# Patient Record
Sex: Female | Born: 1955 | Race: White | Hispanic: No | Marital: Married | Smoking: Never smoker
Health system: Southern US, Community
[De-identification: ages and names within clinical notes are randomized; demographics above are authoritative.]

## PROBLEM LIST (undated history)

## (undated) DIAGNOSIS — R7303 Prediabetes: Secondary | ICD-10-CM

## (undated) DIAGNOSIS — I10 Essential (primary) hypertension: Secondary | ICD-10-CM

## (undated) DIAGNOSIS — I4891 Unspecified atrial fibrillation: Secondary | ICD-10-CM

## (undated) DIAGNOSIS — E785 Hyperlipidemia, unspecified: Secondary | ICD-10-CM

## (undated) DIAGNOSIS — E559 Vitamin D deficiency, unspecified: Secondary | ICD-10-CM

## (undated) HISTORY — PX: OTHER SURGICAL HISTORY: SHX169

## (undated) HISTORY — PX: TUBAL LIGATION: SHX77

---

## 2005-01-15 ENCOUNTER — Other Ambulatory Visit: Admission: RE | Admit: 2005-01-15 | Discharge: 2005-01-15 | Payer: Self-pay | Admitting: Obstetrics and Gynecology

## 2009-07-26 ENCOUNTER — Encounter (INDEPENDENT_AMBULATORY_CARE_PROVIDER_SITE_OTHER): Payer: Self-pay | Admitting: *Deleted

## 2009-08-26 ENCOUNTER — Encounter (INDEPENDENT_AMBULATORY_CARE_PROVIDER_SITE_OTHER): Payer: Self-pay | Admitting: *Deleted

## 2009-08-29 ENCOUNTER — Ambulatory Visit: Payer: Self-pay | Admitting: Internal Medicine

## 2009-09-06 ENCOUNTER — Ambulatory Visit: Payer: Self-pay | Admitting: Internal Medicine

## 2009-09-12 ENCOUNTER — Encounter: Payer: Self-pay | Admitting: Internal Medicine

## 2010-07-20 NOTE — Letter (Signed)
Summary: Previsit letter  [facility]  [address] [place], [place] [address]   Phone: [phone]  Fax: [phone]       07/26/2009 MRN: [id number]  Madison Whitehead [address] [place], [place]  [address]  Dear Ms. Madison Whitehead,  Welcome to the Gastroenterology Division at [facility].    You are scheduled to see a nurse for your pre-procedure visit on 08-29-09 at 8:00a.m. on the 3rd floor at [facility], [address]  We ask that you try to arrive at our office 15 minutes prior to your appointment time to allow for check-in.  Your nurse visit will consist of discussing your medical and surgical history, your immediate family medical history, and your medications.    Please bring a complete list of all your medications or, if you prefer, bring the medication bottles and we will list them.  We will need to be aware of both prescribed and over the counter drugs.  We will need to know exact dosage information as well.  If you are on blood thinners (Coumadin, Plavix, Aggrenox, Ticlid, etc.) please call our office today/prior to your appointment, as we need to consult with your physician about holding your medication.   Please be prepared to read and sign documents such as consent forms, a financial agreement, and acknowledgement forms.  If necessary, and with your consent, a friend or relative is welcome to sit-in on the nurse visit with you.  Please bring your insurance card so that we may make a copy of it.  If your insurance requires a referral to see a specialist, please bring your referral form from your primary care physician.  No co-pay is required for this nurse visit.     If you cannot keep your appointment, please call [phone] to cancel or reschedule prior to your appointment date.  This allows [place] the opportunity to schedule an appointment for another patient in need of care.    Thank you for choosing [facility] for your medical  needs.  We appreciate the opportunity to care for you.  Please visit [place] at our website  to learn more about our practice.                     Sincerely.                                                                                                                   The Gastroenterology Division

## 2010-07-20 NOTE — Letter (Signed)
Summary: Patient Notice-Hyperplastic Polyps  [facility]  [address] [place], [place] [address]   Phone: [phone]  Fax: [phone]        September 12, 2009 MRN: [id number]    Madison Whitehead [address] [place], [place]  [address]    Dear Ms. Marvetta Gibbons,  I am pleased to inform you that the colon polyp removed during your recent colonoscopy was found to be hyperplastic.  These types of polyps are NOT pre-cancerous.  It is therefore my recommendation that you have a repeat colonoscopy examination in 10 years for routine colorectal cancer screening.  Should you develop new or worsening symptoms of abdominal pain, bowel habit changes or bleeding from the rectum or bowels, please schedule an evaluation with either your primary care physician or with me.  Please call us if you are having persistent problems or have questions about your condition that have not been fully answered at this time.   Sincerely,  Iva Boop MD, [facility] This letter has been electronically signed by your physician.  Appended Document: Patient Notice-Hyperplastic Polyps letter mailed 3.28.11

## 2010-07-20 NOTE — Procedures (Signed)
Summary: Colonoscopy  Patient: [name] Note: All result statuses are Final unless otherwise noted.  Tests: (1) Colonoscopy (COL)   COL Colonoscopy           DONE     [facility]     [address]     [place], [place]  [address]           COLONOSCOPY PROCEDURE REPORT           PATIENT:  Madison Whitehead, Madison Whitehead  MR#:  [id number]     BIRTHDATE:  04/15/1956, 53 yrs. old  GENDER:  female           ENDOSCOPIST:  Iva Boop, MD, [facility]     Referred by:  Marisue Brooklyn, D.O.           PROCEDURE DATE:  09/06/2009     PROCEDURE:  Colonoscopy with snare polypectomy     ASA CLASS:  Class II     INDICATIONS:  Routine Risk Screening           MEDICATIONS:   Fentanyl 75 mcg IV, Versed 8 mg IV           DESCRIPTION OF PROCEDURE:   After the risks benefits and     alternatives of the procedure were thoroughly explained, informed     consent was obtained.  Digital rectal exam was performed and     revealed no abnormalities.   The LB CF-[id number] endoscope     was introduced through the anus and advanced to the cecum, which     was identified by both the appendix and ileocecal valve, without     limitations.  The quality of the prep was excellent, using     MoviPrep.  The instrument was then slowly withdrawn as the colon     was fully examined.     Insertion: 5:24 minutes Withdrawal: 9:18 minutes     <<PROCEDUREIMAGES>>           FINDINGS:  A diminutive polyp was found in the left colon. It was     4 mm in size. Polyp was snared without cautery. Retrieval was     successful. snare polyp  This was otherwise a normal examination     of the colon.   Retroflexed views in the rectum revealed no     abnormalities.    The scope was then withdrawn from the patient     and the procedure completed.           COMPLICATIONS:  None           ENDOSCOPIC IMPRESSION:     1) 4 mm diminutive polyp in the left colon - removed     2) Otherwise normal examination            RECOMMENDATIONS:     1) Repeat colonoscopy in 5 years if polyp adenomatous; otherwise     10 years           REPEAT EXAM:  In for Colonoscopy, pending biopsy results.           Iva Boop, MD, [facility]           CC:  Marisue Brooklyn, DO     The Patient           n.     Rosalie Doctor:   Iva Boop at 09/06/2009 09:10 AM           [name], [id number]  Note:  An exclamation mark (!) indicates a result that was not dispersed into the flowsheet. Document Creation Date: 09/06/2009 9:10 AM _______________________________________________________________________  (1) Order result status: Final Collection or observation date-time: 09/06/2009 09:01 Requested date-time:  Receipt date-time:  Reported date-time:  Referring Physician:   Ordering Physician: Stan Head [phone]) Specimen Source:  Source: Launa Grill Order Number: [id number] Lab site:   Appended Document: Colonoscopy     Procedures Next Due Date:    Colonoscopy: 09/2019

## 2010-07-20 NOTE — Letter (Signed)
Summary: [facility] Instructions  [facility]  [address] [place], [place] [address]   Phone: [phone]  Fax: [phone]       Madison Whitehead    03/05/1956    MRN: [id number]        Procedure Day /Date:Tuesday 09/06/09     Arrival Time: 7:30 am     Procedure Time: 8:30 am     Location of Procedure:                    _x_  [facility] (4th Floor)   PREPARATION FOR COLONOSCOPY WITH MOVIPREP   Starting 5 days prior to your procedure 09/01/09 do not eat nuts, seeds, popcorn, corn, beans, peas,  salads, or any raw vegetables.  Do not take any fiber supplements (e.g. Metamucil, Citrucel, and Benefiber).  THE DAY BEFORE YOUR PROCEDURE         DATE:  09/05/09  DAY: monday  1.  Drink clear liquids the entire day-NO SOLID FOOD  2.  Do not drink anything colored red or purple.  Avoid juices with pulp.  No orange juice.  3.  Drink at least 64 oz. (8 glasses) of fluid/clear liquids during the day to prevent dehydration and help the prep work efficiently.  CLEAR LIQUIDS INCLUDE: Water Jello Ice Popsicles Tea (sugar ok, no milk/cream) Powdered fruit flavored drinks Coffee (sugar ok, no milk/cream) Gatorade Juice: apple, white grape, white cranberry  Lemonade Clear bullion, consomm, broth Carbonated beverages (any kind) Strained chicken noodle soup Hard Candy                             4.  In the morning, mix first dose of MoviPrep solution:    Empty 1 Pouch A and 1 Pouch B into the disposable container    Add lukewarm drinking water to the top line of the container. Mix to dissolve    Refrigerate (mixed solution should be used within 24 hrs)  5.  Begin drinking the prep at 5:00 p.m. The MoviPrep container is divided by 4 marks.   Every 15 minutes drink the solution down to the next mark (approximately 8 oz) until the full liter is complete.   6.  Follow completed prep with 16 oz of clear liquid of your choice (Nothing red or purple).   Continue to drink clear liquids until bedtime.  7.  Before going to bed, mix second dose of MoviPrep solution:    Empty 1 Pouch A and 1 Pouch B into the disposable container    Add lukewarm drinking water to the top line of the container. Mix to dissolve    Refrigerate  THE DAY OF YOUR PROCEDURE      DATE: 09/06/09  DAY: Tuesday  Beginning at  3:30 a.m. (5 hours before procedure):        1. Every 15 minutes, drink the solution down to the next mark (approx 8 oz) until the full liter is complete.  2. Follow completed prep with 16 oz. of clear liquid of your choice.    3. You may drink clear liquids until 6:30 am  (2 HOURS BEFORE PROCEDURE).   MEDICATION INSTRUCTIONS  Unless otherwise instructed, you should take regular prescription medications with a small sip of water   as early as possible the morning of your procedure.    Additional medication instructions: Hold HCTZ the morning of procedure.  OTHER INSTRUCTIONS  You will need a responsible adult at least 56 years of age to accompany you and drive you home.   This person must remain in the waiting room during your procedure.  Wear loose fitting clothing that is easily removed.  Leave jewelry and other valuables at home.  However, you may wish to bring a book to read or  an iPod/MP3 player to listen to music as you wait for your procedure to start.  Remove all body piercing jewelry and leave at home.  Total time from sign-in until discharge is approximately 2-3 hours.  You should go home directly after your procedure and rest.  You can resume normal activities the  day after your procedure.  The day of your procedure you should not:   Drive   Make legal decisions   Operate machinery   Drink alcohol   Return to work  You will receive specific instructions about eating, activities and medications before you leave.    The above instructions have been reviewed and explained to me by   Wyona Almas RN  March 56, 2011 8:22 AM     I fully understand and can verbalize these instructions _____________________________ Date _________

## 2010-07-20 NOTE — Miscellaneous (Signed)
Summary: lec pREVISIT/PREP  Clinical Lists Changes  Medications: Added new medication of MOVIPREP 100 GM  SOLR (PEG-KCL-NACL-NASULF-NA ASC-C) As per prep instructions. - Signed Rx of MOVIPREP 100 GM  SOLR (PEG-KCL-NACL-NASULF-NA ASC-C) As per prep instructions.;  #1 x 0;  Signed;  Entered by: Wyona Almas RN;  Authorized by: Iva Boop MD, [facility];  Method used: [facility]  [id number] [address], [place], [place], [place]  [address], Ph: [phone], Fax: [phone] Allergies: Added new allergy or adverse reaction of PENICILLIN Added new allergy or adverse reaction of CODEINE Observations: Added new observation of NKA: F (08/29/2009 7:54)    Prescriptions: MOVIPREP 100 GM  SOLR (PEG-KCL-NACL-NASULF-NA ASC-C) As per prep instructions.  #1 x 0   Entered by:   Wyona Almas RN   Authorized by:   Iva Boop MD, [facility]   Signed by:   Wyona Almas RN on 08/29/2009   Method used:   [facility]  [address] [id number]       [address]       [place], [place]  [address]       Ph: [phone]       Fax: [phone]   RxID:   [id number]

## 2010-09-18 ENCOUNTER — Inpatient Hospital Stay (INDEPENDENT_AMBULATORY_CARE_PROVIDER_SITE_OTHER)
Admission: RE | Admit: 2010-09-18 | Discharge: 2010-09-18 | Disposition: A | Discharge status: Home / Self Care | Payer: BC Managed Care – PPO | Source: Ambulatory Visit | Attending: Family Medicine | Admitting: Family Medicine

## 2010-09-18 DIAGNOSIS — T148XXA Other injury of unspecified body region, initial encounter: Secondary | ICD-10-CM

## 2010-09-28 ENCOUNTER — Inpatient Hospital Stay (HOSPITAL_COMMUNITY)
Admission: RE | Admit: 2010-09-28 | Discharge: 2010-09-28 | Disposition: A | Discharge status: Home / Self Care | Payer: BC Managed Care – PPO | Source: Ambulatory Visit

## 2011-07-05 ENCOUNTER — Other Ambulatory Visit: Payer: Self-pay | Admitting: Obstetrics and Gynecology

## 2011-07-12 ENCOUNTER — Other Ambulatory Visit: Payer: Self-pay | Admitting: Obstetrics and Gynecology

## 2011-07-12 DIAGNOSIS — R928 Other abnormal and inconclusive findings on diagnostic imaging of breast: Secondary | ICD-10-CM

## 2011-07-19 ENCOUNTER — Ambulatory Visit
Admission: RE | Admit: 2011-07-19 | Discharge: 2011-07-19 | Disposition: A | Discharge status: Home / Self Care | Payer: BC Managed Care – PPO | Source: Ambulatory Visit | Attending: Obstetrics and Gynecology | Admitting: Obstetrics and Gynecology

## 2011-07-19 DIAGNOSIS — R928 Other abnormal and inconclusive findings on diagnostic imaging of breast: Secondary | ICD-10-CM

## 2013-06-05 ENCOUNTER — Encounter: Payer: Self-pay | Admitting: Internal Medicine

## 2015-04-22 ENCOUNTER — Encounter: Payer: Self-pay | Admitting: Internal Medicine

## 2015-07-05 ENCOUNTER — Other Ambulatory Visit: Payer: Self-pay | Admitting: Obstetrics and Gynecology

## 2015-07-06 LAB — CYTOLOGY - PAP

## 2015-08-05 ENCOUNTER — Encounter: Payer: Self-pay | Admitting: Internal Medicine

## 2015-08-05 ENCOUNTER — Ambulatory Visit (INDEPENDENT_AMBULATORY_CARE_PROVIDER_SITE_OTHER): Payer: BLUE CROSS/BLUE SHIELD | Admitting: Internal Medicine

## 2015-08-05 VITALS — BP 152/98 | HR 64 | Temp 97.0°F | Resp 16 | Ht 66.25 in | Wt 178.3 lb

## 2015-08-05 DIAGNOSIS — Z111 Encounter for screening for respiratory tuberculosis: Secondary | ICD-10-CM

## 2015-08-05 DIAGNOSIS — Z0000 Encounter for general adult medical examination without abnormal findings: Secondary | ICD-10-CM

## 2015-08-05 DIAGNOSIS — E559 Vitamin D deficiency, unspecified: Secondary | ICD-10-CM | POA: Diagnosis not present

## 2015-08-05 DIAGNOSIS — I10 Essential (primary) hypertension: Secondary | ICD-10-CM | POA: Diagnosis not present

## 2015-08-05 DIAGNOSIS — Z79899 Other long term (current) drug therapy: Secondary | ICD-10-CM | POA: Diagnosis not present

## 2015-08-05 DIAGNOSIS — E782 Mixed hyperlipidemia: Secondary | ICD-10-CM | POA: Insufficient documentation

## 2015-08-05 DIAGNOSIS — R5383 Other fatigue: Secondary | ICD-10-CM

## 2015-08-05 DIAGNOSIS — R7303 Prediabetes: Secondary | ICD-10-CM

## 2015-08-05 DIAGNOSIS — Z1212 Encounter for screening for malignant neoplasm of rectum: Secondary | ICD-10-CM

## 2015-08-05 DIAGNOSIS — Z0001 Encounter for general adult medical examination with abnormal findings: Secondary | ICD-10-CM

## 2015-08-05 DIAGNOSIS — R7309 Other abnormal glucose: Secondary | ICD-10-CM | POA: Insufficient documentation

## 2015-08-05 DIAGNOSIS — E785 Hyperlipidemia, unspecified: Secondary | ICD-10-CM

## 2015-08-05 LAB — BASIC METABOLIC PANEL WITH GFR
BUN: 19 mg/dL (ref 7–25)
CHLORIDE: 103 mmol/L (ref 98–110)
CO2: 27 mmol/L (ref 20–31)
CREATININE: 0.92 mg/dL (ref 0.50–0.99)
Calcium: 9.8 mg/dL (ref 8.6–10.4)
GFR, EST AFRICAN AMERICAN: 78 mL/min (ref >=60)
GFR, Est Non African American: 68 mL/min (ref >=60)
Glucose, Bld: 85 mg/dL (ref 65–99)
Potassium: 4.3 mmol/L (ref 3.5–5.3)
SODIUM: 139 mmol/L (ref 135–146)

## 2015-08-05 LAB — CBC WITH DIFFERENTIAL/PLATELET
Basophils Absolute: 0 10*3/uL (ref 0.0–0.1)
Basophils Relative: 0 % (ref 0–1)
Eosinophils Absolute: 0.1 10*3/uL (ref 0.0–0.7)
Eosinophils Relative: 2 % (ref 0–5)
HEMATOCRIT: 40.9 % (ref 36.0–46.0)
HEMOGLOBIN: 13.9 g/dL (ref 12.0–15.0)
LYMPHS ABS: 2.3 10*3/uL (ref 0.7–4.0)
LYMPHS PCT: 40 % (ref 12–46)
MCH: 31.5 pg (ref 26.0–34.0)
MCHC: 34 g/dL (ref 30.0–36.0)
MCV: 92.7 fL (ref 78.0–100.0)
MONO ABS: 0.6 10*3/uL (ref 0.1–1.0)
MONOS PCT: 10 % (ref 3–12)
MPV: 10.3 fL (ref 8.6–12.4)
Neutro Abs: 2.7 10*3/uL (ref 1.7–7.7)
Neutrophils Relative %: 48 % (ref 43–77)
Platelets: 226 10*3/uL (ref 150–400)
RBC: 4.41 MIL/uL (ref 3.87–5.11)
RDW: 13.5 % (ref 11.5–15.5)
WBC: 5.7 10*3/uL (ref 4.0–10.5)

## 2015-08-05 LAB — HEMOGLOBIN A1C
Hgb A1c MFr Bld: 5.6 % (ref <5.7)
Mean Plasma Glucose: 114 mg/dL (ref <117)

## 2015-08-05 LAB — LIPID PANEL
CHOL/HDL RATIO: 4.1 ratio (ref <=5.0)
CHOLESTEROL: 278 mg/dL — AB (ref 125–200)
HDL: 68 mg/dL (ref >=46)
LDL Cholesterol: 193 mg/dL — ABNORMAL HIGH (ref <130)
TRIGLYCERIDES: 86 mg/dL (ref <150)
VLDL: 17 mg/dL (ref <30)

## 2015-08-05 LAB — IRON AND TIBC
%SAT: 26 % (ref 11–50)
IRON: 85 ug/dL (ref 45–160)
TIBC: 332 ug/dL (ref 250–450)
UIBC: 247 ug/dL (ref 125–400)

## 2015-08-05 LAB — HEPATIC FUNCTION PANEL
ALT: 17 U/L (ref 6–29)
AST: 23 U/L (ref 10–35)
Albumin: 4.6 g/dL (ref 3.6–5.1)
Alkaline Phosphatase: 101 U/L (ref 33–130)
BILIRUBIN DIRECT: 0.1 mg/dL (ref <=0.2)
BILIRUBIN TOTAL: 0.6 mg/dL (ref 0.2–1.2)
Indirect Bilirubin: 0.5 mg/dL (ref 0.2–1.2)
Total Protein: 7.3 g/dL (ref 6.1–8.1)

## 2015-08-05 LAB — VITAMIN B12: Vitamin B-12: 390 pg/mL (ref 200–1100)

## 2015-08-05 LAB — MAGNESIUM: MAGNESIUM: 2 mg/dL (ref 1.5–2.5)

## 2015-08-05 LAB — TSH: TSH: 1.41 mIU/L

## 2015-08-05 NOTE — Progress Notes (Signed)
Patient ID: Madison Whitehead, female   DOB: February 03, 1956, 60 y.o.   MRN: [id number]  Annual Screening/Preventative Visit And Comprehensive Evaluation &  Examination  This very nice 60 y.o. MWF presents for presents for a Wellness/Preventative Visit & comprehensive evaluation and management of multiple medical co-morbidities.  Patient has been followed for HTN,Hyperlipidemia, Vitamin D Deficiency& screened for Prediabetes, .    Labile HTN predates since 2007 and then was treated with HCTZ in 2009 which she later self-d/c'd because of her medication phobias. Patient's BP has not been monitored at home and patient was last seen here about 3 years ago. patient denies any cardiac symptoms as chest pain, palpitations, shortness of breath, dizziness or ankle swelling. Today's BP is 152/98    Patient's hyperlipidemia was controlled with diet & Crestor which she self d/c'd. Patient denies myalgias or other medication SE's. Last lipids were TC 181, TG 121, HDL 61 & LDL 96 in Sept 2014.    Patient has is screened proactively for  Prediabetes and patient denies reactive hypoglycemic symptoms, visual blurring, diabetic polys, or paresthesias. Last A1c was 5.4% in Sept 2014.   Finally, patient has history of Vitamin D Deficiency of "10" in 2008 and last Vitamin D was 56 in Mar 2014.     No current outpatient prescriptions on file prior to visit.   No current facility-administered medications on file prior to visit.   Allergies  Allergen Reactions  . Codeine     REACTION: TREMORS/NERVOUS  . Penicillins     REACTION: RASH/SWELLING   No past medical history on file. Health Maintenance  Topic Date Due  . Hepatitis C Screening  09/12/1955  . HIV Screening  04/11/1970  . TETANUS/TDAP  04/11/1974  . MAMMOGRAM  07/18/2013  . INFLUENZA VACCINE  01/17/2015  . ZOSTAVAX  04/12/2015  . PAP SMEAR  07/04/2018  . COLONOSCOPY  09/07/2019    There is no immunization history on file for this patient. No past  surgical history on file. No family history on file. Social History  Substance Use Topics  . Smoking status: Never Smoker   . Smokeless tobacco: None  . Alcohol Use: 0.0 oz/week    0 Standard drinks or equivalent per week     Comment: social    ROS Constitutional: Denies fever, chills, weight loss/gain, headaches, insomnia,  night sweats, and change in appetite. Does c/o fatigue. Eyes: Denies redness, blurred vision, diplopia, discharge, itchy, watery eyes.  ENT: Denies discharge, congestion, post nasal drip, epistaxis, sore throat, earache, hearing loss, dental pain, Tinnitus, Vertigo, Sinus pain, snoring.  Cardio: Denies chest pain, palpitations, irregular heartbeat, syncope, dyspnea, diaphoresis, orthopnea, PND, claudication, edema Respiratory: denies cough, dyspnea, DOE, pleurisy, hoarseness, laryngitis, wheezing.  Gastrointestinal: Denies dysphagia, heartburn, reflux, water brash, pain, cramps, nausea, vomiting, bloating, diarrhea, constipation, hematemesis, melena, hematochezia, jaundice, hemorrhoids Genitourinary: Denies dysuria, frequency, urgency, nocturia, hesitancy, discharge, hematuria, flank pain Breast: Breast lumps, nipple discharge, bleeding.  Musculoskeletal: Denies arthralgia, myalgia, stiffness, Jt. Swelling, pain, limp, and strain/sprain. Denies falls. Skin: Denies puritis, rash, hives, warts, acne, eczema, changing in skin lesion Neuro: No weakness, tremor, incoordination, spasms, paresthesia, pain Psychiatric: Denies confusion, memory loss, sensory loss. Denies Depression. Endocrine: Denies change in weight, skin, hair change, nocturia, and paresthesia, diabetic polys, visual blurring, hyper / hypo glycemic episodes.  Heme/Lymph: No excessive bleeding, bruising, enlarged lymph nodes.  Physical Exam  BP 152/98 mmHg  Pulse 64  Temp(Src) 97 F (36.1 C)  Resp 16  Ht 5' 6.25" (1.683 m)  [employer]  178 lb 4.8 oz (80.876 kg)  BMI 28.55 kg/m2  General Appearance: Well  nourished and in no apparent distress. Eyes: PERRLA, EOMs, conjunctiva no swelling or erythema, normal fundi and vessels. Sinuses: No frontal/maxillary tenderness ENT/Mouth: EACs patent / TMs  nl. Nares clear without erythema, swelling, mucoid exudates. Oral hygiene is good. No erythema, swelling, or exudate. Tongue normal, non-obstructing. Tonsils not swollen or erythematous. Hearing normal.  Neck: Supple, thyroid normal. No bruits, nodes or JVD. Respiratory: Respiratory effort normal.  BS equal and clear bilateral without rales, rhonci, wheezing or stridor. Cardio: Heart sounds are normal with regular rate and rhythm and no murmurs, rubs or gallops. Peripheral pulses are normal and equal bilaterally without edema. No aortic or femoral bruits. Chest: symmetric with normal excursions and percussion. Breasts: Symmetric, without lumps, nipple discharge, retractions, or fibrocystic changes.  Abdomen: Flat, soft, with bowl sounds. Nontender, no guarding, rebound, hernias, masses, or organomegaly.  Lymphatics: Non tender without lymphadenopathy.  Genitourinary:  Musculoskeletal: Full ROM all peripheral extremities, joint stability, 5/5 strength, and normal gait. Skin: Warm and dry without rashes, lesions, cyanosis, clubbing or  ecchymosis.  Neuro: Cranial nerves intact, reflexes equal bilaterally. Normal muscle tone, no cerebellar symptoms. Sensation intact.  Pysch: Alert and oriented X 3, normal affect, Insight and Judgment appropriate.   Assessment and Plan  1. Annual Preventative Screening Examination  - Cholecalciferol (VITAMIN D PO); Take 5,000 Units by mouth 2 (two) times daily. - Microalbumin / creatinine urine ratio - EKG 12-Lead - [place], RETROPERITNL ABD,  LTD - POC Hemoccult Bld/Stl  - Urinalysis, Routine w reflex microscopic  - Vitamin B12 - Iron and TIBC - CBC with Differential/Platelet - BASIC METABOLIC PANEL WITH GFR - Hepatic function panel - Magnesium - Lipid panel - TSH -  Hemoglobin A1c - Insulin, random - VITAMIN D 25 Hydroxy  - PPD  2. Essential hypertension  - Microalbumin / creatinine urine ratio - EKG 12-Lead - [place], RETROPERITNL ABD,  LTD - TSH  3. Hyperlipidemia  - Lipid panel - TSH  4. Prediabetes  - Hemoglobin A1c - Insulin, random  5. Vitamin D deficiency  - VITAMIN D 25 Hydroxy   6. Screening for rectal cancer  - POC Hemoccult Bld/Stl   7. Other fatigue  - Vitamin B12 - Iron and TIBC - CBC with Differential/Platelet - TSH  8. Screening examination for pulmonary tuberculosis  - PPD  9. Medication management  - Urinalysis, Routine w reflex microscopic  - CBC with Differential/Platelet - BASIC METABOLIC PANEL WITH GFR - Hepatic function panel - Magnesium    Continue prudent diet as discussed, weight control, BP monitoring, regular exercise, and medications. Discussed med's effects and SE's. Screening labs and tests as requested with regular follow-up as recommended. Over 40 minutes of exam, counseling, chart review and high complex critical decision making was performed.

## 2015-08-05 NOTE — Patient Instructions (Signed)
Recommend Adult Low Dose Aspirin or   coated  Aspirin 81 mg daily   To reduce risk of Colon Cancer 20 %,   Skin Cancer 26 % ,   Melanoma 46%   and   Pancreatic cancer 60%   ++++++++++++++++++++++++++++++++++++++++++++++++++++++ Vitamin D goal   is between 70-100.   Please make sure that you are taking your Vitamin D as directed.   It is very important as a natural anti-inflammatory   helping hair, skin, and nails, as well as reducing stroke and heart attack risk.   It helps your bones and helps with mood.  It also decreases numerous cancer risks so please take it as directed.   Low Vit D is associated with a 200-300% higher risk for CANCER   and 200-300% higher risk for HEART   ATTACK  &  STROKE.   .....................................Marland Kitchen  It is also associated with higher death rate at younger ages,   autoimmune diseases like Rheumatoid arthritis, Lupus, Multiple Sclerosis.     Also many other serious conditions, like depression, Alzheimer's  Dementia, infertility, muscle aches, fatigue, fibromyalgia - just to name a few.  ++++++++++++++++++++++++++++++++++++++++++++++++  Recommend the book "The END of DIETING" by Dr Excell Seltzer   & the book "The END of DIABETES " by Dr Excell Seltzer  At [email or web address] - get book & Audio CD's     Being diabetic has a  300% increased risk for heart attack, stroke, cancer, and alzheimer- type vascular dementia. It is very important that you work harder with diet by avoiding all foods that are white. Avoid white rice (brown & wild rice is OK), white potatoes (sweetpotatoes in moderation is OK), White bread or wheat bread or anything made out of white flour like bagels, donuts, rolls, buns, biscuits, cakes, pastries, cookies, pizza crust, and pasta (made from white flour & egg whites) - vegetarian pasta or spinach or wheat pasta is OK. Multigrain breads like Arnold's or Pepper[employer], or multigrain sandwich thins or flatbreads.  Diet,  exercise and weight loss can reverse and cure diabetes in the early stages.  Diet, exercise and weight loss is very important in the control and prevention of complications of diabetes which affects every system in your body, ie. Brain - dementia/stroke, eyes - glaucoma/blindness, heart - heart attack/heart failure, kidneys - dialysis, stomach - gastric paralysis, intestines - malabsorption, nerves - severe painful neuritis, circulation - gangrene & loss of a leg(s), and finally cancer and Alzheimers.    I recommend avoid fried & greasy foods,  sweets/candy, white rice (brown or wild rice or Quinoa is OK), white potatoes (sweet potatoes are OK) - anything made from white flour - bagels, doughnuts, rolls, buns, biscuits,white and wheat breads, pizza crust and traditional pasta made of white flour & egg white(vegetarian pasta or spinach or wheat pasta is OK).  Multi-grain bread is OK - like multi-grain flat bread or sandwich thins. Avoid alcohol in excess. Exercise is also important.    Eat all the vegetables you want - avoid meat, especially red meat and dairy - especially cheese.  Cheese is the most concentrated form of trans-fats which is the worst thing to clog up our arteries. Veggie cheese is OK which can be found in the fresh produce section at [employer] or [employer] or [employer]  ++++++++++++++++++++++++++++++++++++++++++++++++++ DASH Eating Plan  DASH stands for "Dietary Approaches to Stop Hypertension."   The DASH eating plan is a healthy eating plan that has been shown to reduce high blood  pressure (hypertension). Additional health benefits may include reducing the risk of type 2 diabetes mellitus, heart disease, and stroke. The DASH eating plan may also help with weight loss.  WHAT DO I NEED TO KNOW ABOUT THE DASH EATING PLAN?  For the DASH eating plan, you will follow these general guidelines:  Choose foods with a percent daily value for sodium of less than 5% (as listed on the food  label).  Use salt-free seasonings or herbs instead of table salt or sea salt.  Check with your health care provider or pharmacist before using salt substitutes.  Eat lower-sodium products, often labeled as "lower sodium" or "no salt added."  Eat fresh foods.  Eat more vegetables, fruits, and low-fat dairy products.    Choose whole grains. Look for the word "whole" as the first word in the ingredient list.  Choose fish   Limit sweets, desserts, sugars, and sugary drinks.  Choose heart-healthy fats.  Eat veggie cheese   Eat more home-cooked food and less restaurant, buffet, and fast food.  Limit fried foods.  [place]uffaker foods using methods other than frying.  Limit canned vegetables. If you do use them, rinse them well to decrease the sodium.  When eating at a restaurant, ask that your food be prepared with less salt, or no salt if possible.                      WHAT FOODS CAN I EAT?  Read Dr Fara Olden Fuhrman's books on The End of Dieting & The End of Diabetes  Grains  Whole grain or whole wheat bread. Brown rice. Whole grain or whole wheat pasta. Quinoa, bulgur, and whole grain cereals. Low-sodium cereals. Corn or whole wheat flour tortillas. Whole grain cornbread. Whole grain crackers. Low-sodium crackers.  Vegetables  Fresh or frozen vegetables (raw, steamed, roasted, or grilled). Low-sodium or reduced-sodium tomato and vegetable juices. Low-sodium or reduced-sodium tomato sauce and paste. Low-sodium or reduced-sodium canned vegetables.   Fruits  All fresh, canned (in natural juice), or frozen fruits.  Protein Products   All fish and seafood.  Dried beans, peas, or lentils. Unsalted nuts and seeds. Unsalted canned beans.  Dairy  Low-fat dairy products, such as skim or 1% milk, 2% or reduced-fat cheeses, low-fat ricotta or cottage cheese, or plain low-fat yogurt. Low-sodium or reduced-sodium cheeses.  Fats and Oils  Tub margarines without trans fats. Light or  reduced-fat mayonnaise and salad dressings (reduced sodium). Avocado. Safflower, olive, or canola oils. Natural peanut or almond butter.  Other  Unsalted popcorn and pretzels. The items listed above may not be a complete list of recommended foods or beverages. Contact your dietitian for more options.  +++++++++++++++++++++++++++++++++++++++++++  WHAT FOODS ARE NOT RECOMMENDED?  Grains/ White flour or wheat flour  White bread. White pasta. White rice. Refined cornbread. Bagels and croissants. Crackers that contain trans fat.  Vegetables  Creamed or fried vegetables. Vegetables in a . Regular canned vegetables. Regular canned tomato sauce and paste. Regular tomato and vegetable juices.  Fruits  Dried fruits. Canned fruit in light or heavy syrup. Fruit juice.  Meat and Other Protein Products  Meat in general - RED mwaet & White meat.  Fatty cuts of meat. Ribs, chicken wings, bacon, sausage, bologna, salami, chitterlings, fatback, hot dogs, bratwurst, and packaged luncheon meats.  Dairy  Whole or 2% milk, cream, half-and-half, and cream cheese. Whole-fat or sweetened yogurt. Full-fat cheeses or blue cheese. Nondairy creamers and whipped toppings. Processed cheese, cheese spreads, or  cheese curds.  Condiments  Onion and garlic salt, seasoned salt, table salt, and sea salt. Canned and packaged gravies. Worcestershire sauce. Tartar sauce. Barbecue sauce. Teriyaki sauce. Soy sauce, including reduced sodium. Steak sauce. Fish sauce. Oyster sauce. Cocktail sauce. Horseradish. Ketchup and mustard. Meat flavorings and tenderizers. Bouillon cubes. Hot sauce. Tabasco sauce. Marinades. Taco seasonings. Relishes.  Fats and Oils Butter, stick margarine, lard, shortening and bacon fat. Coconut, palm kernel, or palm oils. Regular salad dressings.  Pickles and olives. Salted popcorn and pretzels.  The items listed above may not be a complete list of foods and beverages to avoid.   Preventive  Care for Adults  A healthy lifestyle and preventive care can promote health and wellness. Preventive health guidelines for women include the following key practices.  A routine yearly physical is a good way to check with your health care provider about your health and preventive screening. It is a chance to share any concerns and updates on your health and to receive a thorough exam.  Visit your dentist for a routine exam and preventive care every 6 months. Brush your teeth twice a day and floss once a day. Good oral hygiene prevents tooth decay and gum disease.  The frequency of eye exams is based on your age, health, family medical history, use of contact lenses, and other factors. Follow your health care provider's recommendations for frequency of eye exams.  Eat a healthy diet. Foods like vegetables, fruits, whole grains, low-fat dairy products, and lean protein foods contain the nutrients you need without too many calories. Decrease your intake of foods high in solid fats, added sugars, and salt. Eat the right amount of calories for you.Get information about a proper diet from your health care provider, if necessary.  Regular physical exercise is one of the most important things you can do for your health. Most adults should get at least 150 minutes of moderate-intensity exercise (any activity that increases your heart rate and causes you to sweat) each week. In addition, most adults need muscle-strengthening exercises on 2 or more days a week.  Maintain a healthy weight. The body mass index (BMI) is a screening tool to identify possible weight problems. It provides an estimate of body fat based on height and weight. Your health care provider can find your BMI and can help you achieve or maintain a healthy weight.For adults 20 years and older:  A BMI below 18.5 is considered underweight.  A BMI of 18.5 to 24.9 is normal.  A BMI of 25 to 29.9 is considered overweight.  A BMI of 30 and  above is considered obese.  Maintain normal blood lipids and cholesterol levels by exercising and minimizing your intake of saturated fat. Eat a balanced diet with plenty of fruit and vegetables. Blood tests for lipids and cholesterol should begin at age 21 and be repeated every 5 years. If your lipid or cholesterol levels are high, you are over 50, or you are at high risk for heart disease, you may need your cholesterol levels checked more frequently.Ongoing high lipid and cholesterol levels should be treated with medicines if diet and exercise are not working.  If you smoke, find out from your health care provider how to quit. If you do not use tobacco, do not start.  Lung cancer screening is recommended for adults aged 31-80 years who are at high risk for developing lung cancer because of a history of smoking. A yearly low-dose CT scan of the lungs is recommended  for people who have at least a 30-pack-year history of smoking and are a current smoker or have quit within the past 15 years. A pack year of smoking is smoking an average of 1 pack of cigarettes a day for 1 year (for example: 1 pack a day for 30 years or 2 packs a day for 15 years). Yearly screening should continue until the smoker has stopped smoking for at least 15 years. Yearly screening should be stopped for people who develop a health problem that would prevent them from having lung cancer treatment.  High blood pressure causes heart disease and increases the risk of stroke. Your blood pressure should be checked at least every 1 to 2 years. Ongoing high blood pressure should be treated with medicines if weight loss and exercise do not work.  If you are 30-80 years old, ask your health care provider if you should take aspirin to prevent strokes.  Diabetes screening involves taking a blood sample to check your fasting blood sugar level. This should be done once every 3 years, after age 61, if you are within normal weight and without risk  factors for diabetes. Testing should be considered at a younger age or be carried out more frequently if you are overweight and have at least 1 risk factor for diabetes.  Breast cancer screening is essential preventive care for women. You should practice "breast self-awareness." This means understanding the normal appearance and feel of your breasts and may include breast self-examination. Any changes detected, no matter how small, should be reported to a health care provider. Women in their 77s and 30s should have a clinical breast exam (CBE) by a health care provider as part of a regular health exam every 1 to 3 years. After age 81, women should have a CBE every year. Starting at age 20, women should consider having a mammogram (breast X-ray test) every year. Women who have a family history of breast cancer should talk to their health care provider about genetic screening. Women at a high risk of breast cancer should talk to their health care providers about having an MRI and a mammogram every year.  Breast cancer gene (BRCA)-related cancer risk assessment is recommended for women who have family members with BRCA-related cancers. BRCA-related cancers include breast, ovarian, tubal, and peritoneal cancers. Having family members with these cancers may be associated with an increased risk for harmful changes (mutations) in the breast cancer genes BRCA1 and BRCA2. Results of the assessment will determine the need for genetic counseling and BRCA1 and BRCA2 testing.  Routine pelvic exams to screen for cancer are no longer recommended for nonpregnant women who are considered low risk for cancer of the pelvic organs (ovaries, uterus, and vagina) and who do not have symptoms. Ask your health care provider if a screening pelvic exam is right for you.  If you have had past treatment for cervical cancer or a condition that could lead to cancer, you need Pap tests and screening for cancer for at least 20 years after  your treatment. If Pap tests have been discontinued, your risk factors (such as having a new sexual partner) need to be reassessed to determine if screening should be resumed. Some women have medical problems that increase the chance of getting cervical cancer. In these cases, your health care provider may recommend more frequent screening and Pap tests.  Colorectal cancer can be detected and often prevented. Most routine colorectal cancer screening begins at the age of 22 years and  continues through age 74 years. However, your health care provider may recommend screening at an earlier age if you have risk factors for colon cancer. On a yearly basis, your health care provider may provide home test kits to check for hidden blood in the stool. Use of a small camera at the end of a tube, to directly examine the colon (sigmoidoscopy or colonoscopy), can detect the earliest forms of colorectal cancer. Talk to your health care provider about this at age 22, when routine screening begins. Direct exam of the colon should be repeated every 5-10 years through age 76 years, unless early forms of pre-cancerous polyps or small growths are found.  Hepatitis C blood testing is recommended for all people born from [birth year] through 1966 and any individual with known risks for hepatitis C.  Pra  Osteoporosis is a disease in which the bones lose minerals and strength with aging. This can result in serious bone fractures or breaks. The risk of osteoporosis can be identified using a bone density scan. Women ages 88 years and over and women at risk for fractures or osteoporosis should discuss screening with their health care providers. Ask your health care provider whether you should take a calcium supplement or vitamin D to reduce the rate of osteoporosis.  Menopause can be associated with physical symptoms and risks. Hormone replacement therapy is available to decrease symptoms and risks. You should talk to your health care  provider about whether hormone replacement therapy is right for you.  Use sunscreen. Apply sunscreen liberally and repeatedly throughout the day. You should seek shade when your shadow is shorter than you. Protect yourself by wearing long sleeves, pants, a wide-brimmed hat, and sunglasses year round, whenever you are outdoors.  Once a month, do a whole body skin exam, using a mirror to look at the skin on your back. Tell your health care provider of new moles, moles that have irregular borders, moles that are larger than a pencil eraser, or moles that have changed in shape or color.  Stay current with required vaccines (immunizations).  Influenza vaccine. All adults should be immunized every year.  Tetanus, diphtheria, and acellular pertussis (Td, Tdap) vaccine. Pregnant women should receive 1 dose of Tdap vaccine during each pregnancy. The dose should be obtained regardless of the length of time since the last dose. Immunization is preferred during the 27th-36th week of gestation. An adult who has not previously received Tdap or who does not know her vaccine status should receive 1 dose of Tdap. This initial dose should be followed by tetanus and diphtheria toxoids (Td) booster doses every 10 years. Adults with an unknown or incomplete history of completing a 3-dose immunization series with Td-containing vaccines should begin or complete a primary immunization series including a Tdap dose. Adults should receive a Td booster every 10 years.  Varicella vaccine. An adult without evidence of immunity to varicella should receive 2 doses or a second dose if she has previously received 1 dose. Pregnant females who do not have evidence of immunity should receive the first dose after pregnancy. This first dose should be obtained before leaving the health care facility. The second dose should be obtained 4-8 weeks after the first dose.  Human papillomavirus (HPV) vaccine. Females aged 13-26 years who have not  received the vaccine previously should obtain the 3-dose series. The vaccine is not recommended for use in pregnant females. However, pregnancy testing is not needed before receiving a dose. If a female is found to  be pregnant after receiving a dose, no treatment is needed. In that case, the remaining doses should be delayed until after the pregnancy. Immunization is recommended for any person with an immunocompromised condition through the age of 14 years if she did not get any or all doses earlier. During the 3-dose series, the second dose should be obtained 4-8 weeks after the first dose. The third dose should be obtained 24 weeks after the first dose and 16 weeks after the second dose.  Zoster vaccine. One dose is recommended for adults aged 87 years or older unless certain conditions are present.  Measles, mumps, and rubella (MMR) vaccine. Adults born before [birth year] generally are considered immune to measles and mumps. Adults born in [birth year] or later should have 1 or more doses of MMR vaccine unless there is a contraindication to the vaccine or there is laboratory evidence of immunity to each of the three diseases. A routine second dose of MMR vaccine should be obtained at least 28 days after the first dose for students attending postsecondary schools, health care workers, or international travelers. People who received inactivated measles vaccine or an unknown type of measles vaccine during 1963-1967 should receive 2 doses of MMR vaccine. People who received inactivated mumps vaccine or an unknown type of mumps vaccine before 1979 and are at high risk for mumps infection should consider immunization with 2 doses of MMR vaccine. For females of childbearing age, rubella immunity should be determined. If there is no evidence of immunity, females who are not pregnant should be vaccinated. If there is no evidence of immunity, females who are pregnant should delay immunization until after pregnancy. Unvaccinated  health care workers born before [birth year] who lack laboratory evidence of measles, mumps, or rubella immunity or laboratory confirmation of disease should consider measles and mumps immunization with 2 doses of MMR vaccine or rubella immunization with 1 dose of MMR vaccine.  Pneumococcal 13-valent conjugate (PCV13) vaccine. When indicated, a person who is uncertain of her immunization history and has no record of immunization should receive the PCV13 vaccine. An adult aged 48 years or older who has certain medical conditions and has not been previously immunized should receive 1 dose of PCV13 vaccine. This PCV13 should be followed with a dose of pneumococcal polysaccharide (PPSV23) vaccine. The PPSV23 vaccine dose should be obtained at least 8 weeks after the dose of PCV13 vaccine. An adult aged 40 years or older who has certain medical conditions and previously received 1 or more doses of PPSV23 vaccine should receive 1 dose of PCV13. The PCV13 vaccine dose should be obtained 1 or more years after the last PPSV23 vaccine dose.    Pneumococcal polysaccharide (PPSV23) vaccine. When PCV13 is also indicated, PCV13 should be obtained first. All adults aged 66 years and older should be immunized. An adult younger than age 28 years who has certain medical conditions should be immunized. Any person who resides in a nursing home or long-term care facility should be immunized. An adult smoker should be immunized. People with an immunocompromised condition and certain other conditions should receive both PCV13 and PPSV23 vaccines. People with human immunodeficiency virus (HIV) infection should be immunized as soon as possible after diagnosis. Immunization during chemotherapy or radiation therapy should be avoided. Routine use of PPSV23 vaccine is not recommended for American Indians, [place] Natives, or people younger than 65 years unless there are medical conditions that require PPSV23 vaccine. When indicated, people who  have unknown immunization and have no record of  immunization should receive PPSV23 vaccine. One-time revaccination 5 years after the first dose of PPSV23 is recommended for people aged 19-64 years who have chronic kidney failure, nephrotic syndrome, asplenia, or immunocompromised conditions. People who received 1-2 doses of PPSV23 before age 15 years should receive another dose of PPSV23 vaccine at age 40 years or later if at least 5 years have passed since the previous dose. Doses of PPSV23 are not needed for people immunized with PPSV23 at or after age 68 years.  Preventive Services / Frequency   Ages 67 to 44 years  Blood pressure check.  Lipid and cholesterol check.  Lung cancer screening. / Every year if you are aged 37-80 years and have a 30-pack-year history of smoking and currently smoke or have quit within the past 15 years. Yearly screening is stopped once you have quit smoking for at least 15 years or develop a health problem that would prevent you from having lung cancer treatment.  Clinical breast exam.** / Every year after age 56 years.  BRCA-related cancer risk assessment.** / For women who have family members with a BRCA-related cancer (breast, ovarian, tubal, or peritoneal cancers).  Mammogram.** / Every year beginning at age 45 years and continuing for as long as you are in good health. Consult with your health care provider.  Pap test.** / Every 3 years starting at age 57 years through age 39 or 44 years with a history of 3 consecutive normal Pap tests.  HPV screening.** / Every 3 years from ages 39 years through ages 21 to 65 years with a history of 3 consecutive normal Pap tests.  Fecal occult blood test (FOBT) of stool. / Every year beginning at age 80 years and continuing until age 60 years. You may not need to do this test if you get a colonoscopy every 10 years.  Flexible sigmoidoscopy or colonoscopy.** / Every 5 years for a flexible sigmoidoscopy or every 10 years  for a colonoscopy beginning at age 44 years and continuing until age 71 years.  Hepatitis C blood test.** / For all people born from [birth year] through 1966 and any individual with known risks for hepatitis C.  Skin self-exam. / Monthly.  Influenza vaccine. / Every year.  Tetanus, diphtheria, and acellular pertussis (Tdap/Td) vaccine.** / Consult your health care provider. Pregnant women should receive 1 dose of Tdap vaccine during each pregnancy. 1 dose of Td every 10 years.  Varicella vaccine.** / Consult your health care provider. Pregnant females who do not have evidence of immunity should receive the first dose after pregnancy.  Zoster vaccine.** / 1 dose for adults aged 30 years or older.  Pneumococcal 13-valent conjugate (PCV13) vaccine.** / Consult your health care provider.  Pneumococcal polysaccharide (PPSV23) vaccine.** / 1 to 2 doses if you smoke cigarettes or if you have certain conditions.  Meningococcal vaccine.** / Consult your health care provider.  Hepatitis A vaccine.** / Consult your health care provider.  Hepatitis B vaccine.** / Consult your health care provider. Screening for abdominal aortic aneurysm (AAA)  by ultrasound is recommended for people over 50 who have history of high blood pressure or who are current or former smokers.

## 2015-08-06 LAB — URINALYSIS, ROUTINE W REFLEX MICROSCOPIC
Bilirubin Urine: NEGATIVE
Glucose, UA: NEGATIVE
Hgb urine dipstick: NEGATIVE
Ketones, ur: NEGATIVE
LEUKOCYTES UA: NEGATIVE
NITRITE: NEGATIVE
PH: 5.5 (ref 5.0–8.0)
Protein, ur: NEGATIVE
SPECIFIC GRAVITY, URINE: 1.021 (ref 1.001–1.035)

## 2015-08-06 LAB — MICROALBUMIN / CREATININE URINE RATIO
Creatinine, Urine: 144 mg/dL (ref 20–320)
Microalb Creat Ratio: 3 mcg/mg creat (ref <30)
Microalb, Ur: 0.4 mg/dL

## 2015-08-06 LAB — VITAMIN D 25 HYDROXY (VIT D DEFICIENCY, FRACTURES): VIT D 25 HYDROXY: 52 ng/mL (ref 30–100)

## 2015-08-08 LAB — INSULIN, RANDOM: INSULIN: 4.2 u[IU]/mL (ref 2.0–19.6)

## 2015-08-09 ENCOUNTER — Other Ambulatory Visit: Payer: Self-pay | Admitting: *Deleted

## 2015-08-09 LAB — TB SKIN TEST
INDURATION: 0 mm
TB SKIN TEST: NEGATIVE

## 2015-08-09 MED ORDER — ROSUVASTATIN CALCIUM 40 MG PO TABS: 40.0000 mg | ORAL_TABLET | Freq: Every day | ORAL | Status: DC

## 2015-09-07 ENCOUNTER — Encounter: Payer: Self-pay | Admitting: Internal Medicine

## 2015-09-07 ENCOUNTER — Ambulatory Visit (INDEPENDENT_AMBULATORY_CARE_PROVIDER_SITE_OTHER): Payer: BLUE CROSS/BLUE SHIELD | Admitting: Internal Medicine

## 2015-09-07 VITALS — BP 140/82 | HR 60 | Temp 97.5°F | Resp 16 | Ht 66.25 in | Wt 171.8 lb

## 2015-09-07 DIAGNOSIS — R030 Elevated blood-pressure reading, without diagnosis of hypertension: Secondary | ICD-10-CM

## 2015-09-07 DIAGNOSIS — E785 Hyperlipidemia, unspecified: Secondary | ICD-10-CM | POA: Diagnosis not present

## 2015-09-07 DIAGNOSIS — IMO0001 Reserved for inherently not codable concepts without codable children: Secondary | ICD-10-CM

## 2015-09-07 NOTE — Progress Notes (Signed)
  Subjective:    Patient ID: Madison Whitehead, female    DOB: [birth year], 60 y.o.   MRN: [id number]  HPI  This nice 60 yo MWF returns for 1 month f/u of labile elevated BP with review of list of BP's since last OV showing BP's gradually normalizing as found with today's visit. Patient's lipids were ver elevated & she was agreeable to starting treatment which she endorses that she continues. HT systems review is negative.  Medication Sig  . Cholecalciferol (VITAMIN D PO) Take 5,000 Units by mouth 2 (two) times daily.  . rosuvastatin (CRESTOR) 40 MG tablet Take 1 tablet (40 mg total) by mouth daily.    Allergies  Allergen Reactions  . Codeine     REACTION: TREMORS/NERVOUS  . Penicillins     REACTION: RASH/SWELLING   Review of Systems 10 point systems review negative except as above.    Objective:   Physical Exam  BP 140/82 mmHg  Pulse 60  Temp(Src) 97.5 F (36.4 C)  Resp 16  Ht 5' 6.25" (1.683 m)  Wt 171 lb 12.8 oz (77.928 kg)  BMI 27.51 kg/m2   HEENT - Eac's patent. TM's Nl. EOM's full. PERRLA. NasoOroPharynx clear. Neck - supple. Nl Thyroid. Carotids 2+ & No bruits, nodes, JVD Chest - Clear equal BS w/o Rales, rhonchi, wheezes. Cor - Nl HS. RRR w/o sig MGR. PP 1(+). No edema.  MS- FROM w/o deformities. Muscle power, tone and bulk Nl. Gait Nl. Neuro - No obvious Cr N abnormalities. Sensory, motor and Cerebellar functions appear Nl w/o focal abnormalities.    Assessment & Plan:   1. Elevated BP  -  Encouraged continued monitoring  2. Hyperlipidemia  - continue meds and see OV in 2 months for lipid labs  Over of exam, counseling, chart review and decision making was performed

## 2015-11-09 ENCOUNTER — Ambulatory Visit (INDEPENDENT_AMBULATORY_CARE_PROVIDER_SITE_OTHER): Payer: BLUE CROSS/BLUE SHIELD | Admitting: Internal Medicine

## 2015-11-09 ENCOUNTER — Encounter: Payer: Self-pay | Admitting: Internal Medicine

## 2015-11-09 VITALS — BP 112/68 | HR 58 | Temp 98.2°F | Resp 18 | Ht 66.25 in | Wt 166.0 lb

## 2015-11-09 DIAGNOSIS — I10 Essential (primary) hypertension: Secondary | ICD-10-CM | POA: Diagnosis not present

## 2015-11-09 DIAGNOSIS — E785 Hyperlipidemia, unspecified: Secondary | ICD-10-CM | POA: Diagnosis not present

## 2015-11-09 DIAGNOSIS — Z79899 Other long term (current) drug therapy: Secondary | ICD-10-CM | POA: Diagnosis not present

## 2015-11-09 DIAGNOSIS — R7303 Prediabetes: Secondary | ICD-10-CM

## 2015-11-09 DIAGNOSIS — E559 Vitamin D deficiency, unspecified: Secondary | ICD-10-CM | POA: Diagnosis not present

## 2015-11-09 LAB — CBC WITH DIFFERENTIAL/PLATELET
BASOS ABS: 0 {cells}/uL (ref 0–200)
Basophils Relative: 0 %
EOS PCT: 1 %
Eosinophils Absolute: 66 cells/uL (ref 15–500)
HCT: 38.8 % (ref 35.0–45.0)
HEMOGLOBIN: 12.8 g/dL (ref 11.7–15.5)
LYMPHS ABS: 2376 {cells}/uL (ref 850–3900)
Lymphocytes Relative: 36 %
MCH: 31.3 pg (ref 27.0–33.0)
MCHC: 33 g/dL (ref 32.0–36.0)
MCV: 94.9 fL (ref 80.0–100.0)
MPV: 10.7 fL (ref 7.5–12.5)
Monocytes Absolute: 726 cells/uL (ref 200–950)
Monocytes Relative: 11 %
NEUTROS ABS: 3432 {cells}/uL (ref 1500–7800)
Neutrophils Relative %: 52 %
Platelets: 230 10*3/uL (ref 140–400)
RBC: 4.09 MIL/uL (ref 3.80–5.10)
RDW: 13.5 % (ref 11.0–15.0)
WBC: 6.6 10*3/uL (ref 3.8–10.8)

## 2015-11-09 NOTE — Progress Notes (Signed)
Assessment and Plan:  Hypertension:  -Continue medication,  -monitor blood pressure at home.  -Continue DASH diet.   -Reminder to go to the ER if any CP, SOB, nausea, dizziness, severe HA, changes vision/speech, left arm numbness and tingling, and jaw pain.  Cholesterol: -cont crestor -Continue diet and exercise.  -Check cholesterol.   Pre-diabetes: -Continue diet and exercise.  -Check A1C  Vitamin D Def: -check level -continue medications.   Continue diet and meds as discussed. Further disposition pending results of labs.  HPI 60 y.o. female  presents for 3 month follow up with hypertension, hyperlipidemia, prediabetes and vitamin D.  She reports that she has a big bike trip with her husband to the [place].  She reports that she is going to ride for a long time.  She reports that it will take them.     Her blood pressure has been controlled at home, today their BP is BP: 112/68 mmHg.   She does not workout. She denies chest pain, shortness of breath, dizziness.   She is on cholesterol medication and denies myalgias. Her cholesterol is not at goal. The cholesterol last visit was:   Lab Results  Component Value Date   CHOL 278* 08/05/2015   HDL 68 08/05/2015   LDLCALC 193* 08/05/2015   TRIG 86 08/05/2015   CHOLHDL 4.1 08/05/2015     She has been working on diet and exercise for prediabetes, and denies foot ulcerations, hyperglycemia, hypoglycemia , increased appetite, nausea, paresthesia of the feet, polydipsia, polyuria, visual disturbances, vomiting and weight loss. Last A1C in the office was:  Lab Results  Component Value Date   HGBA1C 5.6 08/05/2015    Patient is on Vitamin D supplement.  Lab Results  Component Value Date   VD25OH 52 08/05/2015      Current Medications:  Current Outpatient Prescriptions on File Prior to Visit  Medication Sig Dispense Refill  . Cholecalciferol (VITAMIN D PO) Take 5,000 Units by mouth 2 (two) times daily.    . rosuvastatin  (CRESTOR) 40 MG tablet Take 1 tablet (40 mg total) by mouth daily. 30 tablet 2   No current facility-administered medications on file prior to visit.    Medical History: No past medical history on file.  Allergies:  Allergies  Allergen Reactions  . Codeine     REACTION: TREMORS/NERVOUS  . Penicillins     REACTION: RASH/SWELLING     Review of Systems:  Review of Systems  Constitutional: Negative for fever, chills and malaise/fatigue.  HENT: Negative for congestion, ear pain and sore throat.   Respiratory: Negative for cough, shortness of breath and wheezing.   Cardiovascular: Negative for chest pain, palpitations and leg swelling.  Gastrointestinal: Negative for heartburn, abdominal pain, diarrhea, constipation, blood in stool and melena.  Genitourinary: Negative.   Skin: Negative.   Neurological: Negative for dizziness, loss of consciousness and headaches.  Psychiatric/Behavioral: Negative for depression. The patient is not nervous/anxious and does not have insomnia.     Family history- Review and unchanged  Social history- Review and unchanged  Physical Exam: BP 112/68 mmHg  Pulse 58  Temp(Src) 98.2 F (36.8 C) (Temporal)  Resp 18  Ht 5' 6.25" (1.683 m)  Wt 166 lb (75.297 kg)  BMI 26.58 kg/m2 Wt Readings from Last 3 Encounters:  11/09/15 166 lb (75.297 kg)  09/07/15 171 lb 12.8 oz (77.928 kg)  08/05/15 178 lb 4.8 oz (80.876 kg)    General Appearance: Well nourished well developed, in no apparent distress. Eyes:  PERRLA, EOMs, conjunctiva no swelling or erythema ENT/Mouth: Ear canals normal without obstruction, swelling, erythma, discharge.  TMs normal bilaterally.  Oropharynx moist, clear, without exudate, or postoropharyngeal swelling. Neck: Supple, thyroid normal,no cervical adenopathy  Respiratory: Respiratory effort normal, Breath sounds clear A&P without rhonchi, wheeze, or rale.  No retractions, no accessory usage. Cardio: RRR with no MRGs. Brisk peripheral  pulses without edema.  Abdomen: Soft, + BS,  Non tender, no guarding, rebound, hernias, masses. Musculoskeletal: Full ROM, 5/5 strength, Normal gait Skin: Warm, dry without rashes, lesions, ecchymosis.  Neuro: Awake and oriented X 3, Cranial nerves intact. Normal muscle tone, no cerebellar symptoms. Psych: Normal affect, Insight and Judgment appropriate.    Terri Piedra, PA-C 4:15 PM [facility]

## 2015-11-10 LAB — BASIC METABOLIC PANEL WITH GFR
BUN: 17 mg/dL (ref 7–25)
CO2: 24 mmol/L (ref 20–31)
Calcium: 9.7 mg/dL (ref 8.6–10.4)
Chloride: 102 mmol/L (ref 98–110)
Creat: 1.05 mg/dL — ABNORMAL HIGH (ref 0.50–0.99)
GFR, EST AFRICAN AMERICAN: 67 mL/min (ref >=60)
GFR, EST NON AFRICAN AMERICAN: 58 mL/min — AB (ref >=60)
Glucose, Bld: 83 mg/dL (ref 65–99)
POTASSIUM: 5.2 mmol/L (ref 3.5–5.3)
Sodium: 139 mmol/L (ref 135–146)

## 2015-11-10 LAB — LIPID PANEL
CHOL/HDL RATIO: 2.5 ratio (ref <=5.0)
Cholesterol: 156 mg/dL (ref 125–200)
HDL: 62 mg/dL (ref >=46)
LDL Cholesterol: 79 mg/dL (ref <130)
Triglycerides: 74 mg/dL (ref <150)
VLDL: 15 mg/dL (ref <30)

## 2015-11-10 LAB — HEPATIC FUNCTION PANEL
ALBUMIN: 4.2 g/dL (ref 3.6–5.1)
ALK PHOS: 81 U/L (ref 33–130)
ALT: 18 U/L (ref 6–29)
AST: 24 U/L (ref 10–35)
BILIRUBIN INDIRECT: 0.4 mg/dL (ref 0.2–1.2)
BILIRUBIN TOTAL: 0.5 mg/dL (ref 0.2–1.2)
Bilirubin, Direct: 0.1 mg/dL (ref <=0.2)
Total Protein: 7.3 g/dL (ref 6.1–8.1)

## 2016-02-27 ENCOUNTER — Encounter: Payer: Self-pay | Admitting: Internal Medicine

## 2016-02-27 ENCOUNTER — Ambulatory Visit (INDEPENDENT_AMBULATORY_CARE_PROVIDER_SITE_OTHER): Payer: BLUE CROSS/BLUE SHIELD | Admitting: Internal Medicine

## 2016-02-27 VITALS — BP 118/66 | HR 100 | Temp 97.7°F | Resp 16 | Wt 167.6 lb

## 2016-02-27 DIAGNOSIS — R7303 Prediabetes: Secondary | ICD-10-CM | POA: Diagnosis not present

## 2016-02-27 DIAGNOSIS — E559 Vitamin D deficiency, unspecified: Secondary | ICD-10-CM

## 2016-02-27 DIAGNOSIS — J069 Acute upper respiratory infection, unspecified: Secondary | ICD-10-CM

## 2016-02-27 DIAGNOSIS — I10 Essential (primary) hypertension: Secondary | ICD-10-CM | POA: Diagnosis not present

## 2016-02-27 DIAGNOSIS — E785 Hyperlipidemia, unspecified: Secondary | ICD-10-CM | POA: Diagnosis not present

## 2016-02-27 DIAGNOSIS — Z79899 Other long term (current) drug therapy: Secondary | ICD-10-CM

## 2016-02-27 MED ORDER — PREDNISONE 20 MG PO TABS: ORAL_TABLET | ORAL | 0 refills | Status: DC

## 2016-02-27 MED ORDER — FLUTICASONE PROPIONATE 50 MCG/ACT NA SUSP: 2.0000 | Freq: Every day | NASAL | 0 refills | Status: DC

## 2016-02-27 NOTE — Progress Notes (Signed)
Assessment and Plan:  Hypertension:  -watch BP with cough tablets -monitor blood pressure at home.  -Continue DASH diet.   -Reminder to go to the ER if any CP, SOB, nausea, dizziness, severe HA, changes vision/speech, left arm numbness and tingling, and jaw pain.  Cholesterol: -Continue diet and exercise.    Pre-diabetes: -Continue diet and exercise.   Vitamin D Def: -continue medications.   Acute URI vs. Allergic rhinitis -prednisone -flonase -benadryl at night -claritin during the day  Continue diet and meds as discussed. Further disposition pending results of labs.  HPI 60 y.o. female  presents for 3 month follow up with hypertension, hyperlipidemia, prediabetes and vitamin D.   Her blood pressure has been controlled at home, today their BP is BP: 118/66.   She does workout. She denies chest pain, shortness of breath, dizziness.  She is doing 3 exercise classes per week and is also doing    She is on cholesterol medication and denies myalgias. Her cholesterol is at goal. The cholesterol last visit was:   Lab Results  Component Value Date   CHOL 156 11/09/2015   HDL 62 11/09/2015   LDLCALC 79 11/09/2015   TRIG 74 11/09/2015   CHOLHDL 2.5 11/09/2015     She has been working on diet and exercise for prediabetes, and denies foot ulcerations, hyperglycemia, hypoglycemia , increased appetite, nausea, paresthesia of the feet, polydipsia, polyuria, visual disturbances and vomiting. Last A1C in the office was:  Lab Results  Component Value Date   HGBA1C 5.6 08/05/2015    Patient is on Vitamin D supplement.  Lab Results  Component Value Date   VD25OH 52 08/05/2015     Patient reports that recently for the last couple weeks she has been having congestion, cough and post nasal drainage.  She reports that she was recently on a cross country motorcycle ride.  She reports that she did have some difficulty with breathing.  She reports that she did take a zpak.  She felt like she  did have some relief, but not all relief.  She reports that she has developed cough again.  She has been taking cold and flu medication.   She has not been using any allergy medication.     Current Medications:  Current Outpatient Prescriptions on File Prior to Visit  Medication Sig Dispense Refill  . Cholecalciferol (VITAMIN D PO) Take 5,000 Units by mouth 2 (two) times daily.    . rosuvastatin (CRESTOR) 40 MG tablet Take 1 tablet (40 mg total) by mouth daily. 30 tablet 2   No current facility-administered medications on file prior to visit.     Medical History: No past medical history on file.  Allergies:  Allergies  Allergen Reactions  . Codeine     REACTION: TREMORS/NERVOUS  . Penicillins     REACTION: RASH/SWELLING     Review of Systems:  Review of Systems  Constitutional: Negative for chills, fever and malaise/fatigue.  HENT: Negative for congestion, ear pain and sore throat.   Eyes: Negative.   Respiratory: Negative for cough, shortness of breath and wheezing.   Cardiovascular: Negative for chest pain, palpitations and leg swelling.  Gastrointestinal: Negative for abdominal pain, blood in stool, constipation, diarrhea, heartburn and melena.  Genitourinary: Negative.   Skin: Negative.   Neurological: Negative for dizziness, sensory change, loss of consciousness and headaches.  Psychiatric/Behavioral: Negative for depression. The patient is not nervous/anxious and does not have insomnia.     Family history- Review and unchanged  Social history- Review and unchanged  Physical Exam: BP 118/66   Pulse 100   Temp 97.7 F (36.5 C)   Resp 16   Wt 167 lb 9.6 oz (76 kg)   SpO2 96%   BMI 26.85 kg/m  Wt Readings from Last 3 Encounters:  02/27/16 167 lb 9.6 oz (76 kg)  11/09/15 166 lb (75.3 kg)  09/07/15 171 lb 12.8 oz (77.9 kg)    General Appearance: Well nourished well developed, in no apparent distress. Eyes: PERRLA, EOMs, conjunctiva no swelling or  erythema ENT/Mouth: Ear canals normal without obstruction, swelling, erythma, discharge.  TMs normal bilaterally.  Oropharynx moist, clear, without exudate, or postoropharyngeal swelling. Neck: Supple, thyroid normal,no cervical adenopathy  Respiratory: Respiratory effort normal, Breath sounds clear A&P without rhonchi, wheeze, or rale.  No retractions, no accessory usage. Cardio: RRR with no MRGs. Brisk peripheral pulses without edema.  Abdomen: Soft, + BS,  Non tender, no guarding, rebound, hernias, masses. Musculoskeletal: Full ROM, 5/5 strength, Normal gait Skin: Warm, dry without rashes, lesions, ecchymosis.  Neuro: Awake and oriented X 3, Cranial nerves intact. Normal muscle tone, no cerebellar symptoms. Psych: Normal affect, Insight and Judgment appropriate.    Terri Piedraourtney Forcucci, PA-C 3:23 PM [facility]

## 2016-03-15 ENCOUNTER — Other Ambulatory Visit: Payer: Self-pay | Admitting: *Deleted

## 2016-03-15 MED ORDER — FLUTICASONE PROPIONATE 50 MCG/ACT NA SUSP: 2.0000 | Freq: Every day | NASAL | 2 refills | Status: DC

## 2016-03-22 ENCOUNTER — Ambulatory Visit (INDEPENDENT_AMBULATORY_CARE_PROVIDER_SITE_OTHER): Payer: BLUE CROSS/BLUE SHIELD | Admitting: Internal Medicine

## 2016-03-22 ENCOUNTER — Emergency Department (HOSPITAL_COMMUNITY): Payer: BLUE CROSS/BLUE SHIELD

## 2016-03-22 ENCOUNTER — Encounter (HOSPITAL_COMMUNITY): Payer: Self-pay | Admitting: Emergency Medicine

## 2016-03-22 ENCOUNTER — Inpatient Hospital Stay (HOSPITAL_COMMUNITY)
Admission: EM | Admit: 2016-03-22 | Discharge: 2016-03-24 | DRG: 286 | Disposition: A | Discharge status: Home / Self Care | Payer: BLUE CROSS/BLUE SHIELD | Attending: Cardiovascular Disease | Admitting: Cardiovascular Disease

## 2016-03-22 VITALS — Resp 18 | Ht 66.25 in | Wt 175.0 lb

## 2016-03-22 DIAGNOSIS — I110 Hypertensive heart disease with heart failure: Secondary | ICD-10-CM | POA: Diagnosis present

## 2016-03-22 DIAGNOSIS — R000 Tachycardia, unspecified: Secondary | ICD-10-CM

## 2016-03-22 DIAGNOSIS — I5021 Acute systolic (congestive) heart failure: Secondary | ICD-10-CM | POA: Diagnosis not present

## 2016-03-22 DIAGNOSIS — I4891 Unspecified atrial fibrillation: Principal | ICD-10-CM | POA: Diagnosis present

## 2016-03-22 DIAGNOSIS — I10 Essential (primary) hypertension: Secondary | ICD-10-CM | POA: Diagnosis not present

## 2016-03-22 DIAGNOSIS — I428 Other cardiomyopathies: Secondary | ICD-10-CM | POA: Diagnosis present

## 2016-03-22 DIAGNOSIS — I481 Persistent atrial fibrillation: Secondary | ICD-10-CM | POA: Diagnosis not present

## 2016-03-22 DIAGNOSIS — Z7951 Long term (current) use of inhaled steroids: Secondary | ICD-10-CM

## 2016-03-22 DIAGNOSIS — E785 Hyperlipidemia, unspecified: Secondary | ICD-10-CM | POA: Diagnosis present

## 2016-03-22 DIAGNOSIS — Z7982 Long term (current) use of aspirin: Secondary | ICD-10-CM

## 2016-03-22 DIAGNOSIS — Z7952 Long term (current) use of systemic steroids: Secondary | ICD-10-CM

## 2016-03-22 DIAGNOSIS — E784 Other hyperlipidemia: Secondary | ICD-10-CM | POA: Diagnosis not present

## 2016-03-22 HISTORY — DX: Unspecified atrial fibrillation: I48.91

## 2016-03-22 HISTORY — DX: Hyperlipidemia, unspecified: E78.5

## 2016-03-22 HISTORY — DX: Essential (primary) hypertension: I10

## 2016-03-22 HISTORY — DX: Prediabetes: R73.03

## 2016-03-22 HISTORY — DX: Vitamin D deficiency, unspecified: E55.9

## 2016-03-22 LAB — COMPREHENSIVE METABOLIC PANEL
ALBUMIN: 3.8 g/dL (ref 3.5–5.0)
ALK PHOS: 90 U/L (ref 38–126)
ALT: 81 U/L — AB (ref 14–54)
AST: 76 U/L — ABNORMAL HIGH (ref 15–41)
Anion gap: 13 (ref 5–15)
BUN: 23 mg/dL — ABNORMAL HIGH (ref 6–20)
CALCIUM: 9.7 mg/dL (ref 8.9–10.3)
CO2: 18 mmol/L — AB (ref 22–32)
CREATININE: 1.24 mg/dL — AB (ref 0.44–1.00)
Chloride: 105 mmol/L (ref 101–111)
GFR calc Af Amer: 54 mL/min — ABNORMAL LOW (ref >60)
GFR calc non Af Amer: 46 mL/min — ABNORMAL LOW (ref >60)
GLUCOSE: 128 mg/dL — AB (ref 65–99)
Potassium: 4.1 mmol/L (ref 3.5–5.1)
SODIUM: 136 mmol/L (ref 135–145)
Total Bilirubin: 0.9 mg/dL (ref 0.3–1.2)
Total Protein: 6.8 g/dL (ref 6.5–8.1)

## 2016-03-22 LAB — CBC WITH DIFFERENTIAL/PLATELET
BASOS PCT: 0 %
Basophils Absolute: 0 10*3/uL (ref 0.0–0.1)
EOS ABS: 0 10*3/uL (ref 0.0–0.7)
Eosinophils Relative: 0 %
HCT: 46.5 % — ABNORMAL HIGH (ref 36.0–46.0)
Hemoglobin: 15.3 g/dL — ABNORMAL HIGH (ref 12.0–15.0)
Lymphocytes Relative: 13 %
Lymphs Abs: 1.7 10*3/uL (ref 0.7–4.0)
MCH: 31.3 pg (ref 26.0–34.0)
MCHC: 32.9 g/dL (ref 30.0–36.0)
MCV: 95.1 fL (ref 78.0–100.0)
MONO ABS: 0.5 10*3/uL (ref 0.1–1.0)
MONOS PCT: 4 %
NEUTROS ABS: 11.3 10*3/uL — AB (ref 1.7–7.7)
Neutrophils Relative %: 83 %
PLATELETS: 246 10*3/uL (ref 150–400)
RBC: 4.89 MIL/uL (ref 3.87–5.11)
RDW: 12.9 % (ref 11.5–15.5)
WBC: 13.6 10*3/uL — ABNORMAL HIGH (ref 4.0–10.5)

## 2016-03-22 LAB — URINALYSIS, ROUTINE W REFLEX MICROSCOPIC
Bilirubin Urine: NEGATIVE
GLUCOSE, UA: NEGATIVE mg/dL
HGB URINE DIPSTICK: NEGATIVE
KETONES UR: NEGATIVE mg/dL
LEUKOCYTES UA: NEGATIVE
Nitrite: NEGATIVE
Protein, ur: NEGATIVE mg/dL
Specific Gravity, Urine: 1.008 (ref 1.005–1.030)
pH: 6 (ref 5.0–8.0)

## 2016-03-22 LAB — I-STAT TROPONIN, ED: Troponin i, poc: 0.01 ng/mL (ref 0.00–0.08)

## 2016-03-22 LAB — TSH: TSH: 2.09 u[IU]/mL (ref 0.350–4.500)

## 2016-03-22 LAB — BRAIN NATRIURETIC PEPTIDE: B NATRIURETIC PEPTIDE 5: 847.9 pg/mL — AB (ref 0.0–100.0)

## 2016-03-22 MED ORDER — ONDANSETRON HCL 4 MG/2ML IJ SOLN: 4.0000 mg | Freq: Four times a day (QID) | INTRAMUSCULAR | Status: DC | PRN

## 2016-03-22 MED ORDER — SODIUM CHLORIDE 0.9 % IV SOLN: 250.0000 mL | INTRAVENOUS | Status: DC | PRN

## 2016-03-22 MED ORDER — DILTIAZEM HCL-DEXTROSE 100-5 MG/100ML-% IV SOLN (PREMIX)
5.0000 mg/h | INTRAVENOUS | Status: DC
  Administered 2016-03-22: 5 mg/h via INTRAVENOUS
  Administered 2016-03-22 – 2016-03-23 (×4): 15 mg/h via INTRAVENOUS
  Filled 2016-03-22 (×6): qty 100

## 2016-03-22 MED ORDER — SODIUM CHLORIDE 0.9% FLUSH
3.0000 mL | Freq: Two times a day (BID) | INTRAVENOUS | Status: DC
  Administered 2016-03-22 – 2016-03-23 (×3): 3 mL via INTRAVENOUS

## 2016-03-22 MED ORDER — ACETAMINOPHEN 325 MG PO TABS: 650.0000 mg | ORAL_TABLET | Freq: Four times a day (QID) | ORAL | Status: DC | PRN

## 2016-03-22 MED ORDER — ACETAMINOPHEN 650 MG RE SUPP
650.0000 mg | Freq: Four times a day (QID) | RECTAL | Status: DC | PRN
Start: 2016-03-22 — End: 2016-03-24

## 2016-03-22 MED ORDER — DILTIAZEM LOAD VIA INFUSION
10.0000 mg | Freq: Once | INTRAVENOUS | Status: AC
  Administered 2016-03-22: 10 mg via INTRAVENOUS
  Filled 2016-03-22: qty 10

## 2016-03-22 MED ORDER — SODIUM CHLORIDE 0.9% FLUSH
3.0000 mL | Freq: Two times a day (BID) | INTRAVENOUS | Status: DC
  Administered 2016-03-22 (×2): 3 mL via INTRAVENOUS

## 2016-03-22 MED ORDER — DOCUSATE SODIUM 100 MG PO CAPS
100.0000 mg | ORAL_CAPSULE | Freq: Two times a day (BID) | ORAL | Status: DC
  Administered 2016-03-22 – 2016-03-23 (×2): 100 mg via ORAL
  Filled 2016-03-22 (×2): qty 1

## 2016-03-22 MED ORDER — METOPROLOL TARTRATE 5 MG/5ML IV SOLN
5.0000 mg | Freq: Once | INTRAVENOUS | Status: AC
  Administered 2016-03-22: 5 mg via INTRAVENOUS
  Filled 2016-03-22: qty 5

## 2016-03-22 MED ORDER — FUROSEMIDE 10 MG/ML IJ SOLN
40.0000 mg | Freq: Once | INTRAMUSCULAR | Status: AC
  Administered 2016-03-22: 40 mg via INTRAVENOUS
  Filled 2016-03-22: qty 4

## 2016-03-22 MED ORDER — ONDANSETRON HCL 4 MG PO TABS: 4.0000 mg | ORAL_TABLET | Freq: Four times a day (QID) | ORAL | Status: DC | PRN

## 2016-03-22 MED ORDER — SODIUM CHLORIDE 0.9% FLUSH: 3.0000 mL | INTRAVENOUS | Status: DC | PRN

## 2016-03-22 NOTE — ED Notes (Signed)
MD at bedside. 

## 2016-03-22 NOTE — ED Triage Notes (Signed)
Per EMS pt has been feeling SOB for 2 weeks and last night pt noticed her ankles were swollen and still SOB, saw PCP and was found to be in SVT. Pt was given 6, 12, 12 mg of Adenosine by EMS. Rate slowed after second and third dose but went back to SVT. Vagal maneuvers attempted and unsuccessful.

## 2016-03-22 NOTE — ED Notes (Signed)
Pt oxygen saturation 87%, Dr aware and pt on 3 L [place], oxygen saturation 91%

## 2016-03-22 NOTE — Progress Notes (Signed)
Subjective:    Patient ID: Madison SchillerDebra Pinn, female    DOB: 1955-12-25, 60 y.o.   MRN: [id number]  HPI  Patient presented to the office for evaluation of shortness of breath and palpitations.  Patient reports that for the past 3-4 days she has felt short of breath with exertion of walking up the stairs.  Last night she noticed that her ankles were swelling a lot.  This morning when she got to our office for palpitations she developed worsening palpitations.  She was recently seen on 02/27/16 for allergic rhinitis vs. Acute uri and a 3 month follow-up.  She was given prednisone and also flonase and was taking claritin.  She reports that on her way here she did drink 2 cups of coffee.  She has no history of SVT or atrial fibrillation.  She reports that her mother and father both had cardiac histories.  She is current on crestor for hyperlipidemia.  She is a never smoker.  She currently denies chest pain, dizziness, lightheadedness, numbness.  She denies abdominal pain.    Review of Systems  Constitutional: Positive for fatigue. Negative for chills and fever.  HENT: Negative for congestion, ear pain and sore throat.   Eyes: Negative.   Respiratory: Positive for shortness of breath. Negative for cough, chest tightness and wheezing.   Cardiovascular: Positive for palpitations and leg swelling. Negative for chest pain.  Gastrointestinal: Negative for abdominal pain, blood in stool, constipation and diarrhea.  Genitourinary: Negative.   Skin: Negative.   Neurological: Negative for dizziness, light-headedness and headaches.  Psychiatric/Behavioral: The patient is nervous/anxious.        Objective:   Physical Exam  Constitutional: She is oriented to person, place, and time. She appears well-developed and well-nourished. No distress.  HENT:  Head: Normocephalic.  Mouth/Throat: Oropharynx is clear and moist. No oropharyngeal exudate.  Eyes: Conjunctivae and EOM are normal. Pupils are equal, round,  and reactive to light. No scleral icterus.  Neck: Normal range of motion. Neck supple. No JVD present. No thyromegaly present.  Cardiovascular: Normal heart sounds and intact distal pulses.  Tachycardia present.  Exam reveals no gallop and no friction rub.   No murmur heard. Given rapid rate difficult to tell whether rhythm is regular.     Pulmonary/Chest: Effort normal and breath sounds normal. No respiratory distress. She has no wheezes. She has no rales. She exhibits no tenderness.  Abdominal: Soft. Bowel sounds are normal. She exhibits no distension and no mass. There is no tenderness. There is no rebound and no guarding.  Musculoskeletal: Normal range of motion. She exhibits no edema, tenderness or deformity.  Lymphadenopathy:    She has no cervical adenopathy.  Neurological: She is alert and oriented to person, place, and time. No cranial nerve deficit. Coordination normal.  Skin: Skin is warm and dry. She is not diaphoretic.  Psychiatric: Her speech is normal and behavior is normal. Judgment and thought content normal. Her mood appears anxious. Cognition and memory are normal.  Nursing note and vitals reviewed.   Vitals:   03/22/16 0922  Resp: 18          Assessment & Plan:     1. Tachycardia -patient presents to the office with severe tachycardia 180's-200.  EKG is atrial fibrillation vs. SVT.  No widening of QRS complex here in the office.  Patient tried vagal maneuvers here without relief.  EMS was called to the office.  Patient does not have heart history.  She has recently been  on prednisone for allergic rhinitis vs. Viral uri.  Patient to be sent to [facility] for further evaluation.  She was given adenosine by EMS prior to leaving the office without relief of tachycardia.  Will need cardiac enzymes, CBC, BMET, CXR, and rate control while at the hospital.

## 2016-03-22 NOTE — ED Provider Notes (Signed)
[facility]-EMERGENCY DEPT Provider Note   CSN: [id number] Arrival date & time: 03/22/16  1026     History   Chief Complaint Chief Complaint  Patient presents with  . Tachycardia    HPI Madison Whitehead is a 60 y.o. female presenting directly from her primary care provider with tachycardia. Patient has a past medical history significant for hyperlipidemia (on Crestor), and hypertension (controlled with diet and exercise). In her primary care provider's office she was noted to have severe tachycardia in the 180s-200s. EMS was called and she was transported to our ED. Along the way vagal maneuvers were attempted without any benefits. Adenosine was provided which improved her tachycardia briefly only to revert back to a rate in the 180s. She states that over the past week she has noticed some changes in her energy level and some vague shortness of breath. This persisted and eventually prompted her to get evaluated by her primary care provider. She endorses some caffeine use which is typical for her normal routine. She endorses some bilateral leg swelling without pain. No feelings of palpitations today or over the past week. She denies any headache, chest pain, abdominal pain, blurred vision, weakness numbness, or paresthesias.   Patient is a nonsmoker. Return to emergency department patient has a recent history of hypertension in her chart but is not currently on any antihypertensives and appeared to be relatively well managed with diet/exercise.  HPI  History reviewed. No pertinent past medical history.  Patient Active Problem List   Diagnosis Date Noted  . Atrial fibrillation with RVR (HCC) 03/22/2016  . HTN (hypertension) 08/05/2015  . Hyperlipidemia 08/05/2015  . Prediabetes 08/05/2015  . Vitamin D deficiency 08/05/2015  . Medication management 08/05/2015    History reviewed. No pertinent surgical history.  OB History    No data available       Home Medications    Prior to  Admission medications   Medication Sig Start Date End Date Taking? Authorizing Provider  aspirin 81 MG chewable tablet Chew 81 mg by mouth daily.   Yes Historical Provider, MD  Cholecalciferol (VITAMIN D PO) Take 5,000 Units by mouth 2 (two) times daily.   Yes Historical Provider, MD  fluticasone (FLONASE) 50 MCG/ACT nasal spray Place 2 sprays into both nostrils daily. 03/15/16  Yes Lucky Cowboy, MD  loratadine (CLARITIN) 10 MG tablet Take 10 mg by mouth daily.   Yes Historical Provider, MD  predniSONE (DELTASONE) 20 MG tablet 3 tabs po day one, then 2 tabs daily x 4 days Patient taking differently: Take 20 mg by mouth 2 (two) times daily. 3 tabs po day one, then 2 tabs daily x 4 days 02/27/16  Yes Courtney Forcucci, PA-C  rosuvastatin (CRESTOR) 40 MG tablet Take 1 tablet (40 mg total) by mouth daily. Patient taking differently: Take 20 mg by mouth 2 (two) times a week.  08/09/15  Yes Lucky Cowboy, MD    Family History No family history on file.  Social History Social History  Substance Use Topics  . Smoking status: Never Smoker  . Smokeless tobacco: Never Used  . Alcohol use 0.0 oz/week     Comment: social     Allergies   Codeine and Penicillins   Review of Systems Review of Systems  Constitutional: Positive for fatigue. Negative for activity change, chills, diaphoresis and fever.  HENT: Negative for congestion, ear pain, facial swelling, hearing loss, nosebleeds, rhinorrhea, tinnitus and trouble swallowing.   Eyes: Negative for photophobia and visual disturbance.  Respiratory: Positive  for cough and shortness of breath. Negative for chest tightness and wheezing.   Cardiovascular: Positive for leg swelling. Negative for chest pain and palpitations.  Gastrointestinal: Negative for abdominal pain, constipation, diarrhea, nausea and vomiting.  Endocrine: Negative for cold intolerance, heat intolerance, polydipsia and polyuria.  Genitourinary: Negative for dysuria, flank pain,  pelvic pain and urgency.  Musculoskeletal: Negative for back pain.  Skin: Negative for pallor and rash.  Neurological: Negative for dizziness, syncope, facial asymmetry, speech difficulty, weakness, light-headedness, numbness and headaches.  Psychiatric/Behavioral: Negative for agitation, behavioral problems and confusion.     Physical Exam Updated Vital Signs BP 132/75   Pulse 66   Temp 99.1 F (37.3 C) (Oral)   Resp 15   Ht 5\' 6"  (1.676 m)   Wt 79.4 kg   SpO2 93%   BMI 28.25 kg/m   Physical Exam  Constitutional: She is oriented to person, place, and time. She appears well-developed and well-nourished. No distress.  HENT:  Head: Normocephalic and atraumatic.  Nose: Nose normal.  Mouth/Throat: Oropharynx is clear and moist.  Eyes: Conjunctivae and EOM are normal. Pupils are equal, round, and reactive to light. No scleral icterus.  Neck: Normal range of motion. No JVD present. No thyromegaly present.  Cardiovascular: Normal heart sounds and intact distal pulses.  An irregularly irregular rhythm present. Tachycardia present.   No murmur heard. Pulmonary/Chest: Effort normal. No respiratory distress. She has no wheezes. She has rales in the right lower field and the left lower field.  Abdominal: Soft. Bowel sounds are normal. There is no tenderness. There is no guarding.  Musculoskeletal: She exhibits edema.  +1 bilateral edema  Lymphadenopathy:    She has no cervical adenopathy.  Neurological: She is alert and oriented to person, place, and time. No cranial nerve deficit. She exhibits normal muscle tone.  Skin: Skin is warm and dry. Capillary refill takes less than 2 seconds. She is not diaphoretic.  Psychiatric: She has a normal mood and affect. Her behavior is normal.     ED Treatments / Results  Labs (all labs ordered are listed, but only abnormal results are displayed) Labs Reviewed  COMPREHENSIVE METABOLIC PANEL - Abnormal; Notable for the following:       Result  Value   CO2 18 (*)    Glucose, Bld 128 (*)    BUN 23 (*)    Creatinine, Ser 1.24 (*)    AST 76 (*)    ALT 81 (*)    GFR calc non Af Amer 46 (*)    GFR calc Af Amer 54 (*)    All other components within normal limits  CBC WITH DIFFERENTIAL/PLATELET - Abnormal; Notable for the following:    WBC 13.6 (*)    Hemoglobin 15.3 (*)    HCT 46.5 (*)    Neutro Abs 11.3 (*)    All other components within normal limits  BRAIN NATRIURETIC PEPTIDE - Abnormal; Notable for the following:    B Natriuretic Peptide 847.9 (*)    All other components within normal limits  TSH  URINALYSIS, ROUTINE W REFLEX MICROSCOPIC (NOT AT [facility])  Rosezena Sensor, ED    EKG  EKG Interpretation  Date/Time:  Thursday March 22 2016 10:32:15 EDT Ventricular Rate:  192 PR Interval:    QRS Duration: 82 QT Interval:  272 QTC Calculation: 487 R Axis:   35 Text Interpretation:  Atrial fibrillation with rapid V-rate Repolarization abnormality, prob rate related No priors for comparison Confirmed by DELO  MD, Riley Lam (  [id number]) on 03/22/2016 10:45:24 AM       Radiology Dg Chest 2 View  Result Date: 03/22/2016 CLINICAL DATA:  Short of breath for 2 weeks, rapid heart rate EXAM: CHEST  2 VIEW COMPARISON:  None. FINDINGS: No pneumonia or effusion is seen. The heart is borderline enlarged. There may be a tiny amount of fluid in the fissures on the lateral view and very mild pulmonary vascular congestion is a definite consideration. Slightly prominent interstitial markings are present at the lung bases as well which could indicate very mild interstitial edema. No bony abnormality is seen. IMPRESSION: Possible mild interstitial edema.  No pneumonia or effusion. Electronically Signed   By: Dwyane DeePaul  Barry M.D.   On: 03/22/2016 12:16    Procedures Procedures (including critical care time)  Medications Ordered in ED Medications  diltiazem (CARDIZEM) 1 mg/mL load via infusion 10 mg (10 mg Intravenous Bolus from Bag 03/22/16 1057)      And  diltiazem (CARDIZEM) 100 mg in dextrose 5% 100mL (1 mg/mL) infusion (15 mg/hr Intravenous Rate/Dose Change 03/22/16 1219)  sodium chloride flush (NS) 0.9 % injection 3 mL (3 mLs Intravenous Given 03/22/16 1413)  sodium chloride flush (NS) 0.9 % injection 3 mL (3 mLs Intravenous Given 03/22/16 1413)  sodium chloride flush (NS) 0.9 % injection 3 mL (not administered)  0.9 %  sodium chloride infusion (not administered)  acetaminophen (TYLENOL) tablet 650 mg (not administered)    Or  acetaminophen (TYLENOL) suppository 650 mg (not administered)  docusate sodium (COLACE) capsule 100 mg (100 mg Oral Refused 03/22/16 1413)  ondansetron (ZOFRAN) tablet 4 mg (not administered)    Or  ondansetron (ZOFRAN) injection 4 mg (not administered)  metoprolol (LOPRESSOR) injection 5 mg (5 mg Intravenous Given 03/22/16 1400)  furosemide (LASIX) injection 40 mg (40 mg Intravenous Given 03/22/16 1402)     Initial Impression / Assessment and Plan / ED Course  I have reviewed the triage vital signs and the nursing notes.  Pertinent labs & imaging results that were available during my care of the patient were reviewed by me and considered in my medical decision making (see chart for details).  Clinical Course   Atrial fibrillation with RVR: Initial EKG showed evidence of A. fib with RVR which was consistent with patient's irregular tachycardia to the 180s. Patient was showing signs of pulmonary vascular congestion with some shortness of breath and oxygen saturations in the low 90s. Patient was started on a diltiazem drip. Patient had mild response initially but A fib persisted. Initial labs yielded an i-STAT troponin negative, TSH 2.09 (N), BNP 847.9, leukocytosis 13.6, hemoglobin 15.3, creatinine 1.24 (1.05 on 11/09/15), AST/ALT elevated to 76/81.   Decision was made to consult cardiology due to new onset A. fib with RVR. Recommendation for 5mg  metoprolol by Dr. Algie CofferKadakia who agreed to see her in ED. Patient  seemed to respond well to the metoprolol and cardiology decided that patient warranted admission for further workup.   Final Clinical Impressions(s) / ED Diagnoses   Final diagnoses:  Atrial fibrillation with rapid ventricular response [facility](HCC)    New Prescriptions New Prescriptions   No medications on file     Kathee DeltonIan D Arihana Ambrocio, MD 03/22/16 1501    Geoffery Lyonsouglas Delo, MD 03/22/16 [phone]

## 2016-03-22 NOTE — H&P (Signed)
Referring Physician:  [name] is an 60 y.o. female.                       Chief Complaint: Shortness of breath and palpitation  HPI: 60 year old female presented with shortness of breath and palpitation x 4 days. EKG showed atrial fibrillation with RVR. Patient denies chest pain but she did had 2 cups of coffee had prednisone, Flonase and Claritin use.  History reviewed. No pertinent past medical history.    History reviewed. No pertinent surgical history.  No family history on file. Social History:  reports that she has never smoked. She has never used smokeless tobacco. She reports that she drinks alcohol. Her drug history is not on file.  Allergies:  Allergies  Allergen Reactions  . Codeine     REACTION: TREMORS/NERVOUS  . Penicillins     REACTION: RASH/SWELLING     (Not in a hospital admission)  Results for orders placed or performed during the hospital encounter of 03/22/16 (from the past 48 hour(s))  Comprehensive metabolic panel     Status: Abnormal   Collection Time: 03/22/16 10:45 AM  Result Value Ref Range   Sodium 136 135 - 145 mmol/L   Potassium 4.1 3.5 - 5.1 mmol/L   Chloride 105 101 - 111 mmol/L   CO2 18 (L) 22 - 32 mmol/L   Glucose, Bld 128 (H) 65 - 99 mg/dL   BUN 23 (H) 6 - 20 mg/dL   Creatinine, Ser 1.24 (H) 0.44 - 1.00 mg/dL   Calcium 9.7 8.9 - 10.3 mg/dL   Total Protein 6.8 6.5 - 8.1 g/dL   Albumin 3.8 3.5 - 5.0 g/dL   AST 76 (H) 15 - 41 U/L   ALT 81 (H) 14 - 54 U/L   Alkaline Phosphatase 90 38 - 126 U/L   Total Bilirubin 0.9 0.3 - 1.2 mg/dL   GFR calc non Af Amer 46 (L) >60 mL/min   GFR calc Af Amer 54 (L) >60 mL/min    Comment: (NOTE) The eGFR has been calculated using the CKD EPI equation. This calculation has not been validated in all clinical situations. eGFR's persistently <60 mL/min signify possible Chronic Kidney Disease.    Anion gap 13 5 - 15  CBC with Differential     Status: Abnormal   Collection Time: 03/22/16 10:45 AM   Result Value Ref Range   WBC 13.6 (H) 4.0 - 10.5 K/uL   RBC 4.89 3.87 - 5.11 MIL/uL   Hemoglobin 15.3 (H) 12.0 - 15.0 g/dL   HCT 46.5 (H) 36.0 - 46.0 %   MCV 95.1 78.0 - 100.0 fL   MCH 31.3 26.0 - 34.0 pg   MCHC 32.9 30.0 - 36.0 g/dL   RDW 12.9 11.5 - 15.5 %   Platelets 246 150 - 400 K/uL   Neutrophils Relative % 83 %   Neutro Abs 11.3 (H) 1.7 - 7.7 K/uL   Lymphocytes Relative 13 %   Lymphs Abs 1.7 0.7 - 4.0 K/uL   Monocytes Relative 4 %   Monocytes Absolute 0.5 0.1 - 1.0 K/uL   Eosinophils Relative 0 %   Eosinophils Absolute 0.0 0.0 - 0.7 K/uL   Basophils Relative 0 %   Basophils Absolute 0.0 0.0 - 0.1 K/uL  Brain natriuretic peptide     Status: Abnormal   Collection Time: 03/22/16 10:45 AM  Result Value Ref Range   B Natriuretic Peptide 847.9 (H) 0.0 - 100.0 pg/mL  TSH     Status: None   Collection Time: 03/22/16 10:45 AM  Result Value Ref Range   TSH 2.090 0.350 - 4.500 uIU/mL    Comment: Performed by a 3rd Generation assay with a functional sensitivity of <=0.01 uIU/mL.  I-stat troponin, ED     Status: None   Collection Time: 03/22/16 10:58 AM  Result Value Ref Range   Troponin i, poc 0.01 0.00 - 0.08 ng/mL   Comment 3            Comment: Due to the release kinetics of cTnI, a negative result within the first hours of the onset of symptoms does not rule out myocardial infarction with certainty. If myocardial infarction is still suspected, repeat the test at appropriate intervals.   Urinalysis, Routine w reflex microscopic     Status: None   Collection Time: 03/22/16 12:01 PM  Result Value Ref Range   Color, Urine YELLOW YELLOW   APPearance CLEAR CLEAR   Specific Gravity, Urine 1.008 1.005 - 1.030   pH 6.0 5.0 - 8.0   Glucose, UA NEGATIVE NEGATIVE mg/dL   Hgb urine dipstick NEGATIVE NEGATIVE   Bilirubin Urine NEGATIVE NEGATIVE   Ketones, ur NEGATIVE NEGATIVE mg/dL   Protein, ur NEGATIVE NEGATIVE mg/dL   Nitrite NEGATIVE NEGATIVE   Leukocytes, UA NEGATIVE  NEGATIVE    Comment: MICROSCOPIC NOT DONE ON URINES WITH NEGATIVE PROTEIN, BLOOD, LEUKOCYTES, NITRITE, OR GLUCOSE <1000 mg/dL.   Dg Chest 2 View  Result Date: 03/22/2016 CLINICAL DATA:  Short of breath for 2 weeks, rapid heart rate EXAM: CHEST  2 VIEW COMPARISON:  None. FINDINGS: No pneumonia or effusion is seen. The heart is borderline enlarged. There may be a tiny amount of fluid in the fissures on the lateral view and very mild pulmonary vascular congestion is a definite consideration. Slightly prominent interstitial markings are present at the lung bases as well which could indicate very mild interstitial edema. No bony abnormality is seen. IMPRESSION: Possible mild interstitial edema.  No pneumonia or effusion. Electronically Signed   By: Ivar Drape M.D.   On: 03/22/2016 12:16    Review Of Systems Constitutional: No fever or chills. Recently shortness of breath. Respiratory: Negative for cough, wheezing or hemoptysis. Cardiovascular: Positive for shortness of breath, palpitation and leg swelling. Negative for chest pain. GI: No abdominal pain or GI bleed. GU: Negative. Skin: No rash. Neurology: Negative for headache, dizziness, stroke or seizures. Psychiatry: Positive anxiety.  Blood pressure (!) 165/121, pulse (!) 158, temperature 99.1 F (37.3 C), temperature source Oral, resp. rate 15, height '5\' 6"'  (1.676 m), weight 79.4 kg (175 lb), SpO2 91 %. Body mass index is 28.25 kg/m. General appearance: alert, cooperative, appears stated age and mild distress Head: Normocephalic, atraumatic Eyes: conjunctivae are pink. Corneas clear. Sclera-non-icteric. PERRL, EOM's intact.  Throat: lips, mucosa, and tongue normal; teeth and gums normal Neck: no adenopathy, no carotid bruit, no JVD, supple, symmetrical, trachea midline and thyroid not enlarged. Resp: clear to auscultation bilaterally Cardio: Irregular rate and rhythm, S1, S2 normal, no murmur, click, rub or gallop GI: soft, non-tender;  bowel sounds normal; no masses,  no organomegaly. Extremities: extremities normal, no cyanosis or edema Skin: Skin color, texture, turgor normal. No rashes or lesions Neurologic: Alert and oriented X 3, normal strength and tone. Normal symmetric reflexes. Normal coordination and gait  Assessment/Plan Atrial fibrillation with RVR R/O Acute congestive heart failure Hypertnesion  Place in observation IV diltiazem drip IV lasix. Echocardiogram.  Shazia Mitchener S,  MD  03/22/2016, 1:53 PM

## 2016-03-22 NOTE — ED Notes (Signed)
Cardiology at bedside.

## 2016-03-23 ENCOUNTER — Observation Stay (HOSPITAL_COMMUNITY): Payer: BLUE CROSS/BLUE SHIELD

## 2016-03-23 ENCOUNTER — Encounter (HOSPITAL_COMMUNITY)
Admission: EM | Disposition: A | Discharge status: Home / Self Care | Payer: Self-pay | Source: Home / Self Care | Attending: Cardiovascular Disease

## 2016-03-23 DIAGNOSIS — E785 Hyperlipidemia, unspecified: Secondary | ICD-10-CM | POA: Diagnosis present

## 2016-03-23 DIAGNOSIS — I481 Persistent atrial fibrillation: Secondary | ICD-10-CM | POA: Diagnosis not present

## 2016-03-23 DIAGNOSIS — I4891 Unspecified atrial fibrillation: Secondary | ICD-10-CM | POA: Diagnosis present

## 2016-03-23 DIAGNOSIS — Z7952 Long term (current) use of systemic steroids: Secondary | ICD-10-CM | POA: Diagnosis not present

## 2016-03-23 DIAGNOSIS — Z7982 Long term (current) use of aspirin: Secondary | ICD-10-CM | POA: Diagnosis not present

## 2016-03-23 DIAGNOSIS — I428 Other cardiomyopathies: Secondary | ICD-10-CM | POA: Diagnosis present

## 2016-03-23 DIAGNOSIS — Z7951 Long term (current) use of inhaled steroids: Secondary | ICD-10-CM | POA: Diagnosis not present

## 2016-03-23 DIAGNOSIS — I5021 Acute systolic (congestive) heart failure: Secondary | ICD-10-CM | POA: Diagnosis not present

## 2016-03-23 DIAGNOSIS — I110 Hypertensive heart disease with heart failure: Secondary | ICD-10-CM | POA: Diagnosis present

## 2016-03-23 HISTORY — PX: CARDIAC CATHETERIZATION: SHX172

## 2016-03-23 LAB — CBC
HCT: 47.4 % — ABNORMAL HIGH (ref 36.0–46.0)
HEMOGLOBIN: 15.9 g/dL — AB (ref 12.0–15.0)
MCH: 31.8 pg (ref 26.0–34.0)
MCHC: 33.5 g/dL (ref 30.0–36.0)
MCV: 94.8 fL (ref 78.0–100.0)
Platelets: 221 10*3/uL (ref 150–400)
RBC: 5 MIL/uL (ref 3.87–5.11)
RDW: 13.1 % (ref 11.5–15.5)
WBC: 11.4 10*3/uL — ABNORMAL HIGH (ref 4.0–10.5)

## 2016-03-23 LAB — POCT I-STAT 3, VENOUS BLOOD GAS (G3P V)
ACID-BASE EXCESS: 4 mmol/L — AB (ref 0.0–2.0)
Bicarbonate: 27.6 mmol/L (ref 20.0–28.0)
O2 SAT: 68 %
PCO2 VEN: 37.3 mmHg — AB (ref 44.0–60.0)
TCO2: 29 mmol/L (ref 0–100)
pH, Ven: 7.476 — ABNORMAL HIGH (ref 7.250–7.430)
pO2, Ven: 33 mmHg (ref 32.0–45.0)

## 2016-03-23 LAB — BASIC METABOLIC PANEL
Anion gap: 11 (ref 5–15)
BUN: 16 mg/dL (ref 6–20)
CHLORIDE: 104 mmol/L (ref 101–111)
CO2: 26 mmol/L (ref 22–32)
CREATININE: 1.03 mg/dL — AB (ref 0.44–1.00)
Calcium: 9.4 mg/dL (ref 8.9–10.3)
GFR calc non Af Amer: 58 mL/min — ABNORMAL LOW (ref >60)
Glucose, Bld: 96 mg/dL (ref 65–99)
Potassium: 3.5 mmol/L (ref 3.5–5.1)
Sodium: 141 mmol/L (ref 135–145)

## 2016-03-23 LAB — PROTIME-INR
INR: 0.97
Prothrombin Time: 12.9 seconds (ref 11.4–15.2)

## 2016-03-23 LAB — ECHOCARDIOGRAM COMPLETE
Height: 66 in
WEIGHTICAEL: 2587.2 [oz_av]

## 2016-03-23 LAB — POCT I-STAT 3, ART BLOOD GAS (G3+)
ACID-BASE EXCESS: 2 mmol/L (ref 0.0–2.0)
Bicarbonate: 24.9 mmol/L (ref 20.0–28.0)
O2 SAT: 92 %
PH ART: 7.485 — AB (ref 7.350–7.450)
TCO2: 26 mmol/L (ref 0–100)
pCO2 arterial: 33.1 mmHg (ref 32.0–48.0)
pO2, Arterial: 58 mmHg — ABNORMAL LOW (ref 83.0–108.0)

## 2016-03-23 SURGERY — RIGHT/LEFT HEART CATH AND CORONARY ANGIOGRAPHY
Anesthesia: LOCAL

## 2016-03-23 MED ORDER — METOPROLOL TARTRATE 12.5 MG HALF TABLET
12.5000 mg | ORAL_TABLET | Freq: Two times a day (BID) | ORAL | Status: DC
  Administered 2016-03-23 – 2016-03-24 (×2): 12.5 mg via ORAL
  Filled 2016-03-23 (×2): qty 1

## 2016-03-23 MED ORDER — RIVAROXABAN 20 MG PO TABS
20.0000 mg | ORAL_TABLET | Freq: Every day | ORAL | Status: DC
  Administered 2016-03-24: 20 mg via ORAL
  Filled 2016-03-23: qty 1

## 2016-03-23 MED ORDER — SODIUM CHLORIDE 0.9 % IV SOLN: 250.0000 mL | INTRAVENOUS | Status: DC | PRN

## 2016-03-23 MED ORDER — HEPARIN (PORCINE) IN NACL 2-0.9 UNIT/ML-% IJ SOLN
INTRAMUSCULAR | Status: DC | PRN
  Administered 2016-03-23: 1500 mL

## 2016-03-23 MED ORDER — FENTANYL CITRATE (PF) 100 MCG/2ML IJ SOLN
INTRAMUSCULAR | Status: DC | PRN
  Administered 2016-03-23: 25 ug via INTRAVENOUS

## 2016-03-23 MED ORDER — IOPAMIDOL (ISOVUE-370) INJECTION 76%
INTRAVENOUS | Status: AC
Start: 2016-03-23 — End: 2016-03-23
  Filled 2016-03-23: qty 100

## 2016-03-23 MED ORDER — DIGOXIN 0.25 MG/ML IJ SOLN
0.2500 mg | Freq: Once | INTRAMUSCULAR | Status: AC
  Administered 2016-03-23: 0.25 mg via INTRAVENOUS
  Filled 2016-03-23: qty 2

## 2016-03-23 MED ORDER — MIDAZOLAM HCL 2 MG/2ML IJ SOLN
INTRAMUSCULAR | Status: AC
  Filled 2016-03-23: qty 2

## 2016-03-23 MED ORDER — LIDOCAINE HCL (PF) 1 % IJ SOLN
INTRAMUSCULAR | Status: DC | PRN
Start: 2016-03-23 — End: 2016-03-23
  Administered 2016-03-23: 15 mL

## 2016-03-23 MED ORDER — DILTIAZEM HCL 30 MG PO TABS
30.0000 mg | ORAL_TABLET | Freq: Four times a day (QID) | ORAL | Status: DC
  Administered 2016-03-23 – 2016-03-24 (×3): 30 mg via ORAL
  Filled 2016-03-23 (×3): qty 1

## 2016-03-23 MED ORDER — ASPIRIN 81 MG PO CHEW
81.0000 mg | CHEWABLE_TABLET | ORAL | Status: AC
  Administered 2016-03-23: 81 mg via ORAL

## 2016-03-23 MED ORDER — RAMIPRIL 1.25 MG PO CAPS
1.2500 mg | ORAL_CAPSULE | Freq: Every day | ORAL | Status: DC
  Administered 2016-03-23 – 2016-03-24 (×2): 1.25 mg via ORAL
  Filled 2016-03-23 (×2): qty 1

## 2016-03-23 MED ORDER — SODIUM CHLORIDE 0.9% FLUSH
3.0000 mL | Freq: Two times a day (BID) | INTRAVENOUS | Status: DC
  Administered 2016-03-23: 3 mL via INTRAVENOUS

## 2016-03-23 MED ORDER — MIDAZOLAM HCL 2 MG/2ML IJ SOLN
INTRAMUSCULAR | Status: DC | PRN
  Administered 2016-03-23: 1 mg via INTRAVENOUS

## 2016-03-23 MED ORDER — SODIUM CHLORIDE 0.9% FLUSH: 3.0000 mL | INTRAVENOUS | Status: DC | PRN

## 2016-03-23 MED ORDER — HEPARIN (PORCINE) IN NACL 100-0.45 UNIT/ML-% IJ SOLN
1000.0000 [IU]/h | INTRAMUSCULAR | Status: AC
  Administered 2016-03-24: 1000 [IU]/h via INTRAVENOUS
  Filled 2016-03-23: qty 250

## 2016-03-23 MED ORDER — IOPAMIDOL (ISOVUE-370) INJECTION 76%
INTRAVENOUS | Status: DC | PRN
Start: 2016-03-23 — End: 2016-03-23
  Administered 2016-03-23: 40 mL via INTRA_ARTERIAL

## 2016-03-23 MED ORDER — HEPARIN (PORCINE) IN NACL 2-0.9 UNIT/ML-% IJ SOLN
INTRAMUSCULAR | Status: AC
  Filled 2016-03-23: qty 1500

## 2016-03-23 MED ORDER — OFF THE BEAT BOOK
Freq: Once | Status: AC
  Administered 2016-03-23: 11:00:00
  Filled 2016-03-23: qty 1

## 2016-03-23 MED ORDER — SODIUM CHLORIDE 0.9 % IV SOLN
INTRAVENOUS | Status: DC
  Administered 2016-03-23: 13:00:00 via INTRAVENOUS

## 2016-03-23 MED ORDER — ASPIRIN 81 MG PO CHEW
81.0000 mg | CHEWABLE_TABLET | ORAL | Status: DC
  Filled 2016-03-23: qty 1

## 2016-03-23 MED ORDER — FENTANYL CITRATE (PF) 100 MCG/2ML IJ SOLN
INTRAMUSCULAR | Status: AC
  Filled 2016-03-23: qty 2

## 2016-03-23 MED ORDER — SODIUM CHLORIDE 0.9 % IV SOLN
INTRAVENOUS | Status: AC
  Administered 2016-03-23: 100 mL/h via INTRAVENOUS

## 2016-03-23 MED ORDER — LIDOCAINE HCL (PF) 1 % IJ SOLN
INTRAMUSCULAR | Status: AC
  Filled 2016-03-23: qty 30

## 2016-03-23 MED ORDER — SODIUM CHLORIDE 0.9% FLUSH: 3.0000 mL | Freq: Two times a day (BID) | INTRAVENOUS | Status: DC

## 2016-03-23 SURGICAL SUPPLY — 10 items
CATH INFINITI 5FR MULTPACK ANG (CATHETERS) ×1 IMPLANT
CATH SWAN GANZ 7F STRAIGHT (CATHETERS) ×1 IMPLANT
KIT HEART LEFT (KITS) ×2 IMPLANT
PACK CARDIAC CATHETERIZATION (CUSTOM PROCEDURE TRAY) ×2 IMPLANT
SHEATH PINNACLE 5F 10CM (SHEATH) ×1 IMPLANT
SHEATH PINNACLE 7F 10CM (SHEATH) ×1 IMPLANT
TRANSDUCER W/STOPCOCK (MISCELLANEOUS) ×4 IMPLANT
TUBING ART PRESS 72  MALE/FEM (TUBING) ×1
TUBING ART PRESS 72 MALE/FEM (TUBING) IMPLANT
WIRE EMERALD 3MM-J .035X150CM (WIRE) ×1 IMPLANT

## 2016-03-23 NOTE — Progress Notes (Signed)
Attestation of my initial ED assessment note from 03/22/16:  CHADVASc score: 2

## 2016-03-23 NOTE — Progress Notes (Signed)
Ref: Nadean CorwinMCKEOWN,WILLIAM DAVID, MD   Subjective:  Feeling better. No chest pain. Cardiac cath without significant major vessel disease. Will add oral diltiazem, small dose B-blocker, ramipril due to low blood pressure and one dose lanoxin for rate control. Continue IV heparin tonight and Xarelto or Eliquis from tomorrow. Will hold lasix use due to low LVEDP, RVEDP and RA pressure.  Objective:  Vital Signs in the last 24 hours: Temp:  [97.6 F (36.4 C)-98.5 F (36.9 C)] 98.5 F (36.9 C) (10/06 2121) Pulse Rate:  [34-137] 52 (10/06 2121) Cardiac Rhythm: Atrial fibrillation (10/06 2002) Resp:  [12-23] 20 (10/06 1635) BP: (109-158)/(60-129) 109/66 (10/06 2121) SpO2:  [91 %-98 %] 94 % (10/06 2121) Weight:  [73.3 kg (161 lb 11.2 oz)] 73.3 kg (161 lb 11.2 oz) (10/06 0454)  Physical Exam: BP Readings from Last 1 Encounters:  03/23/16 109/66    Wt Readings from Last 1 Encounters:  03/23/16 73.3 kg (161 lb 11.2 oz)    Weight change:  Body mass index is 26.1 kg/m. HEENT: [place]/AT, Eyes- PERL, EOMI, Conjunctiva-Pink, Sclera-Non-icteric Neck: No JVD, No bruit, Trachea midline. Lungs:  Clear, Bilateral. Cardiac:  Irregular rhythm, normal S1 and S2, no S3.  Abdomen:  Soft, non-tender. Extremities:  No edema present. No cyanosis. No clubbing. No right groin hematoma. CNS: AxOx3, Cranial nerves grossly intact, moves all 4 extremities. Right handed. Skin: Warm and dry.   Intake/Output from previous day: 10/05 0701 - 10/06 0700 In: 543 [P.O.:240; I.V.:303] Out: 900 [Urine:900]    Lab Results: BMET    Component Value Date/Time   NA 141 03/23/2016 0436   NA 136 03/22/2016 1045   NA 139 11/09/2015 1636   K 3.5 03/23/2016 0436   K 4.1 03/22/2016 1045   K 5.2 11/09/2015 1636   CL 104 03/23/2016 0436   CL 105 03/22/2016 1045   CL 102 11/09/2015 1636   CO2 26 03/23/2016 0436   CO2 18 (L) 03/22/2016 1045   CO2 24 11/09/2015 1636   GLUCOSE 96 03/23/2016 0436   GLUCOSE 128 (H) 03/22/2016 1045    GLUCOSE 83 11/09/2015 1636   BUN 16 03/23/2016 0436   BUN 23 (H) 03/22/2016 1045   BUN 17 11/09/2015 1636   CREATININE 1.03 (H) 03/23/2016 0436   CREATININE 1.24 (H) 03/22/2016 1045   CREATININE 1.05 (H) 11/09/2015 1636   CREATININE 0.92 08/05/2015 1149   CALCIUM 9.4 03/23/2016 0436   CALCIUM 9.7 03/22/2016 1045   CALCIUM 9.7 11/09/2015 1636   GFRNONAA 58 (L) 03/23/2016 0436   GFRNONAA 46 (L) 03/22/2016 1045   GFRNONAA 58 (L) 11/09/2015 1636   GFRNONAA 68 08/05/2015 1149   GFRAA >60 03/23/2016 0436   GFRAA 54 (L) 03/22/2016 1045   GFRAA 67 11/09/2015 1636   GFRAA 78 08/05/2015 1149   CBC    Component Value Date/Time   WBC 11.4 (H) 03/23/2016 0436   RBC 5.00 03/23/2016 0436   HGB 15.9 (H) 03/23/2016 0436   HCT 47.4 (H) 03/23/2016 0436   PLT 221 03/23/2016 0436   MCV 94.8 03/23/2016 0436   MCH 31.8 03/23/2016 0436   MCHC 33.5 03/23/2016 0436   RDW 13.1 03/23/2016 0436   LYMPHSABS 1.7 03/22/2016 1045   MONOABS 0.5 03/22/2016 1045   EOSABS 0.0 03/22/2016 1045   BASOSABS 0.0 03/22/2016 1045   HEPATIC Function Panel  Recent Labs  08/05/15 1149 11/09/15 1636 03/22/16 1045  PROT 7.3 7.3 6.8   HEMOGLOBIN A1C No components found for: HGA1C,  MPG CARDIAC  ENZYMES No results found for: CKTOTAL, CKMB, CKMBINDEX, TROPONINI BNP No results for input(s): PROBNP in the last 8760 hours. TSH  Recent Labs  08/05/15 1149 03/22/16 1045  TSH 1.41 2.090   CHOLESTEROL  Recent Labs  08/05/15 1149 11/09/15 1636  CHOL 278* 156    Scheduled Meds: . digoxin  0.25 mg Intravenous Once  . [START ON 03/24/2016] diltiazem  30 mg Oral Q6H  . docusate sodium  100 mg Oral BID  . metoprolol tartrate  12.5 mg Oral BID  . sodium chloride flush  3 mL Intravenous Q12H  . sodium chloride flush  3 mL Intravenous Q12H  . sodium chloride flush  3 mL Intravenous Q12H   Continuous Infusions: . [START ON 03/24/2016] heparin     PRN Meds:.sodium chloride, sodium chloride, acetaminophen  **OR** acetaminophen, ondansetron **OR** ondansetron (ZOFRAN) IV, sodium chloride flush, sodium chloride flush  Assessment/Plan: Acute left heart systolic failure New onset atrial fibrillation with CHA2DS2VASc score 2 Hypertension resolved  Admit to inpatient. Low salt diet. B-blocker, Ace inhibitor IV heparin 8 hour post cath and Xarelto from tomorrow. Patient prefer OP cardioversion. Discharge in AM if stable F/U 1 week.   LOS: 0 days    Orpah Cobb  MD  03/23/2016, 9:48 PM

## 2016-03-23 NOTE — Progress Notes (Signed)
ANTICOAGULATION CONSULT NOTE - Initial Consult  Pharmacy Consult for heparin Indication: chest pain/ACS  Allergies  Allergen Reactions  . Codeine     REACTION: TREMORS/NERVOUS  . Penicillins     REACTION: RASH/SWELLING    Patient Measurements: Height: 5\' 6"  (167.6 cm) Weight: 161 lb 11.2 oz (73.3 kg) IBW/kg (Calculated) : 59.3 Heparin Dosing Weight: 73.3 kg  Vital Signs: BP: 137/94 (10/06 1635) Pulse Rate: 47 (10/06 1635)  Labs:  Recent Labs  03/22/16 1045 03/23/16 0436  HGB 15.3* 15.9*  HCT 46.5* 47.4*  PLT 246 221  LABPROT  --  12.9  INR  --  0.97  CREATININE 1.24* 1.03*    Estimated Creatinine Clearance: 59.5 mL/min (by C-G formula based on SCr of 1.03 mg/dL (H)).   Medical History: Past Medical History:  Diagnosis Date  . Atrial fibrillation (HCC) 03/22/2016   NEW ONSET   . Hyperlipidemia   . Hypertension   . Pre-diabetes   . Vitamin D deficiency     Medications:  Scheduled:  . docusate sodium  100 mg Oral BID  . sodium chloride flush  3 mL Intravenous Q12H  . sodium chloride flush  3 mL Intravenous Q12H  . sodium chloride flush  3 mL Intravenous Q12H   Infusions:  . sodium chloride 100 mL/hr (03/23/16 1631)  . diltiazem (CARDIZEM) infusion 15 mg/hr (03/23/16 1334)    Assessment:  60 yo female s/p cath will be started on heparin 8hr post sheath removal for ACS.  Sheath was removed around 1630 per RN.  Goal of Therapy:  Heparin level 0.3-0.7 units/ml Monitor platelets by anticoagulation protocol: Yes   Plan:  - Start heparin at 1000 units/hr @ 0100. No bolus - 6hr heparin level - daily heparin level and CBC  Javonnie Illescas, Tsz-Yin 03/23/2016,4:59 PM

## 2016-03-23 NOTE — Interval H&P Note (Signed)
History and Physical Interval Note:  03/23/2016 2:35 PM  Madison Whitehead  has presented today for surgery, with the diagnosis of cp  The various methods of treatment have been discussed with the patient and family. After consideration of risks, benefits and other options for treatment, the patient has consented to  Procedure(s): Right/Left Heart Cath and Coronary Angiography (N/A) as a surgical intervention .  The patient's history has been reviewed, patient examined, no change in status, stable for surgery.  I have reviewed the patient's chart and labs.  Questions were answered to the patient's satisfaction.     Mattia Liford S

## 2016-03-23 NOTE — Progress Notes (Signed)
A 68fr Sheath in the right femoral artery and a 55fr sheath in the right femoral vein was pulled at this time, manual pressure was applied for 20 minutes. Site is a level 0 and remains WNL. Right DP palpable pre/post sheath pull and vitals remained WNL throughout the procedure. Instructions given to pt. Pt will be transferred to 3W19.

## 2016-03-23 NOTE — Progress Notes (Signed)
ANTICOAGULATION CONSULT NOTE - Follow Up Consult  Pharmacy Consult for xarelto Indication: atrial fibrillation  Allergies  Allergen Reactions  . Codeine     REACTION: TREMORS/NERVOUS  . Penicillins     REACTION: RASH/SWELLING    Patient Measurements: Height: 5\' 6"  (167.6 cm) Weight: 161 lb 11.2 oz (73.3 kg) IBW/kg (Calculated) : 59.3 Heparin Dosing Weight:   Vital Signs: Temp: 98.5 F (36.9 C) (10/06 2121) Temp Source: Oral (10/06 2121) BP: 109/66 (10/06 2121) Pulse Rate: 52 (10/06 2121)  Labs:  Recent Labs  03/22/16 1045 03/23/16 0436  HGB 15.3* 15.9*  HCT 46.5* 47.4*  PLT 246 221  LABPROT  --  12.9  INR  --  0.97  CREATININE 1.24* 1.03*    Estimated Creatinine Clearance: 59.5 mL/min (by C-G formula based on SCr of 1.03 mg/dL (H)).   Medications:  Scheduled:  . digoxin  0.25 mg Intravenous Once  . [START ON 03/24/2016] diltiazem  30 mg Oral Q6H  . docusate sodium  100 mg Oral BID  . metoprolol tartrate  12.5 mg Oral BID  . sodium chloride flush  3 mL Intravenous Q12H  . sodium chloride flush  3 mL Intravenous Q12H  . sodium chloride flush  3 mL Intravenous Q12H   Infusions:  . [START ON 03/24/2016] heparin      Assessment: 60 yo female will be transitioned to xarelto tomorrow am.  CrCl ~59.5 Goal of Therapy:   Monitor platelets by anticoagulation protocol: Yes   Plan:  - At 10 am tomorrow, d/c heparin and transition to xarelto 20 mg po daily  Madison Whitehead, Tsz-Yin 03/23/2016,9:48 PM

## 2016-03-23 NOTE — Progress Notes (Signed)
  Echocardiogram 2D Echocardiogram has been performed.  Tye Savoy 03/23/2016, 10:29 AM

## 2016-03-24 LAB — CBC
HCT: 43.8 % (ref 36.0–46.0)
Hemoglobin: 14.1 g/dL (ref 12.0–15.0)
MCH: 30.7 pg (ref 26.0–34.0)
MCHC: 32.2 g/dL (ref 30.0–36.0)
MCV: 95.2 fL (ref 78.0–100.0)
PLATELETS: 201 10*3/uL (ref 150–400)
RBC: 4.6 MIL/uL (ref 3.87–5.11)
RDW: 13 % (ref 11.5–15.5)
WBC: 9.9 10*3/uL (ref 4.0–10.5)

## 2016-03-24 LAB — BASIC METABOLIC PANEL
ANION GAP: 11 (ref 5–15)
BUN: 18 mg/dL (ref 6–20)
CHLORIDE: 106 mmol/L (ref 101–111)
CO2: 23 mmol/L (ref 22–32)
CREATININE: 1.09 mg/dL — AB (ref 0.44–1.00)
Calcium: 8.8 mg/dL — ABNORMAL LOW (ref 8.9–10.3)
GFR, EST NON AFRICAN AMERICAN: 54 mL/min — AB (ref >60)
Glucose, Bld: 82 mg/dL (ref 65–99)
POTASSIUM: 3.7 mmol/L (ref 3.5–5.1)
Sodium: 140 mmol/L (ref 135–145)

## 2016-03-24 LAB — HEPARIN LEVEL (UNFRACTIONATED): HEPARIN UNFRACTIONATED: 0.55 [IU]/mL (ref 0.30–0.70)

## 2016-03-24 MED ORDER — DILTIAZEM HCL 30 MG PO TABS: 30.0000 mg | ORAL_TABLET | Freq: Four times a day (QID) | ORAL | 1 refills | Status: DC

## 2016-03-24 MED ORDER — RAMIPRIL 1.25 MG PO CAPS: 1.2500 mg | ORAL_CAPSULE | Freq: Every day | ORAL | 3 refills | Status: DC

## 2016-03-24 MED ORDER — METOPROLOL TARTRATE 25 MG PO TABS: 25.0000 mg | ORAL_TABLET | Freq: Two times a day (BID) | ORAL | 3 refills | Status: DC

## 2016-03-24 MED ORDER — RIVAROXABAN 20 MG PO TABS: 20.0000 mg | ORAL_TABLET | Freq: Every day | ORAL | 3 refills | Status: DC

## 2016-03-24 NOTE — Discharge Summary (Signed)
Discharge summary dictated on 03/24/2016 dictation number is [id number]

## 2016-03-24 NOTE — Discharge Instructions (Signed)
Atrial Fibrillation °Atrial fibrillation is a type of heartbeat that is irregular or fast (rapid). If you have this condition, your heart keeps quivering in a weird (chaotic) way. This condition can make it so your heart cannot pump blood normally. Having this condition gives a person more risk for stroke, heart failure, and other heart problems. There are different types of atrial fibrillation. Talk with your doctor to learn about the type that you have. °HOME CARE °· Take over-the-counter and prescription medicines only as told by your doctor. °· If your doctor prescribed a blood-thinning medicine, take it exactly as told. Taking too much of it can cause bleeding. If you do not take enough of it, you will not have the protection that you need against stroke and other problems. °· Do not use any tobacco products. These include cigarettes, chewing tobacco, and e-cigarettes. If you need help quitting, ask your doctor. °· If you have apnea (obstructive sleep apnea), manage it as told by your doctor. °· Do not drink alcohol. °· Do not drink beverages that have caffeine. These include coffee, soda, and tea. °· Maintain a healthy weight. Do not use diet pills unless your doctor says they are safe for you. Diet pills may make heart problems worse. °· Follow diet instructions as told by your doctor. °· Exercise regularly as told by your doctor. °· Keep all follow-up visits as told by your doctor. This is important. °GET HELP IF: °· You notice a change in the speed, rhythm, or strength of your heartbeat. °· You are taking a blood-thinning medicine and you notice more bruising. °· You get tired more easily when you move or exercise. °GET HELP RIGHT AWAY IF: °· You have pain in your chest or your belly (abdomen). °· You have sweating or weakness. °· You feel sick to your stomach (nauseous). °· You notice blood in your throw up (vomit), poop (stool), or pee (urine). °· You are short of breath. °· You suddenly have swollen feet  and ankles. °· You feel dizzy. °· Your suddenly get weak or numb in your face, arms, or legs, especially if it happens on one side of your body. °· You have trouble talking, trouble understanding, or both. °· Your face or your eyelid droops on one side. °These symptoms may be an emergency. Do not wait to see if the symptoms will go away. Get medical help right away. Call your local emergency services (911 in the [place] Do not drive yourself to the hospital. °  °This information is not intended to replace advice given to you by your health care provider. Make sure you discuss any questions you have with your health care provider. °  °Document Released: 03/13/2008 Document Revised: 02/23/2015 Document Reviewed: 09/29/2014 °Elsevier Interactive Patient Education ©2016 Elsevier Inc. ° °

## 2016-03-24 NOTE — Discharge Summary (Signed)
NAMECARLISHA, Madison Whitehead NO.:  [id number]  MEDICAL RECORD NO.:  [id number]  LOCATION:  3W19C                        FACILITY:  [facility]  PHYSICIAN:  Eduardo Osier. Sharyn Lull, M.D. DATE OF BIRTH:  [birth year]  DATE OF ADMISSION:  03/22/2016 DATE OF DISCHARGE:  03/24/2016                              DISCHARGE SUMMARY   ADMITTING DIAGNOSES: 1. Atrial fibrillation with rapid ventricular response. 2. Hypertension. 3. Acute congestive heart failure.  FINAL DIAGNOSES: 1. Chronic atrial fibrillation with controlled ventricular response. 2. Nonischemic cardiomyopathy. 3. Hypertension. 4. Status post acute systolic congestive heart failure. 5. Hyperlipidemia.  DISCHARGE HOME MEDICATIONS: 1. Diltiazem 30 mg 1 tablet every 6 hours. 2. Metoprolol tartrate 25 mg twice daily. 3. Ramipril 1.25 mg 1 capsule daily. 4. Xarelto 20 mg 1 tablet daily. 5. Flonase 2 sprays daily as needed. 6. Claritin 10 mg as needed. 7. Vitamin D 5000 units as before. 8. Crestor 20 mg as before.  DIET:  Low salt, low cholesterol.  ACTIVITY:  Increase activity slowly as tolerated.  FOLLOWUP:  Follow up with Dr. Algie Coffer early next week.  DISCHARGE INSTRUCTIONS:  Post-cardiac cath instructions have been given.  CONDITION AT DISCHARGE:  Stable.  BRIEF HISTORY AND HOSPITAL COURSE:  Ms. Whitehead is a 60 year old female with past medical history significant for hyperlipidemia, was seen in the MD's office because of progressive increasing shortness of breath associated with feeling tired, fatigued, and leg swelling.  The patient was noted to be in AFib with RVR, was referred to ED for admission.  The patient denies any chest pain, nausea, vomiting, or diaphoresis.  PHYSICAL EXAMINATION:  GENERAL:  She was alert, awake, oriented x3. VITAL SIGNS:  Blood pressure was 165/121, pulse 158. EYES:  Conjunctivae pink. NECK:  Supple.  No JVD. LUNGS:  Clear to auscultation bilaterally. CARDIOVASCULAR:   Irregularly irregular.  S1-S2 normal.  There was no murmur, gallop, or rub. ABDOMEN:  Soft.  Bowel sounds are present. EXTREMITIES:  There was no clubbing, cyanosis, or edema. NEUROLOGIC:  Grossly intact.  LABORATORY DATA:  Sodium was 136, potassium 4.1.  BUN 23, creatinine 1.24.  Her glucose was 128, repeat fasting sugar was 96.  BNP was 847. Troponin I was negative.  Hemoglobin was 15.3, hematocrit 46.5, white count of 13.6 with no shift to the left.  TSH was 2.09.  Echocardiogram showed EF of 35-40% with global hypokinesia and mild tricuspid regurgitation.  BRIEF HOSPITAL COURSE:  The patient was admitted to telemetry unit.  She was started on IV Cardizem and then switched to p.o. with fairly well control of heart rate due to depressed systolic LV systolic function and acute heart failure.  The patient subsequently underwent cardiac catheterization by Dr. Algie Coffer which showed no significant coronary artery disease.  The patient remained in atrial fibrillation with controlled ventricular response.  The patient opted for outpatient cardioversion either chemical or electrical after 4 weeks of anticoagulation.  The patient will be discharged home on above medications and will be followed by Dr. Algie Coffer early next week and PMD as scheduled.     Eduardo Osier. Sharyn Lull, M.D.     [facility]  D:  03/24/2016  T:  03/24/2016  Job:  [id number]

## 2016-03-24 NOTE — Care Management Note (Signed)
Case Management Note  Patient Details  Name: Madison Whitehead MRN: [id number] Date of Birth: 06-21-1955  Subjective/Objective:                  Chief Complaint: Shortness of breath and palpitation; Afib   Action/Plan: Cm spoke to patient and husband at the bedside regarding Xarelto and provided starter kit for Afib along with 30 day free copay card and $10/month copay card. Cm spoke about risk of bleeding and importance of taking the medication as directed. Patient denied further needs for discharge and is planning to be discharged home today.   Expected Discharge Date:  03/24/16               Expected Discharge Plan:  Home/Self Care  In-House Referral:     Discharge planning Services  CM Consult, Medication Assistance  Post Acute Care Choice:    Choice offered to:  Patient  DME Arranged:    DME Agency:     HH Arranged:    [place] Agency:     Status of Service:  Completed, signed off  If discussed at [employer] of Stay Meetings, dates discussed:    Additional Comments:  Guido Sander, RN 03/24/2016, 9:57 AM

## 2016-03-26 ENCOUNTER — Encounter (HOSPITAL_COMMUNITY): Payer: Self-pay | Admitting: Cardiovascular Disease

## 2016-03-27 ENCOUNTER — Other Ambulatory Visit: Payer: Self-pay | Admitting: Internal Medicine

## 2016-03-29 DIAGNOSIS — I481 Persistent atrial fibrillation: Secondary | ICD-10-CM | POA: Diagnosis not present

## 2016-03-29 DIAGNOSIS — I5022 Chronic systolic (congestive) heart failure: Secondary | ICD-10-CM | POA: Diagnosis not present

## 2016-03-29 DIAGNOSIS — E784 Other hyperlipidemia: Secondary | ICD-10-CM | POA: Diagnosis not present

## 2016-03-29 DIAGNOSIS — I10 Essential (primary) hypertension: Secondary | ICD-10-CM | POA: Diagnosis not present

## 2016-04-05 DIAGNOSIS — I481 Persistent atrial fibrillation: Secondary | ICD-10-CM | POA: Diagnosis not present

## 2016-04-05 DIAGNOSIS — I10 Essential (primary) hypertension: Secondary | ICD-10-CM | POA: Diagnosis not present

## 2016-04-05 DIAGNOSIS — I5022 Chronic systolic (congestive) heart failure: Secondary | ICD-10-CM | POA: Diagnosis not present

## 2016-04-05 DIAGNOSIS — E784 Other hyperlipidemia: Secondary | ICD-10-CM | POA: Diagnosis not present

## 2016-04-23 ENCOUNTER — Ambulatory Visit (INDEPENDENT_AMBULATORY_CARE_PROVIDER_SITE_OTHER): Payer: BLUE CROSS/BLUE SHIELD | Admitting: Internal Medicine

## 2016-04-23 ENCOUNTER — Encounter: Payer: Self-pay | Admitting: Internal Medicine

## 2016-04-23 VITALS — BP 124/68 | HR 80 | Temp 98.2°F | Resp 16 | Ht 66.25 in | Wt 170.0 lb

## 2016-04-23 DIAGNOSIS — I4891 Unspecified atrial fibrillation: Secondary | ICD-10-CM

## 2016-04-23 NOTE — Progress Notes (Signed)
Assessment and Plan:   1. Atrial fibrillation, unspecified type (HCC) -anticoagulated -rate appears normal here on exam, but EKG not performed as she will follow-up with cards tomorrow -cont rate control medications -encourage to gradually work back into exercise classes.      HPI 60 y.o.female presents for 1 month follow up of hospitalization for atrial fibrillation.  She reports that they have gotten her rate controlled with her current oral medications.  She is not having any difficulty including dizziness, lightheadedness, shortness of breath or any other adverse reactions.  She has not returned to full normal activities and is avoiding exercise right now as it makes her feel really nervous that this might trigger an arrythmia.  She is on eliquis.  She has not fallen or had any bleeding issues on the medications.  She is due to see Dr. Algie Coffer tomorrow for follow-up and possible evaluation for cardioversion if still not flipped back into NSR.    Past Medical History:  Diagnosis Date  . Atrial fibrillation (HCC) 03/22/2016   NEW ONSET   . Hyperlipidemia   . Hypertension   . Pre-diabetes   . Vitamin D deficiency      Allergies  Allergen Reactions  . Codeine     REACTION: TREMORS/NERVOUS  . Penicillins     REACTION: RASH/SWELLING   Current Outpatient Prescriptions on File Prior to Visit  Medication Sig Dispense Refill  . Cholecalciferol (VITAMIN D PO) Take 5,000 Units by mouth 2 (two) times daily.    . fluticasone (FLONASE) 50 MCG/ACT nasal spray Place 2 sprays into both nostrils daily. 16 g 2  . loratadine (CLARITIN) 10 MG tablet Take 10 mg by mouth daily.    . ramipril (ALTACE) 1.25 MG capsule Take 1 capsule (1.25 mg total) by mouth daily. 30 capsule 3  . rivaroxaban (XARELTO) 20 MG TABS tablet Take 1 tablet (20 mg total) by mouth daily. 30 tablet 3  . rosuvastatin (CRESTOR) 40 MG tablet TAKE 1 TABLET BY MOUTH DAILY 30 tablet 2   No current facility-administered medications  on file prior to visit.     ROS: all negative except above.   Physical Exam: Filed Weights   04/23/16 1640  Weight: 170 lb (77.1 kg)   BP 124/68   Pulse 80   Temp 98.2 F (36.8 C) (Temporal)   Resp 16   Ht 5' 6.25" (1.683 m)   Wt 170 lb (77.1 kg)   BMI 27.23 kg/m  General Appearance: Well developed well nourished, non-toxic appearing in no apparent distress. Eyes: PERRLA, EOMs, conjunctiva w/ no swelling or erythema or discharge Sinuses: No Frontal/maxillary tenderness ENT/Mouth: Ear canals clear without swelling or erythema.  TM's normal bilaterally with no retractions, bulging, or loss of landmarks.   Neck: Supple, thyroid normal, no notable JVD  Respiratory: Respiratory effort normal, Clear breath sounds anteriorly and posteriorly bilaterally without rales, rhonchi, wheezing or stridor. No retractions or accessory muscle usage. Cardio: RRR with no MRGs.   Abdomen: Soft, + BS.  Non tender, no guarding, rebound, hernias, masses.  Musculoskeletal: Full ROM, 5/5 strength, normal gait.  Skin: Warm, dry without rashes  Neuro: Awake and oriented X 3, Cranial nerves intact. Normal muscle tone, no cerebellar symptoms. Sensation intact.  Psych: normal affect, Insight and Judgment appropriate.     Terri Piedra, PA-C 5:20 PM [facility]

## 2016-04-23 NOTE — Patient Instructions (Signed)
Look at purchasing a pulse oximeter to help check your pulse or you can count your pulse.  Consistent pulse rate of 125 or more at rest is a reason to go to the ER.

## 2016-04-26 DIAGNOSIS — I481 Persistent atrial fibrillation: Secondary | ICD-10-CM | POA: Diagnosis not present

## 2016-04-26 DIAGNOSIS — I10 Essential (primary) hypertension: Secondary | ICD-10-CM | POA: Diagnosis not present

## 2016-04-26 DIAGNOSIS — E784 Other hyperlipidemia: Secondary | ICD-10-CM | POA: Diagnosis not present

## 2016-04-26 DIAGNOSIS — I5022 Chronic systolic (congestive) heart failure: Secondary | ICD-10-CM | POA: Diagnosis not present

## 2016-05-29 DIAGNOSIS — I5022 Chronic systolic (congestive) heart failure: Secondary | ICD-10-CM | POA: Diagnosis not present

## 2016-05-29 DIAGNOSIS — I10 Essential (primary) hypertension: Secondary | ICD-10-CM | POA: Diagnosis not present

## 2016-05-29 DIAGNOSIS — I481 Persistent atrial fibrillation: Secondary | ICD-10-CM | POA: Diagnosis not present

## 2016-05-29 DIAGNOSIS — E784 Other hyperlipidemia: Secondary | ICD-10-CM | POA: Diagnosis not present

## 2016-06-06 DIAGNOSIS — E784 Other hyperlipidemia: Secondary | ICD-10-CM | POA: Diagnosis not present

## 2016-06-06 DIAGNOSIS — I10 Essential (primary) hypertension: Secondary | ICD-10-CM | POA: Diagnosis not present

## 2016-06-06 DIAGNOSIS — I481 Persistent atrial fibrillation: Secondary | ICD-10-CM | POA: Diagnosis not present

## 2016-06-06 DIAGNOSIS — I5022 Chronic systolic (congestive) heart failure: Secondary | ICD-10-CM | POA: Diagnosis not present

## 2016-07-10 DIAGNOSIS — I5022 Chronic systolic (congestive) heart failure: Secondary | ICD-10-CM | POA: Diagnosis not present

## 2016-07-10 DIAGNOSIS — I481 Persistent atrial fibrillation: Secondary | ICD-10-CM | POA: Diagnosis not present

## 2016-07-10 DIAGNOSIS — I10 Essential (primary) hypertension: Secondary | ICD-10-CM | POA: Diagnosis not present

## 2016-07-10 DIAGNOSIS — E784 Other hyperlipidemia: Secondary | ICD-10-CM | POA: Diagnosis not present

## 2016-07-13 ENCOUNTER — Ambulatory Visit (HOSPITAL_COMMUNITY): Payer: BLUE CROSS/BLUE SHIELD | Admitting: Anesthesiology

## 2016-07-13 ENCOUNTER — Encounter (HOSPITAL_COMMUNITY)
Admission: RE | Disposition: A | Discharge status: Home / Self Care | Payer: Self-pay | Source: Ambulatory Visit | Attending: Cardiovascular Disease

## 2016-07-13 ENCOUNTER — Encounter (HOSPITAL_COMMUNITY): Payer: Self-pay | Admitting: *Deleted

## 2016-07-13 ENCOUNTER — Ambulatory Visit (HOSPITAL_COMMUNITY)
Admission: RE | Admit: 2016-07-13 | Discharge: 2016-07-13 | Disposition: A | Discharge status: Home / Self Care | Payer: BLUE CROSS/BLUE SHIELD | Source: Ambulatory Visit | Attending: Cardiovascular Disease | Admitting: Cardiovascular Disease

## 2016-07-13 DIAGNOSIS — E785 Hyperlipidemia, unspecified: Secondary | ICD-10-CM | POA: Insufficient documentation

## 2016-07-13 DIAGNOSIS — I4891 Unspecified atrial fibrillation: Secondary | ICD-10-CM | POA: Diagnosis not present

## 2016-07-13 DIAGNOSIS — I482 Chronic atrial fibrillation: Secondary | ICD-10-CM | POA: Diagnosis not present

## 2016-07-13 DIAGNOSIS — Z7951 Long term (current) use of inhaled steroids: Secondary | ICD-10-CM | POA: Diagnosis not present

## 2016-07-13 DIAGNOSIS — I10 Essential (primary) hypertension: Secondary | ICD-10-CM | POA: Insufficient documentation

## 2016-07-13 DIAGNOSIS — Z7901 Long term (current) use of anticoagulants: Secondary | ICD-10-CM | POA: Diagnosis not present

## 2016-07-13 DIAGNOSIS — Z79899 Other long term (current) drug therapy: Secondary | ICD-10-CM | POA: Insufficient documentation

## 2016-07-13 HISTORY — PX: CARDIOVERSION: SHX1299

## 2016-07-13 LAB — BASIC METABOLIC PANEL
ANION GAP: 8 (ref 5–15)
BUN: 18 mg/dL (ref 6–20)
CALCIUM: 9.8 mg/dL (ref 8.9–10.3)
CO2: 27 mmol/L (ref 22–32)
CREATININE: 1.12 mg/dL — AB (ref 0.44–1.00)
Chloride: 106 mmol/L (ref 101–111)
GFR, EST NON AFRICAN AMERICAN: 52 mL/min — AB (ref >60)
Glucose, Bld: 101 mg/dL — ABNORMAL HIGH (ref 65–99)
Potassium: 4.1 mmol/L (ref 3.5–5.1)
Sodium: 141 mmol/L (ref 135–145)

## 2016-07-13 LAB — CBC
HCT: 40.9 % (ref 36.0–46.0)
HEMOGLOBIN: 14.2 g/dL (ref 12.0–15.0)
MCH: 31.9 pg (ref 26.0–34.0)
MCHC: 34.7 g/dL (ref 30.0–36.0)
MCV: 91.9 fL (ref 78.0–100.0)
PLATELETS: 213 10*3/uL (ref 150–400)
RBC: 4.45 MIL/uL (ref 3.87–5.11)
RDW: 13.2 % (ref 11.5–15.5)
WBC: 6.7 10*3/uL (ref 4.0–10.5)

## 2016-07-13 SURGERY — CARDIOVERSION
Anesthesia: General

## 2016-07-13 MED ORDER — PROPOFOL 10 MG/ML IV BOLUS
INTRAVENOUS | Status: DC | PRN
  Administered 2016-07-13: 70 mg via INTRAVENOUS

## 2016-07-13 MED ORDER — LIDOCAINE HCL (CARDIAC) 20 MG/ML IV SOLN
INTRAVENOUS | Status: DC | PRN
  Administered 2016-07-13: 80 mg via INTRATRACHEAL

## 2016-07-13 MED ORDER — METOPROLOL TARTRATE 5 MG/5ML IV SOLN
5.0000 mg | Freq: Once | INTRAVENOUS | Status: AC
  Administered 2016-07-13: 5 mg via INTRAVENOUS

## 2016-07-13 MED ORDER — SODIUM CHLORIDE 0.9 % IV SOLN
INTRAVENOUS | Status: DC
  Administered 2016-07-13: 07:00:00 via INTRAVENOUS

## 2016-07-13 MED ORDER — METOPROLOL TARTRATE 5 MG/5ML IV SOLN
INTRAVENOUS | Status: AC
  Filled 2016-07-13: qty 5

## 2016-07-13 NOTE — Discharge Instructions (Signed)
Electrical Cardioversion, Care After °This sheet gives you information about how to care for yourself after your procedure. Your health care provider may also give you more specific instructions. If you have problems or questions, contact your health care provider. °What can I expect after the procedure? °After the procedure, it is common to have: °· Some redness on the skin where the shocks were given. °Follow these instructions at home: °· Do not drive for 24 hours if you were given a medicine to help you relax (sedative). °· Take over-the-counter and prescription medicines only as told by your health care provider. °· Ask your health care provider how to check your pulse. Check it often. °· Rest for 48 hours after the procedure or as told by your health care provider. °· Avoid or limit your caffeine use as told by your health care provider. °Contact a health care provider if: °· You feel like your heart is beating too quickly or your pulse is not regular. °· You have a serious muscle cramp that does not go away. °Get help right away if: °· You have discomfort in your chest. °· You are dizzy or you feel faint. °· You have trouble breathing or you are short of breath. °· Your speech is slurred. °· You have trouble moving an arm or leg on one side of your body. °· Your fingers or toes turn cold or blue. °This information is not intended to replace advice given to you by your health care provider. Make sure you discuss any questions you have with your health care provider. °Document Released: 03/25/2013 Document Revised: 01/06/2016 Document Reviewed: 12/09/2015 °Elsevier Interactive Patient Education © 2017 Elsevier Inc. ° ° ° ° ° ° ° ° ° ° ° ° ° ° ° ° ° ° ° ° ° ° ° ° ° ° ° °Monitored Anesthesia Care, Care After °These instructions provide you with information about caring for yourself after your procedure. Your health care provider may also give you more specific instructions. Your treatment has been planned  according to current medical practices, but problems sometimes occur. Call your health care provider if you have any problems or questions after your procedure. °What can I expect after the procedure? °After your procedure, it is common to: °· Feel sleepy for several hours. °· Feel clumsy and have poor balance for several hours. °· Feel forgetful about what happened after the procedure. °· Have poor judgment for several hours. °· Feel nauseous or vomit. °· Have a sore throat if you had a breathing tube during the procedure. °Follow these instructions at home: °For at least 24 hours after the procedure:  °· Do not: °¨ Participate in activities in which you could fall or become injured. °¨ Drive. °¨ Use heavy machinery. °¨ Drink alcohol. °¨ Take sleeping pills or medicines that cause drowsiness. °¨ Make important decisions or sign legal documents. °¨ Take care of children on your own. °· Rest. °Eating and drinking °· Follow the diet that is recommended by your health care provider. °· If you vomit, drink water, juice, or soup when you can drink without vomiting. °· Make sure you have little or no nausea before eating solid foods. °General instructions °· Have a responsible adult stay with you until you are awake and alert. °· Take over-the-counter and prescription medicines only as told by your health care provider. °· If you smoke, do not smoke without supervision. °· Keep all follow-up visits as told by your health care provider. This is important. °  Contact a health care provider if: °· You keep feeling nauseous or you keep vomiting. °· You feel light-headed. °· You develop a rash. °· You have a fever. °Get help right away if: °· You have trouble breathing. °This information is not intended to replace advice given to you by your health care provider. Make sure you discuss any questions you have with your health care provider. °Document Released: 09/25/2015 Document Revised: 01/25/2016 Document Reviewed:  09/25/2015 °Elsevier Interactive Patient Education © 2017 Elsevier Inc. ° °

## 2016-07-13 NOTE — Anesthesia Postprocedure Evaluation (Signed)
Anesthesia Post Note  Patient: Madison Whitehead  Procedure(s) Performed: Procedure(s) (LRB): CARDIOVERSION (N/A)  Patient location during evaluation: PACU Anesthesia Type: General Level of consciousness: awake and alert Pain management: pain level controlled Vital Signs Assessment: post-procedure vital signs reviewed and stable Respiratory status: spontaneous breathing, nonlabored ventilation, respiratory function stable and patient connected to nasal cannula oxygen Cardiovascular status: blood pressure returned to baseline and stable Postop Assessment: no signs of nausea or vomiting Anesthetic complications: no       Last Vitals:  Vitals:   07/13/16 0828 07/13/16 0838  BP: (!) 151/85 (!) 164/86  Pulse: (!) 59 (!) 55  Resp: 17 10  Temp:      Last Pain:  Vitals:   07/13/16 0811  TempSrc: Oral                 Zacharias Ridling DAVID

## 2016-07-13 NOTE — CV Procedure (Signed)
PRE-OP DIAGNOSIS:  Atrial fibrillation, chronic.  POST-OP DIAGNOSIS:  Sinus rhythm.  OPERATOR:  Orpah Cobb, MD.     ANESTHESIA:  Lidocaine 2 % 80 mg. and Propofol 70 mg. IV. COMPLICATIONS:  None.   OPERATIVE TERM:  DC cardioversion.  The nature of the procedure, risks and alternatives were discussed with the patient who gave informed consent.  OPERATIVE TECHNIQUE:  The patient was sedated with Propofol.  When the patient was no longer responsive to quiet voice, DC cardioversion was performed with 120 J biphasically and synchronously.  Sinus rhythm was restored successfully. The patient was then monitored until fully alert and left the procedure area in stable condition.  IMPRESSION:  Successful DC cardioversion.

## 2016-07-13 NOTE — H&P (Signed)
Referring Physician:  [name] is an 61 y.o. female.                       Chief Complaint: Atrial fibrillation  HPI: 61 year old female is here for external cardioversion for chronic atrial fibrillation. She is on diltiazem, metoprolol and Xarelto for 3 months. No chest pain or palpitation.  Past Medical History:  Diagnosis Date  . Atrial fibrillation (HCC) 03/22/2016   NEW ONSET   . Hyperlipidemia   . Hypertension   . Pre-diabetes   . Vitamin D deficiency       Past Surgical History:  Procedure Laterality Date  . CARDIAC CATHETERIZATION N/A 03/23/2016   Procedure: Right/Left Heart Cath and Coronary Angiography;  Surgeon: Orpah Cobb, MD;  Location: [facility] INVASIVE CV [facility];  Service: Cardiovascular;  Laterality: N/A;  . CYST FROM WRIST Right   . TUBAL LIGATION      History reviewed. No pertinent family history. Social History:  reports that she has never smoked. She has never used smokeless tobacco. She reports that she drinks alcohol. She reports that she does not use drugs.  Allergies:  Allergies  Allergen Reactions  . Codeine     REACTION: TREMORS/NERVOUS  . Penicillins     REACTION: RASH/SWELLING    Medications Prior to Admission  Medication Sig Dispense Refill  . Cholecalciferol (VITAMIN D PO) Take 5,000 Units by mouth 2 (two) times daily.    Marland Kitchen diltiazem (DILACOR XR) 240 MG 24 hr capsule Take 240 mg by mouth daily.    . fluticasone (FLONASE) 50 MCG/ACT nasal spray Place 2 sprays into both nostrils daily. 16 g 2  . loratadine (CLARITIN) 10 MG tablet Take 10 mg by mouth daily.    . metoprolol (LOPRESSOR) 50 MG tablet Take 50 mg by mouth 2 (two) times daily.    . ramipril (ALTACE) 1.25 MG capsule Take 1 capsule (1.25 mg total) by mouth daily. 30 capsule 3  . rivaroxaban (XARELTO) 20 MG TABS tablet Take 1 tablet (20 mg total) by mouth daily. 30 tablet 3  . rosuvastatin (CRESTOR) 40 MG tablet TAKE 1 TABLET BY MOUTH DAILY 30 tablet 2    No results found for this or  any previous visit (from the past 48 hour(s)). No results found.  Review Of Systems Constitutional: No fever, chills , weight loss or gain. Eyes: No vision change, Wears glasses. No discharge or pain.. Ears: No hearing loss, No tinnitus. Respiratory: No asthma, COPD, pneumonias or shortness of breath. No hemoptysis. Cardiovascular: No chest pain, occasional palpitation. No leg edema. Gastrointestinal: No nausea, vomiting or diarrhea or constipation. No GI bleed. No hepatitis. Genitourinary: No dysuria, hematuria or kidney stone. No incontinance. Neurological: No headache, stroke or seizures.  Psychiatry: No psych facility admission for anxiety, depression or suicide. No detox. Skin: No rash. Musculoskeletal: No joint pain or fibromyalgia. No neck pain or back pain. Lymphadenopathy: No lymphadenopathy Hematology: No anemia or easy bruising.   Blood pressure (!) 174/98, pulse 88, resp. rate 15, height 5' 6.5" (1.689 m), weight 77.1 kg (170 lb), SpO2 99 %. Body mass index is 27.03 kg/m. General appearance: alert, cooperative, appears stated age and no distress Head: Normocephalic, atraumatic. Eyes: conjunctivae/corneas clear. PERRL, EOM's intact. Fundi benign. Neck: no adenopathy, no carotid bruit, no JVD, supple, symmetrical, trachea midline and thyroid not enlarged. Resp: clear to auscultation bilaterally. Cardio: regular rate and rhythm, S1, S2 normal, II/VI systolic murmur, no click, rub or gallop GI: soft,  non-tender; bowel sounds normal; no masses,  no organomegaly Extremities: extremities normal, atraumatic, no cyanosis or edema Skin: Warm and dry. No rashes or lesions Neurologic: Alert and oriented X 3, normal strength and tone. Normal coordination and gait.  Assessment/Plan Chronic atrial fibrillation Hypertension Hyperlipidemia  External cardioversion today.  Ricki Rodriguez, MD  07/13/2016, 7:15 AM

## 2016-07-13 NOTE — Anesthesia Preprocedure Evaluation (Signed)
Anesthesia Evaluation  Patient identified by MRN, date of birth, ID band Patient awake    Reviewed: Allergy & Precautions, NPO status , Patient's Chart, lab work & pertinent test results  Airway Mallampati: I  TM Distance: >3 FB Neck ROM: Full    Dental   Pulmonary    Pulmonary exam normal        Cardiovascular hypertension, Pt. on medications Normal cardiovascular exam     Neuro/Psych    GI/Hepatic   Endo/Other    Renal/GU      Musculoskeletal   Abdominal   Peds  Hematology   Anesthesia Other Findings   Reproductive/Obstetrics                             Anesthesia Physical Anesthesia Plan  ASA: III  Anesthesia Plan: General   Post-op Pain Management:    Induction: Intravenous  Airway Management Planned: Mask  Additional Equipment:   Intra-op Plan:   Post-operative Plan:   Informed Consent: I have reviewed the patients History and Physical, chart, labs and discussed the procedure including the risks, benefits and alternatives for the proposed anesthesia with the patient or authorized representative who has indicated his/her understanding and acceptance.     Plan Discussed with: CRNA and Surgeon  Anesthesia Plan Comments:         Anesthesia Quick Evaluation  

## 2016-07-13 NOTE — Transfer of Care (Signed)
Immediate Anesthesia Transfer of Care Note  Patient: Madison Whitehead  Procedure(s) Performed: Procedure(s): CARDIOVERSION (N/A)  Patient Location: Endoscopy Unit  Anesthesia Type:MAC  Level of Consciousness: awake, alert  and oriented  Airway & Oxygen Therapy: Patient Spontanous Breathing  Post-op Assessment: Report given to RN, Post -op Vital signs reviewed and stable and Patient moving all extremities X 4  Post vital signs: Reviewed and stable  Last Vitals:  Vitals:   07/13/16 0656  BP: (!) 174/98  Pulse: 88  Resp: 15    Last Pain:  Vitals:   07/13/16 0656  TempSrc: Oral         Complications: No apparent anesthesia complications

## 2016-07-14 ENCOUNTER — Encounter (HOSPITAL_COMMUNITY): Payer: Self-pay | Admitting: Cardiovascular Disease

## 2016-07-20 DIAGNOSIS — I5022 Chronic systolic (congestive) heart failure: Secondary | ICD-10-CM | POA: Diagnosis not present

## 2016-07-20 DIAGNOSIS — I10 Essential (primary) hypertension: Secondary | ICD-10-CM | POA: Diagnosis not present

## 2016-07-20 DIAGNOSIS — I481 Persistent atrial fibrillation: Secondary | ICD-10-CM | POA: Diagnosis not present

## 2016-07-20 DIAGNOSIS — E784 Other hyperlipidemia: Secondary | ICD-10-CM | POA: Diagnosis not present

## 2016-08-31 DIAGNOSIS — Z01419 Encounter for gynecological examination (general) (routine) without abnormal findings: Secondary | ICD-10-CM | POA: Diagnosis not present

## 2016-08-31 DIAGNOSIS — Z6828 Body mass index (BMI) 28.0-28.9, adult: Secondary | ICD-10-CM | POA: Diagnosis not present

## 2016-08-31 DIAGNOSIS — Z1231 Encounter for screening mammogram for malignant neoplasm of breast: Secondary | ICD-10-CM | POA: Diagnosis not present

## 2016-09-04 NOTE — Patient Instructions (Signed)

## 2016-09-04 NOTE — Progress Notes (Signed)
[facility] Lucky Cowboy, M.D.    Dyanne Carrel. Steffanie Dunn, P.A.-C      Terri Piedra, P.A.-C  [facility]                [address]                [place], [place]. [address] Telephone [phone] Telefax [phone]  Annual Screening/Preventative Visit & Comprehensive Evaluation &  Examination     This very nice 61 y.o. MWF presents for a Screening/Preventative Visit & comprehensive evaluation and management of multiple medical co-morbidities.  Patient has been followed for HTN, ASHD/pAfib, Prediabetes, Hyperlipidemia and Vitamin D Deficiency.      Patient has hx/o labile HTN predates since 2007, then treated off & on from 2009 then resumed med in Feb 2017. Patient's BP has been controlled at home. On Oct 6,2017, patient presented with new Afib and was hospitalized for rate control with iv then po Diltiazem and started on Xarelto and had Cardiac Cath which found no significant CAD. Then patient had elective Cardioversion by Dr Orpah Cobb.  Since then patient has done well &  denies any cardiac symptoms as chest pain, palpitations, shortness of breath, dizziness or ankle swelling. Today's BP is borderline elevated - 132/88.      Patient's hyperlipidemia is controlled with diet and medications in the past, but now admits that she's been off of her rosuvastatin x 3 months. Patient denies myalgias or other medication SE's. Last lipids were at goal: Lab Results  Component Value Date   CHOL 156 11/09/2015   HDL 62 11/09/2015   LDLCALC 79 11/09/2015   TRIG 74 11/09/2015   CHOLHDL 2.5 11/09/2015      Patient is monitored proactively & expectantly for prediabetes and patient denies reactive hypoglycemic symptoms, visual blurring, diabetic polys, or paresthesias. Last A1c was  Lab Results  Component Value Date   HGBA1C 5.6 08/05/2015      Finally, patient has history of Vitamin D Deficiency ("10" in 2008) and last Vitamin  D was still low (goal 70-100): Lab Results  Component Value Date   VD25OH 52 08/05/2015   Current Outpatient Prescriptions on File Prior to Visit  Medication Sig  . VITAMIN D Take 5,000 Units by mouth 2 (two) times daily.  . Diltiazem-XR 240 MG Take 240 mg by mouth daily.  Marland Kitchen FLONASE nasal spray Place 2 sprays into both nostrils daily.  Marland Kitchen loratadine 10 MG Take 10 mg by mouth daily.  . Metoprolol 50 MG Take 50 mg by mouth 2 (two) times daily.  . ramipril  1.25 MG Take 1 capsule (1.25 mg total) by mouth daily.  Carlena Hurl 20 MG  Take 1 tablet (20 mg total) by mouth daily.  . Rosuvastatin  40 MG TAKE 1 TABLET BY MOUTH DAILY   Allergies  Allergen Reactions  . Codeine TREMORS/NERVOUS  . Penicillins RASH/SWELLING   Past Medical History:  Diagnosis Date  . Atrial fibrillation (HCC)  NEW ONSET  03/22/2016  . Hyperlipidemia   . Hypertension   . Pre-diabetes   . Vitamin D deficiency    Health Maintenance  Topic Date Due  . Hepatitis C Screening  [birth year]  . HIV Screening  04/11/1970  . TETANUS/TDAP  04/11/1974  . MAMMOGRAM  07/18/2013  . INFLUENZA VACCINE  01/17/2016  . PAP SMEAR  07/04/2018  . COLONOSCOPY  09/07/2019   Immunization History  Administered Date(s) Administered  . PPD Test 08/05/2015  Past Surgical History:  Procedure Laterality Date  . CARDIAC CATHETERIZATION N/A 03/23/2016   Procedure: Right/Left Heart Cath and Coronary Angiography;  Surgeon: Orpah Cobb, MD;  [facility]: [facility];  Service: Cardiovascular;  Laterality: N/A;  . CARDIOVERSION N/A 07/13/2016   Procedure: CARDIOVERSION;  Surgeon: Orpah Cobb, MD;  Location: [facility];  Service: Cardiovascular;  Laterality: N/A;  . CYST FROM WRIST Right   . TUBAL LIGATION     Social History  Substance Use Topics  . Smoking status: Never Smoker  . Smokeless tobacco: Never Used  . Alcohol use 0.0 oz/week  social    ROS Constitutional: Denies fever, chills, weight loss/gain, headaches, insomnia,   night sweats, and change in appetite. Does c/o fatigue. Eyes: Denies redness, blurred vision, diplopia, discharge, itchy, watery eyes.  ENT: Denies discharge, congestion, post nasal drip, epistaxis, sore throat, earache, hearing loss, dental pain, Tinnitus, Vertigo, Sinus pain, snoring.  Cardio: Denies chest pain, palpitations, irregular heartbeat, syncope, dyspnea, diaphoresis, orthopnea, PND, claudication, edema Respiratory: denies cough, dyspnea, DOE, pleurisy, hoarseness, laryngitis, wheezing.  Gastrointestinal: Denies dysphagia, heartburn, reflux, water brash, pain, cramps, nausea, vomiting, bloating, diarrhea, constipation, hematemesis, melena, hematochezia, jaundice, hemorrhoids Genitourinary: Denies dysuria, frequency, urgency, nocturia, hesitancy, discharge, hematuria, flank pain Breast: Breast lumps, nipple discharge, bleeding.  Musculoskeletal: Denies arthralgia, myalgia, stiffness, Jt. Swelling, pain, limp, and strain/sprain. Denies falls. Skin: Denies puritis, rash, hives, warts, acne, eczema, changing in skin lesion Neuro: No weakness, tremor, incoordination, spasms, paresthesia, pain Psychiatric: Denies confusion, memory loss, sensory loss. Denies Depression. Endocrine: Denies change in weight, skin, hair change, nocturia, and paresthesia, diabetic polys, visual blurring, hyper / hypo glycemic episodes.  Heme/Lymph: No excessive bleeding, bruising, enlarged lymph nodes.  Physical Exam  BP 132/88   Pulse (!) 56   Temp 97.3 F (36.3 C)   Resp 16   Ht 5\' 6"  (1.676 m)   Wt 169 lb 9.6 oz (76.9 kg)   BMI 27.37 kg/m   General Appearance: Well nourished and in no apparent distress.  Eyes: PERRLA, EOMs, conjunctiva no swelling or erythema, normal fundi and vessels. Sinuses: No frontal/maxillary tenderness ENT/Mouth: EACs patent / TMs  nl. Nares clear without erythema, swelling, mucoid exudates. Oral hygiene is good. No erythema, swelling, or exudate. Tongue normal,  non-obstructing. Tonsils not swollen or erythematous. Hearing normal.  Neck: Supple, thyroid normal. No bruits, nodes or JVD. Respiratory: Respiratory effort normal.  BS equal and clear bilateral without rales, rhonci, wheezing or stridor. Cardio: Heart sounds are normal with regular rate and rhythm and no murmurs, rubs or gallops. Peripheral pulses are normal and equal bilaterally without edema. No aortic or femoral bruits. Chest: symmetric with normal excursions and percussion. Breasts: Deferred to GYN - had Pap & MGM on 08/31/16. Abdomen: Flat, soft with bowel sounds active. Nontender, no guarding, rebound, hernias, masses, or organomegaly.  Lymphatics: Non tender without lymphadenopathy.   Musculoskeletal: Full ROM all peripheral extremities, joint stability, 5/5 strength, and normal gait. Skin: Warm and dry without rashes, lesions, cyanosis, clubbing or  ecchymosis.  Neuro: Cranial nerves intact, reflexes equal bilaterally. Normal muscle tone, no cerebellar symptoms. Sensation intact.  Pysch: Alert and oriented X 3, normal affect, Insight and Judgment appropriate.   Assessment and Plan  1. Annual Preventative Screening Examination   2. Essential hypertension  - EKG 12-Lead - [place], RETROPERITNL ABD,  LTD - Urinalysis, Routine w reflex microscopic - Microalbumin / creatinine urine ratio - CBC with Differential/Platelet - BASIC METABOLIC PANEL WITH GFR - Magnesium - TSH  3. Mixed hyperlipidemia  - EKG 12-Lead - [place], RETROPERITNL ABD,  LTD - Hepatic function panel - Lipid panel - TSH  4. Prediabetes  - EKG 12-Lead - [place], RETROPERITNL ABD,  LTD - Hemoglobin A1c - Insulin, random  5. Vitamin D deficiency  - VITAMIN D 25 Hydroxy   6. Atrial fibrillation, hx/o (HCC)  - EKG 12-Lead  7. Screening for colorectal cancer  - POC Hemoccult Bld/Stl   8. Screening for ischemic heart disease  - EKG 12-Lead  9. Screening for AAA (aortic abdominal aneurysm)  - [place],  RETROPERITNL ABD,  LTD  10. Fatigue, unspecified type  - Vitamin B12 - Iron and TIBC - CBC with Differential/Platelet  11. Medication management  - Microalbumin / creatinine urine ratio - CBC with Differential/Platelet - BASIC METABOLIC PANEL WITH GFR - Hepatic function panel - Magnesium - Lipid panel - TSH - Hemoglobin A1c - Insulin, random - VITAMIN D 25 Hydroxy   12. Screening examination for pulmonary tuberculosis  - PPD      Continue prudent diet as discussed, weight control, BP monitoring, regular exercise, and medications. Discussed med's effects and SE's. Screening labs and tests as requested with regular follow-up as recommended. Over 40 minutes of exam, counseling, chart review and high complex critical decision making was performed.

## 2016-09-05 ENCOUNTER — Ambulatory Visit (INDEPENDENT_AMBULATORY_CARE_PROVIDER_SITE_OTHER): Payer: BLUE CROSS/BLUE SHIELD | Admitting: Internal Medicine

## 2016-09-05 ENCOUNTER — Encounter: Payer: Self-pay | Admitting: Internal Medicine

## 2016-09-05 VITALS — BP 132/88 | HR 56 | Temp 97.3°F | Resp 16 | Ht 66.0 in | Wt 169.6 lb

## 2016-09-05 DIAGNOSIS — Z0001 Encounter for general adult medical examination with abnormal findings: Secondary | ICD-10-CM

## 2016-09-05 DIAGNOSIS — Z1211 Encounter for screening for malignant neoplasm of colon: Secondary | ICD-10-CM

## 2016-09-05 DIAGNOSIS — E782 Mixed hyperlipidemia: Secondary | ICD-10-CM

## 2016-09-05 DIAGNOSIS — Z1212 Encounter for screening for malignant neoplasm of rectum: Secondary | ICD-10-CM

## 2016-09-05 DIAGNOSIS — E559 Vitamin D deficiency, unspecified: Secondary | ICD-10-CM | POA: Diagnosis not present

## 2016-09-05 DIAGNOSIS — Z136 Encounter for screening for cardiovascular disorders: Secondary | ICD-10-CM

## 2016-09-05 DIAGNOSIS — I481 Persistent atrial fibrillation: Secondary | ICD-10-CM | POA: Diagnosis not present

## 2016-09-05 DIAGNOSIS — Z79899 Other long term (current) drug therapy: Secondary | ICD-10-CM

## 2016-09-05 DIAGNOSIS — E60 Dietary zinc deficiency: Secondary | ICD-10-CM

## 2016-09-05 DIAGNOSIS — R5383 Other fatigue: Secondary | ICD-10-CM

## 2016-09-05 DIAGNOSIS — Z111 Encounter for screening for respiratory tuberculosis: Secondary | ICD-10-CM | POA: Diagnosis not present

## 2016-09-05 DIAGNOSIS — I5022 Chronic systolic (congestive) heart failure: Secondary | ICD-10-CM | POA: Diagnosis not present

## 2016-09-05 DIAGNOSIS — I4891 Unspecified atrial fibrillation: Secondary | ICD-10-CM

## 2016-09-05 DIAGNOSIS — I10 Essential (primary) hypertension: Secondary | ICD-10-CM

## 2016-09-05 DIAGNOSIS — R7303 Prediabetes: Secondary | ICD-10-CM

## 2016-09-05 DIAGNOSIS — E784 Other hyperlipidemia: Secondary | ICD-10-CM | POA: Diagnosis not present

## 2016-09-05 DIAGNOSIS — Z0000 Encounter for general adult medical examination without abnormal findings: Secondary | ICD-10-CM | POA: Diagnosis not present

## 2016-09-05 LAB — CBC WITH DIFFERENTIAL/PLATELET
BASOS ABS: 0 {cells}/uL (ref 0–200)
Basophils Relative: 0 %
EOS PCT: 2 %
Eosinophils Absolute: 108 cells/uL (ref 15–500)
HCT: 41.8 % (ref 35.0–45.0)
HEMOGLOBIN: 14.5 g/dL (ref 11.7–15.5)
LYMPHS ABS: 1998 {cells}/uL (ref 850–3900)
Lymphocytes Relative: 37 %
MCH: 33 pg (ref 27.0–33.0)
MCHC: 34.7 g/dL (ref 32.0–36.0)
MCV: 95.2 fL (ref 80.0–100.0)
MPV: 10.3 fL (ref 7.5–12.5)
Monocytes Absolute: 540 cells/uL (ref 200–950)
Monocytes Relative: 10 %
NEUTROS ABS: 2754 {cells}/uL (ref 1500–7800)
Neutrophils Relative %: 51 %
Platelets: 251 10*3/uL (ref 140–400)
RBC: 4.39 MIL/uL (ref 3.80–5.10)
RDW: 13.4 % (ref 11.0–15.0)
WBC: 5.4 10*3/uL (ref 3.8–10.8)

## 2016-09-05 LAB — LIPID PANEL
CHOLESTEROL: 270 mg/dL — AB (ref <200)
HDL: 62 mg/dL (ref >50)
LDL Cholesterol: 180 mg/dL — ABNORMAL HIGH (ref <100)
Total CHOL/HDL Ratio: 4.4 Ratio (ref <5.0)
Triglycerides: 140 mg/dL (ref <150)
VLDL: 28 mg/dL (ref <30)

## 2016-09-05 LAB — IRON AND TIBC
%SAT: 38 % (ref 11–50)
Iron: 133 ug/dL (ref 45–160)
TIBC: 354 ug/dL (ref 250–450)
UIBC: 221 ug/dL (ref 125–400)

## 2016-09-05 LAB — BASIC METABOLIC PANEL WITH GFR
BUN: 18 mg/dL (ref 7–25)
CALCIUM: 9.9 mg/dL (ref 8.6–10.4)
CO2: 23 mmol/L (ref 20–31)
Chloride: 105 mmol/L (ref 98–110)
Creat: 1.13 mg/dL — ABNORMAL HIGH (ref 0.50–0.99)
GFR, EST NON AFRICAN AMERICAN: 53 mL/min — AB (ref >=60)
GFR, Est African American: 61 mL/min (ref >=60)
Glucose, Bld: 82 mg/dL (ref 65–99)
POTASSIUM: 4.3 mmol/L (ref 3.5–5.3)
SODIUM: 144 mmol/L (ref 135–146)

## 2016-09-05 LAB — HEPATIC FUNCTION PANEL
ALT: 20 U/L (ref 6–29)
AST: 26 U/L (ref 10–35)
Albumin: 4.5 g/dL (ref 3.6–5.1)
Alkaline Phosphatase: 106 U/L (ref 33–130)
Bilirubin, Direct: 0.1 mg/dL (ref <=0.2)
Indirect Bilirubin: 0.4 mg/dL (ref 0.2–1.2)
TOTAL PROTEIN: 7.4 g/dL (ref 6.1–8.1)
Total Bilirubin: 0.5 mg/dL (ref 0.2–1.2)

## 2016-09-05 LAB — VITAMIN B12: VITAMIN B 12: 411 pg/mL (ref 200–1100)

## 2016-09-05 LAB — TSH: TSH: 2.62 mIU/L

## 2016-09-06 LAB — URINALYSIS, ROUTINE W REFLEX MICROSCOPIC
BILIRUBIN URINE: NEGATIVE
Glucose, UA: NEGATIVE
Hgb urine dipstick: NEGATIVE
Ketones, ur: NEGATIVE
LEUKOCYTES UA: NEGATIVE
NITRITE: NEGATIVE
PROTEIN: NEGATIVE
Specific Gravity, Urine: 1.011 (ref 1.001–1.035)
pH: 5 (ref 5.0–8.0)

## 2016-09-06 LAB — INSULIN, RANDOM: Insulin: 11.2 u[IU]/mL (ref 2.0–19.6)

## 2016-09-06 LAB — MICROALBUMIN / CREATININE URINE RATIO
CREATININE, URINE: 60 mg/dL (ref 20–320)
Microalb, Ur: <0.2 mg/dL

## 2016-09-06 LAB — MAGNESIUM: MAGNESIUM: 2.1 mg/dL (ref 1.5–2.5)

## 2016-09-06 LAB — VITAMIN D 25 HYDROXY (VIT D DEFICIENCY, FRACTURES): VIT D 25 HYDROXY: 58 ng/mL (ref 30–100)

## 2016-09-06 LAB — HEMOGLOBIN A1C
HEMOGLOBIN A1C: 5.1 % (ref <5.7)
MEAN PLASMA GLUCOSE: 100 mg/dL

## 2016-09-07 ENCOUNTER — Other Ambulatory Visit: Payer: Self-pay | Admitting: *Deleted

## 2016-09-07 LAB — TB SKIN TEST
Induration: 0 mm
TB Skin Test: NEGATIVE

## 2016-09-07 MED ORDER — ROSUVASTATIN CALCIUM 40 MG PO TABS: 40.0000 mg | ORAL_TABLET | Freq: Every day | ORAL | 2 refills | Status: DC

## 2016-09-12 DIAGNOSIS — E784 Other hyperlipidemia: Secondary | ICD-10-CM | POA: Diagnosis not present

## 2016-09-12 DIAGNOSIS — I10 Essential (primary) hypertension: Secondary | ICD-10-CM | POA: Diagnosis not present

## 2016-09-12 DIAGNOSIS — I481 Persistent atrial fibrillation: Secondary | ICD-10-CM | POA: Diagnosis not present

## 2016-09-12 DIAGNOSIS — I5022 Chronic systolic (congestive) heart failure: Secondary | ICD-10-CM | POA: Diagnosis not present

## 2016-11-20 DIAGNOSIS — I5022 Chronic systolic (congestive) heart failure: Secondary | ICD-10-CM | POA: Diagnosis not present

## 2016-11-20 DIAGNOSIS — E784 Other hyperlipidemia: Secondary | ICD-10-CM | POA: Diagnosis not present

## 2016-11-20 DIAGNOSIS — I481 Persistent atrial fibrillation: Secondary | ICD-10-CM | POA: Diagnosis not present

## 2016-11-20 DIAGNOSIS — I10 Essential (primary) hypertension: Secondary | ICD-10-CM | POA: Diagnosis not present

## 2016-12-13 ENCOUNTER — Ambulatory Visit: Payer: Self-pay | Admitting: Internal Medicine

## 2016-12-17 DIAGNOSIS — I10 Essential (primary) hypertension: Secondary | ICD-10-CM | POA: Diagnosis not present

## 2016-12-17 DIAGNOSIS — I5022 Chronic systolic (congestive) heart failure: Secondary | ICD-10-CM | POA: Diagnosis not present

## 2016-12-17 DIAGNOSIS — E784 Other hyperlipidemia: Secondary | ICD-10-CM | POA: Diagnosis not present

## 2016-12-17 DIAGNOSIS — I481 Persistent atrial fibrillation: Secondary | ICD-10-CM | POA: Diagnosis not present

## 2017-01-09 NOTE — Progress Notes (Signed)
Assessment and Plan:  Hypertension:  -watch BP with cough tablets -monitor blood pressure at home.  -Continue DASH diet.   -Reminder to go to the ER if any CP, SOB, nausea, dizziness, severe HA, changes vision/speech, left arm numbness and tingling, and jaw pain.  Cholesterol: -Continue diet and exercise.    Pre-diabetes: -Continue diet and exercise.   Vitamin D Def: -continue medications.    Continue diet and meds as discussed. Further disposition pending results of labs. Future Appointments Date Time Provider Department Center  03/27/2017 4:30 PM Lucky Cowboy, MD [facility] None  10/01/2017 10:00 AM Lucky Cowboy, MD [facility] None    HPI 61 y.o. female  presents for 3 month follow up with hypertension, hyperlipidemia, prediabetes and vitamin D.   Her blood pressure has been controlled at home, today their BP is BP: 130/74.   She does workout. She denies chest pain, shortness of breath, dizziness.   She has history of Afib, on xarelto, had cardioversion in Jan, lopressor and diltiazem. EF was 40% via cath 03/2016. She follows with cardiology.    She is back on cholesterol medication, crestor and denies myalgias. Her cholesterol is at goal. The cholesterol last visit was:   Lab Results  Component Value Date   CHOL 270 (H) 09/05/2016   HDL 62 09/05/2016   LDLCALC 180 (H) 09/05/2016   TRIG 140 09/05/2016   CHOLHDL 4.4 09/05/2016    She has been working on diet and exercise for prediabetes, and denies foot ulcerations, hyperglycemia, hypoglycemia , increased appetite, nausea, paresthesia of the feet, polydipsia, polyuria, visual disturbances and vomiting. Last A1C in the office was:  Lab Results  Component Value Date   HGBA1C 5.1 09/05/2016   Patient is on Vitamin D supplement.  Lab Results  Component Value Date   VD25OH 58 09/05/2016   BMI is Body mass index is 28.79 kg/m., she is working on diet and exercise. Wt Readings from Last 3 Encounters:  01/10/17 178  lb 6.4 oz (80.9 kg)  09/05/16 169 lb 9.6 oz (76.9 kg)  07/13/16 170 lb (77.1 kg)       Current Medications:  Current Outpatient Prescriptions on File Prior to Visit  Medication Sig Dispense Refill  . Cholecalciferol (VITAMIN D PO) Take 5,000 Units by mouth 2 (two) times daily.    Marland Kitchen diltiazem (DILACOR XR) 240 MG 24 hr capsule Take 240 mg by mouth daily.    Marland Kitchen loratadine (CLARITIN) 10 MG tablet Take 10 mg by mouth daily.    . metoprolol (LOPRESSOR) 50 MG tablet Take 50 mg by mouth 2 (two) times daily.    . ramipril (ALTACE) 1.25 MG capsule Take 1 capsule (1.25 mg total) by mouth daily. 30 capsule 3  . rivaroxaban (XARELTO) 20 MG TABS tablet Take 1 tablet (20 mg total) by mouth daily. 30 tablet 3  . rosuvastatin (CRESTOR) 40 MG tablet Take 1 tablet (40 mg total) by mouth daily. 30 tablet 2   No current facility-administered medications on file prior to visit.     Medical History:  Past Medical History:  Diagnosis Date  . Atrial fibrillation (HCC) 03/22/2016   NEW ONSET   . Hyperlipidemia   . Hypertension   . Pre-diabetes   . Vitamin D deficiency     Allergies:  Allergies  Allergen Reactions  . Codeine     REACTION: TREMORS/NERVOUS  . Penicillins     REACTION: RASH/SWELLING     Review of Systems:  Review of Systems  Constitutional: Negative  for chills, fever and malaise/fatigue.  HENT: Negative for congestion, ear pain and sore throat.   Eyes: Negative.   Respiratory: Negative for cough, shortness of breath and wheezing.   Cardiovascular: Negative for chest pain, palpitations and leg swelling.  Gastrointestinal: Negative for abdominal pain, blood in stool, constipation, diarrhea, heartburn and melena.  Genitourinary: Negative.   Skin: Negative.   Neurological: Negative for dizziness, sensory change, loss of consciousness and headaches.  Psychiatric/Behavioral: Negative for depression. The patient is not nervous/anxious and does not have insomnia.     Family history-  Review and unchanged  Social history- Review and unchanged  Physical Exam: BP 130/74   Pulse (!) 58   Temp 97.7 F (36.5 C)   Resp 14   Ht 5\' 6"  (1.676 m)   Wt 178 lb 6.4 oz (80.9 kg)   SpO2 97%   BMI 28.79 kg/m  Wt Readings from Last 3 Encounters:  01/10/17 178 lb 6.4 oz (80.9 kg)  09/05/16 169 lb 9.6 oz (76.9 kg)  07/13/16 170 lb (77.1 kg)    General Appearance: Well nourished well developed, in no apparent distress. Eyes: PERRLA, EOMs, conjunctiva no swelling or erythema ENT/Mouth: Ear canals normal without obstruction, swelling, erythma, discharge.  TMs normal bilaterally.  Oropharynx moist, clear, without exudate, or postoropharyngeal swelling. Neck: Supple, thyroid normal,no cervical adenopathy  Respiratory: Respiratory effort normal, Breath sounds clear A&P without rhonchi, wheeze, or rale.  No retractions, no accessory usage. Cardio: RRR with no MRGs. Brisk peripheral pulses without edema.  Abdomen: Soft, + BS,  Non tender, no guarding, rebound, hernias, masses. Musculoskeletal: Full ROM, 5/5 strength, Normal gait Skin: Warm, dry without rashes, lesions, ecchymosis.  Neuro: Awake and oriented X 3, Cranial nerves intact. Normal muscle tone, no cerebellar symptoms. Psych: Normal affect, Insight and Judgment appropriate.    Quentin Mulling, PA-C 4:36 PM [facility]

## 2017-01-10 ENCOUNTER — Ambulatory Visit (INDEPENDENT_AMBULATORY_CARE_PROVIDER_SITE_OTHER): Payer: BLUE CROSS/BLUE SHIELD | Admitting: Physician Assistant

## 2017-01-10 ENCOUNTER — Encounter: Payer: Self-pay | Admitting: Physician Assistant

## 2017-01-10 VITALS — BP 130/74 | HR 58 | Temp 97.7°F | Resp 14 | Ht 66.0 in | Wt 178.4 lb

## 2017-01-10 DIAGNOSIS — Z79899 Other long term (current) drug therapy: Secondary | ICD-10-CM

## 2017-01-10 DIAGNOSIS — I4891 Unspecified atrial fibrillation: Secondary | ICD-10-CM

## 2017-01-10 DIAGNOSIS — I10 Essential (primary) hypertension: Secondary | ICD-10-CM

## 2017-01-10 DIAGNOSIS — E782 Mixed hyperlipidemia: Secondary | ICD-10-CM

## 2017-01-10 LAB — LIPID PANEL
CHOL/HDL RATIO: 3 ratio (ref <5.0)
CHOLESTEROL: 169 mg/dL (ref <200)
HDL: 56 mg/dL (ref >50)
LDL Cholesterol: 88 mg/dL (ref <100)
TRIGLYCERIDES: 127 mg/dL (ref <150)
VLDL: 25 mg/dL (ref <30)

## 2017-01-10 LAB — BASIC METABOLIC PANEL WITH GFR
BUN: 15 mg/dL (ref 7–25)
CO2: 26 mmol/L (ref 20–31)
Calcium: 9.7 mg/dL (ref 8.6–10.4)
Chloride: 100 mmol/L (ref 98–110)
Creat: 1.05 mg/dL — ABNORMAL HIGH (ref 0.50–0.99)
GFR, Est African American: 66 mL/min (ref >=60)
GFR, Est Non African American: 57 mL/min — ABNORMAL LOW (ref >=60)
GLUCOSE: 84 mg/dL (ref 65–99)
POTASSIUM: 4.9 mmol/L (ref 3.5–5.3)
SODIUM: 138 mmol/L (ref 135–146)

## 2017-01-10 LAB — HEPATIC FUNCTION PANEL
ALT: 30 U/L — AB (ref 6–29)
AST: 30 U/L (ref 10–35)
Albumin: 4.6 g/dL (ref 3.6–5.1)
Alkaline Phosphatase: 109 U/L (ref 33–130)
BILIRUBIN DIRECT: 0.1 mg/dL (ref <=0.2)
BILIRUBIN INDIRECT: 0.3 mg/dL (ref 0.2–1.2)
Total Bilirubin: 0.4 mg/dL (ref 0.2–1.2)
Total Protein: 7.2 g/dL (ref 6.1–8.1)

## 2017-01-10 LAB — CBC WITH DIFFERENTIAL/PLATELET
BASOS PCT: 0 %
Basophils Absolute: 0 cells/uL (ref 0–200)
Eosinophils Absolute: 162 cells/uL (ref 15–500)
Eosinophils Relative: 2 %
HEMATOCRIT: 39.9 % (ref 35.0–45.0)
HEMOGLOBIN: 13.4 g/dL (ref 11.7–15.5)
LYMPHS ABS: 3159 {cells}/uL (ref 850–3900)
Lymphocytes Relative: 39 %
MCH: 32.4 pg (ref 27.0–33.0)
MCHC: 33.6 g/dL (ref 32.0–36.0)
MCV: 96.6 fL (ref 80.0–100.0)
MONO ABS: 891 {cells}/uL (ref 200–950)
MPV: 10.5 fL (ref 7.5–12.5)
Monocytes Relative: 11 %
NEUTROS ABS: 3888 {cells}/uL (ref 1500–7800)
Neutrophils Relative %: 48 %
Platelets: 239 10*3/uL (ref 140–400)
RBC: 4.13 MIL/uL (ref 3.80–5.10)
RDW: 13.1 % (ref 11.0–15.0)
WBC: 8.1 10*3/uL (ref 3.8–10.8)

## 2017-01-10 LAB — TSH: TSH: 2.51 m[IU]/L

## 2017-01-10 NOTE — Patient Instructions (Signed)

## 2017-01-11 NOTE — Progress Notes (Signed)
LVM for pt to return office call for LAB results.

## 2017-01-14 NOTE — Progress Notes (Signed)
LVM for pt to return office call for LAB results.

## 2017-01-15 NOTE — Progress Notes (Signed)
Pt aware of lab results & voiced understanding of those results.

## 2017-01-17 DIAGNOSIS — I5022 Chronic systolic (congestive) heart failure: Secondary | ICD-10-CM | POA: Diagnosis not present

## 2017-01-17 DIAGNOSIS — I481 Persistent atrial fibrillation: Secondary | ICD-10-CM | POA: Diagnosis not present

## 2017-01-17 DIAGNOSIS — I10 Essential (primary) hypertension: Secondary | ICD-10-CM | POA: Diagnosis not present

## 2017-01-31 ENCOUNTER — Other Ambulatory Visit: Payer: Self-pay

## 2017-01-31 MED ORDER — ROSUVASTATIN CALCIUM 40 MG PO TABS: 40.0000 mg | ORAL_TABLET | Freq: Every day | ORAL | 2 refills | Status: DC

## 2017-03-27 ENCOUNTER — Encounter: Payer: Self-pay | Admitting: Internal Medicine

## 2017-03-27 ENCOUNTER — Ambulatory Visit (INDEPENDENT_AMBULATORY_CARE_PROVIDER_SITE_OTHER): Payer: BLUE CROSS/BLUE SHIELD | Admitting: Internal Medicine

## 2017-03-27 VITALS — BP 126/84 | HR 68 | Temp 97.4°F | Resp 16 | Ht 66.0 in | Wt 177.6 lb

## 2017-03-27 DIAGNOSIS — I10 Essential (primary) hypertension: Secondary | ICD-10-CM

## 2017-03-27 DIAGNOSIS — I480 Paroxysmal atrial fibrillation: Secondary | ICD-10-CM

## 2017-03-27 DIAGNOSIS — Z79899 Other long term (current) drug therapy: Secondary | ICD-10-CM | POA: Diagnosis not present

## 2017-03-27 DIAGNOSIS — R7303 Prediabetes: Secondary | ICD-10-CM

## 2017-03-27 DIAGNOSIS — E782 Mixed hyperlipidemia: Secondary | ICD-10-CM

## 2017-03-27 DIAGNOSIS — E559 Vitamin D deficiency, unspecified: Secondary | ICD-10-CM | POA: Diagnosis not present

## 2017-03-27 NOTE — Patient Instructions (Signed)

## 2017-03-27 NOTE — Progress Notes (Signed)
This very nice 61 y.o. MWF presents for 3 month follow up with Hypertension, Hyperlipidemia, Pre-Diabetes and Vitamin D Deficiency.      Patient has labile HTN & BP has been controlled at home. Today's BP is at goal -  126/84.      Patient was hospitalized in Feb, 2017 w/rapid Afib and had a negative Heart cath. Patient was started on Xarelto and had a negative CV by Dr Orpah Cobb.  Patient has had no complaints of any cardiac type chest pain, palpitations, dyspnea/orthopnea/PND, dizziness, claudication, or dependent edema.     Hyperlipidemia is controlled with diet & meds. Patient denies myalgias or other med SE's. Last Lipids were at goal: Lab Results  Component Value Date   CHOL 169 01/10/2017   HDL 56 01/10/2017   LDLCALC 88 01/10/2017   TRIG 127 01/10/2017   CHOLHDL 3.0 01/10/2017      Also, the patient is monitored expectantly for  PreDiabetes and has had no symptoms of reactive hypoglycemia, diabetic polys, paresthesias or visual blurring.  Last A1c was at goal: Lab Results  Component Value Date   HGBA1C 5.1 09/05/2016      Further, the patient also has history of Vitamin D Deficiency  ("10" / 2008) and supplements vitamin D without any suspected side-effects. Last vitamin D was near goial (70-100): Lab Results  Component Value Date   VD25OH 58 09/05/2016   Current Outpatient Prescriptions on File Prior to Visit  Medication Sig  . Cholecalciferol (VITAMIN D PO) Take 5,000 Units by mouth 2 (two) times daily.  Marland Kitchen diltiazem (DILACOR XR) 240 MG 24 hr capsule Take 240 mg by mouth daily.  Marland Kitchen loratadine (CLARITIN) 10 MG tablet Take 10 mg by mouth daily.  . metoprolol (LOPRESSOR) 50 MG tablet Take 50 mg by mouth 2 (two) times daily.  . ramipril (ALTACE) 1.25 MG capsule Take 1 capsule (1.25 mg total) by mouth daily.  . rivaroxaban (XARELTO) 20 MG TABS tablet Take 1 tablet (20 mg total) by mouth daily.  . rosuvastatin (CRESTOR) 40 MG tablet Take 1 tablet (40 mg total) by mouth  daily.   No current facility-administered medications on file prior to visit.    Allergies  Allergen Reactions  . Codeine     REACTION: TREMORS/NERVOUS  . Penicillins     REACTION: RASH/SWELLING   PMHx:   Past Medical History:  Diagnosis Date  . Atrial fibrillation (HCC) 03/22/2016   NEW ONSET   . Hyperlipidemia   . Hypertension   . Pre-diabetes   . Vitamin D deficiency    Immunization History  Administered Date(s) Administered  . PPD Test 08/05/2015, 09/05/2016  . Tdap 09/19/2010   Past Surgical History:  Procedure Laterality Date  . CARDIAC CATHETERIZATION N/A 03/23/2016   Procedure: Right/Left Heart Cath and Coronary Angiography;  Surgeon: Orpah Cobb, MD;  Location: [facility];  Service: Cardiovascular;  Laterality: N/A;  . CARDIOVERSION N/A 07/13/2016   Procedure: CARDIOVERSION;  Surgeon: Orpah Cobb, MD;  Location: [facility];  Service: Cardiovascular;  Laterality: N/A;  . CYST FROM WRIST Right   . TUBAL LIGATION     FHx:    Reviewed / unchanged  SHx:    Reviewed / unchanged  Systems Review:  Constitutional: Denies fever, chills, wt changes, headaches, insomnia, fatigue, night sweats, change in appetite. Eyes: Denies redness, blurred vision, diplopia, discharge, itchy, watery eyes.  ENT: Denies discharge, congestion, post nasal drip, epistaxis, sore throat, earache, hearing loss, dental pain,  tinnitus, vertigo, sinus pain, snoring.  CV: Denies chest pain, palpitations, irregular heartbeat, syncope, dyspnea, diaphoresis, orthopnea, PND, claudication or edema. Respiratory: denies cough, dyspnea, DOE, pleurisy, hoarseness, laryngitis, wheezing.  Gastrointestinal: Denies dysphagia, odynophagia, heartburn, reflux, water brash, abdominal pain or cramps, nausea, vomiting, bloating, diarrhea, constipation, hematemesis, melena, hematochezia  or hemorrhoids. Genitourinary: Denies dysuria, frequency, urgency, nocturia, hesitancy, discharge, hematuria or flank  pain. Musculoskeletal: Denies arthralgias, myalgias, stiffness, jt. swelling, pain, limping or strain/sprain.  Skin: Denies pruritus, rash, hives, warts, acne, eczema or change in skin lesion(s). Neuro: No weakness, tremor, incoordination, spasms, paresthesia or pain. Psychiatric: Denies confusion, memory loss or sensory loss. Endo: Denies change in weight, skin or hair change.  Heme/Lymph: No excessive bleeding, bruising or enlarged lymph nodes.  Physical Exam  BP 126/84   Pulse 68   Temp (!) 97.4 F (36.3 C)   Resp 16   Ht 5\' 6"  (1.676 m)   Wt 177 lb 9.6 oz (80.6 kg)   BMI 28.67 kg/m   Appears well nourished, well groomed  and in no distress.  Eyes: PERRLA, EOMs, conjunctiva no swelling or erythema. Sinuses: No frontal/maxillary tenderness ENT/Mouth: EAC's clear, TM's nl w/o erythema, bulging. Nares clear w/o erythema, swelling, exudates. Oropharynx clear without erythema or exudates. Oral hygiene is good. Tongue normal, non obstructing. Hearing intact.  Neck: Supple. Thyroid nl. Car 2+/2+ without bruits, nodes or JVD. Chest: Respirations nl with BS clear & equal w/o rales, rhonchi, wheezing or stridor.  Cor: Heart sounds normal w/ regular rate and rhythm without sig. murmurs, gallops, clicks or rubs. Peripheral pulses normal and equal  without edema.  Abdomen: Soft & bowel sounds normal. Non-tender w/o guarding, rebound, hernias, masses or organomegaly.  Lymphatics: Unremarkable.  Musculoskeletal: Full ROM all peripheral extremities, joint stability, 5/5 strength and normal gait.  Skin: Warm, dry without exposed rashes, lesions or ecchymosis apparent.  Neuro: Cranial nerves intact, reflexes equal bilaterally. Sensory-motor testing grossly intact. Tendon reflexes grossly intact.  Pysch: Alert & oriented x 3.  Insight and judgement nl & appropriate. No ideations.  Assessment and Plan:  1. Essential hypertension  - Continue medication, monitor blood pressure at home.  -  Continue DASH diet. Reminder to go to the ER if any CP,  SOB, nausea, dizziness, severe HA, changes vision/speech.  - CBC with Differential/Platelet - BASIC METABOLIC PANEL WITH GFR - Magnesium - TSH  2. Hyperlipidemia, mixed  - Continue diet/meds, exercise,& lifestyle modifications.  - Continue monitor periodic cholesterol/liver & renal functions   - Hepatic function panel - Lipid panel - TSH  3. Prediabetes  - Continue diet, exercise, lifestyle modifications.  - Monitor appropriate labs.  - Hemoglobin A1c - Insulin, random  4. Vitamin D deficiency  - Continue supplementation.  - VITAMIN D 25 Hydroxy   5. Paroxysmal atrial fibrillation (HCC)  - TSH  6. Medication management - CBC with Differential/Platelet - BASIC METABOLIC PANEL WITH GFR - Hepatic function panel - Magnesium - Lipid panel - TSH - Hemoglobin A1c - Insulin, random - VITAMIN D 25 Hydroxy        Discussed  regular exercise, BP monitoring, weight control to achieve/maintain BMI less than 25 and discussed med and SE's. Recommended labs to assess and monitor clinical status with further disposition pending results of labs. Over 30 minutes of exam, counseling, chart review was performed.

## 2017-03-28 LAB — CBC WITH DIFFERENTIAL/PLATELET
BASOS PCT: 0.3 %
Basophils Absolute: 22 cells/uL (ref 0–200)
EOS ABS: 94 {cells}/uL (ref 15–500)
Eosinophils Relative: 1.3 %
HEMATOCRIT: 39 % (ref 35.0–45.0)
HEMOGLOBIN: 13.6 g/dL (ref 11.7–15.5)
LYMPHS ABS: 3226 {cells}/uL (ref 850–3900)
MCH: 32.6 pg (ref 27.0–33.0)
MCHC: 34.9 g/dL (ref 32.0–36.0)
MCV: 93.5 fL (ref 80.0–100.0)
MPV: 10.8 fL (ref 7.5–12.5)
Monocytes Relative: 8.8 %
Neutro Abs: 3226 cells/uL (ref 1500–7800)
Neutrophils Relative %: 44.8 %
Platelets: 226 10*3/uL (ref 140–400)
RBC: 4.17 10*6/uL (ref 3.80–5.10)
RDW: 12.1 % (ref 11.0–15.0)
TOTAL LYMPHOCYTE: 44.8 %
WBC: 7.2 10*3/uL (ref 3.8–10.8)
WBCMIX: 634 {cells}/uL (ref 200–950)

## 2017-03-28 LAB — BASIC METABOLIC PANEL WITH GFR
BUN / CREAT RATIO: 13 (calc) (ref 6–22)
BUN: 13 mg/dL (ref 7–25)
CHLORIDE: 103 mmol/L (ref 98–110)
CO2: 27 mmol/L (ref 20–32)
Calcium: 9.7 mg/dL (ref 8.6–10.4)
Creat: 1.02 mg/dL — ABNORMAL HIGH (ref 0.50–0.99)
GFR, Est African American: 69 mL/min/{1.73_m2} (ref >=60)
GFR, Est Non African American: 59 mL/min/{1.73_m2} — ABNORMAL LOW (ref >=60)
Glucose, Bld: 86 mg/dL (ref 65–99)
Potassium: 4.3 mmol/L (ref 3.5–5.3)
Sodium: 139 mmol/L (ref 135–146)

## 2017-03-28 LAB — LIPID PANEL
CHOL/HDL RATIO: 2.4 (calc) (ref <5.0)
CHOLESTEROL: 170 mg/dL (ref <200)
HDL: 70 mg/dL (ref >50)
LDL Cholesterol (Calc): 81 mg/dL (calc)
Non-HDL Cholesterol (Calc): 100 mg/dL (calc) (ref <130)
Triglycerides: 95 mg/dL (ref <150)

## 2017-03-28 LAB — HEPATIC FUNCTION PANEL
AG RATIO: 1.6 (calc) (ref 1.0–2.5)
ALKALINE PHOSPHATASE (APISO): 99 U/L (ref 33–130)
ALT: 17 U/L (ref 6–29)
AST: 24 U/L (ref 10–35)
Albumin: 4.5 g/dL (ref 3.6–5.1)
BILIRUBIN INDIRECT: 0.3 mg/dL (ref 0.2–1.2)
Bilirubin, Direct: 0.1 mg/dL (ref 0.0–0.2)
GLOBULIN: 2.9 g/dL (ref 1.9–3.7)
TOTAL PROTEIN: 7.4 g/dL (ref 6.1–8.1)
Total Bilirubin: 0.4 mg/dL (ref 0.2–1.2)

## 2017-03-28 LAB — TSH: TSH: 1.57 mIU/L (ref 0.40–4.50)

## 2017-03-28 LAB — HEMOGLOBIN A1C
EAG (MMOL/L): 6.2 (calc)
HEMOGLOBIN A1C: 5.5 %{Hb} (ref <5.7)
MEAN PLASMA GLUCOSE: 111 (calc)

## 2017-03-28 LAB — MAGNESIUM: MAGNESIUM: 2 mg/dL (ref 1.5–2.5)

## 2017-03-28 LAB — INSULIN, RANDOM: Insulin: 4.6 u[IU]/mL (ref 2.0–19.6)

## 2017-03-28 LAB — VITAMIN D 25 HYDROXY (VIT D DEFICIENCY, FRACTURES): VIT D 25 HYDROXY: 61 ng/mL (ref 30–100)

## 2017-06-05 DIAGNOSIS — I481 Persistent atrial fibrillation: Secondary | ICD-10-CM | POA: Diagnosis not present

## 2017-06-05 DIAGNOSIS — I10 Essential (primary) hypertension: Secondary | ICD-10-CM | POA: Diagnosis not present

## 2017-06-05 DIAGNOSIS — I5022 Chronic systolic (congestive) heart failure: Secondary | ICD-10-CM | POA: Diagnosis not present

## 2017-07-01 ENCOUNTER — Ambulatory Visit: Payer: Self-pay | Admitting: Adult Health

## 2017-07-10 DIAGNOSIS — E663 Overweight: Secondary | ICD-10-CM | POA: Insufficient documentation

## 2017-07-10 NOTE — Progress Notes (Signed)
FOLLOW UP  Assessment and Plan:   Hypertension Well controlled with current medications  Monitor blood pressure at home; patient to call if consistently greater than 130/80 Continue DASH diet.   Reminder to go to the ER if any CP, SOB, nausea, dizziness, severe HA, changes vision/speech, left arm numbness and tingling and jaw pain.  Cholesterol Currently at goal; continue with statin  Continue low cholesterol diet and exercise.   Check lipid panel.   Other abnormal glucose Continue diet and exercise.  Perform daily foot/skin check, notify office of any concerning changes.  Check A1C  Overweight Long discussion about weight loss, diet, and exercise Recommended diet heavy in fruits and veggies and low in animal meats, cheeses, and dairy products, appropriate calorie intake Discussed ideal weight for height  and initial weight goal (175 lb) Patient will work on -sticking to new low carb diet, exercising, start walking when it get's warm Will follow up in 3 months  Vitamin D Def Near goal at last visit; continue supplementation to maintain goal of 70-100 Defer Vit D level  A fib with RVR No concerns with excessive bleeding, no falls Continue medication: xarelto       Continue follow up with cardiology  Continue diet and meds as discussed. Further disposition pending results of labs. Discussed med's effects and SE's.   Over 30 minutes of exam, counseling, chart review, and critical decision making was performed.   Future Appointments  Date Time Provider Department Center  10/01/2017 10:00 AM Lucky Cowboy, MD [facility] None    ----------------------------------------------------------------------------------------------------------------------  HPI 62 y.o. female  presents for 3 month follow up on hypertension, cholesterol, hx of abnormal glucose, weight and vitamin D deficiency. She has history of Afib, on xarelto, had cardioversion in Jan, lopressor and diltiazem. EF  was 40% via cath 03/2016. She follows with cardiology.  BMI is Body mass index is 29.21 kg/m., she has been working on diet and exercise - following a low carb diet. She is trying to get up hourly and drink water. Would like to get down to 165 lb this year.  Wt Readings from Last 3 Encounters:  07/11/17 181 lb (82.1 kg)  03/27/17 177 lb 9.6 oz (80.6 kg)  01/10/17 178 lb 6.4 oz (80.9 kg)   Her blood pressure has been controlled at home, today their BP is BP: 128/82  She does workout. She denies chest pain, shortness of breath, dizziness.   She is on cholesterol medication and denies myalgias. Her cholesterol is at goal. The cholesterol last visit was:   Lab Results  Component Value Date   CHOL 170 03/27/2017   HDL 70 03/27/2017   LDLCALC 88 01/10/2017   TRIG 95 03/27/2017   CHOLHDL 2.4 03/27/2017    She has been working on diet and exercise for glucose management, and denies increased appetite, nausea, paresthesia of the feet, polydipsia, polyuria, visual disturbances and vomiting. Last A1C in the office was:  Lab Results  Component Value Date   HGBA1C 5.5 03/27/2017   Patient is on Vitamin D supplement and near goal of 70 at recent check:   Lab Results  Component Value Date   VD25OH 61 03/27/2017        Current Medications:  Current Outpatient Medications on File Prior to Visit  Medication Sig  . Cholecalciferol (VITAMIN D PO) Take 5,000 Units by mouth 2 (two) times daily.  Marland Kitchen diltiazem (DILACOR XR) 240 MG 24 hr capsule Take 240 mg by mouth daily.  . metoprolol (  LOPRESSOR) 50 MG tablet Take 50 mg by mouth 2 (two) times daily.  . ramipril (ALTACE) 1.25 MG capsule Take 1 capsule (1.25 mg total) by mouth daily.  . rivaroxaban (XARELTO) 20 MG TABS tablet Take 1 tablet (20 mg total) by mouth daily.  . rosuvastatin (CRESTOR) 40 MG tablet Take 1 tablet (40 mg total) by mouth daily.  Marland Kitchen loratadine (CLARITIN) 10 MG tablet Take 10 mg by mouth daily.   No current facility-administered  medications on file prior to visit.      Allergies:  Allergies  Allergen Reactions  . Codeine     REACTION: TREMORS/NERVOUS  . Penicillins     REACTION: RASH/SWELLING     Medical History:  Past Medical History:  Diagnosis Date  . Atrial fibrillation (HCC) 03/22/2016   NEW ONSET   . Hyperlipidemia   . Hypertension   . Pre-diabetes   . Vitamin D deficiency    Family history- Reviewed and unchanged Social history- Reviewed and unchanged   Review of Systems:  Review of Systems  Constitutional: Negative for malaise/fatigue and weight loss.  HENT: Negative for hearing loss and tinnitus.   Eyes: Negative for blurred vision and double vision.  Respiratory: Negative for cough, shortness of breath and wheezing.   Cardiovascular: Negative for chest pain, palpitations, orthopnea, claudication and leg swelling.  Gastrointestinal: Negative for abdominal pain, blood in stool, constipation, diarrhea, heartburn, melena, nausea and vomiting.  Genitourinary: Negative.   Musculoskeletal: Negative for joint pain and myalgias.  Skin: Negative for rash.  Neurological: Negative for dizziness, tingling, sensory change, weakness and headaches.  Endo/Heme/Allergies: Negative for polydipsia.  Psychiatric/Behavioral: Negative.   All other systems reviewed and are negative.   Physical Exam: BP 128/82   Pulse 83   Temp 97.7 F (36.5 C)   Ht 5\' 6"  (1.676 m)   Wt 181 lb (82.1 kg)   SpO2 98%   BMI 29.21 kg/m  Wt Readings from Last 3 Encounters:  07/11/17 181 lb (82.1 kg)  03/27/17 177 lb 9.6 oz (80.6 kg)  01/10/17 178 lb 6.4 oz (80.9 kg)   General Appearance: Well nourished, in no apparent distress. Eyes: PERRLA, EOMs, conjunctiva no swelling or erythema Sinuses: No Frontal/maxillary tenderness ENT/Mouth: Ext aud canals clear, TMs without erythema, bulging. No erythema, swelling, or exudate on post pharynx.  Tonsils not swollen or erythematous. Hearing normal.  Neck: Supple, thyroid  normal.  Respiratory: Respiratory effort normal, BS equal bilaterally without rales, rhonchi, wheezing or stridor.  Cardio: Heart sounds irregularly irregular without audible murmur. Brisk peripheral pulses without edema.  Abdomen: Soft, + BS.  Non tender, no guarding, rebound, hernias, masses. Lymphatics: Non tender without lymphadenopathy.  Musculoskeletal: Full ROM, 5/5 strength, Normal gait Skin: Warm, dry without rashes, lesions, ecchymosis.  Neuro: Cranial nerves intact. No cerebellar symptoms.  Psych: Awake and oriented X 3, normal affect, Insight and Judgment appropriate.    Dan Maker, NP 4:54 PM [facility]

## 2017-07-11 ENCOUNTER — Encounter: Payer: Self-pay | Admitting: Adult Health

## 2017-07-11 ENCOUNTER — Ambulatory Visit: Payer: BLUE CROSS/BLUE SHIELD | Admitting: Adult Health

## 2017-07-11 VITALS — BP 128/82 | HR 83 | Temp 97.7°F | Ht 66.0 in | Wt 181.0 lb

## 2017-07-11 DIAGNOSIS — Z79899 Other long term (current) drug therapy: Secondary | ICD-10-CM | POA: Diagnosis not present

## 2017-07-11 DIAGNOSIS — E782 Mixed hyperlipidemia: Secondary | ICD-10-CM

## 2017-07-11 DIAGNOSIS — R7309 Other abnormal glucose: Secondary | ICD-10-CM

## 2017-07-11 DIAGNOSIS — I4891 Unspecified atrial fibrillation: Secondary | ICD-10-CM

## 2017-07-11 DIAGNOSIS — E663 Overweight: Secondary | ICD-10-CM | POA: Diagnosis not present

## 2017-07-11 DIAGNOSIS — I10 Essential (primary) hypertension: Secondary | ICD-10-CM

## 2017-07-11 DIAGNOSIS — E559 Vitamin D deficiency, unspecified: Secondary | ICD-10-CM | POA: Diagnosis not present

## 2017-07-11 MED ORDER — ROSUVASTATIN CALCIUM 40 MG PO TABS
40.0000 mg | ORAL_TABLET | ORAL | 2 refills | Status: DC
Start: 2017-07-11 — End: 2017-09-30

## 2017-07-11 NOTE — Patient Instructions (Signed)
SMART goals Having a solid fitness goal is an amazing way to power you towards success, but not all goals are created equal. While it's great to have an end-game in mind, there are some best practices when it comes to goal setting. Whether you want to lose weight, improve your fitness level, or train for an event, putting the SMART method into action can help you achieve what you set out to do.  SMART stands for specific, measurable, attainable, relevant, and timely-all of which are important in reaching a fitness objective. SMART goals can help keep you on track and remind you of your priorities, so you're able to follow through with every workout or healthy meal you have planned.   Get SMART and put these five elements into action when you're setting your fitness goal.  1. Specific  You need something that's not too arbitrary. For example, a bad goal would be, say, 'get healthy'. A specific goal would be to lose weight. You'll narrow down that goal even further by using the rest of the method, but whether you want to get stronger, faster, or smaller, having a baseline points you in the right direction.  2. Measurable  Here's where you determine exactly how you'll measure your goal. If you're going to follow the bad goal, it would be get really healthy.That's not quantifiable. A measurable goal would be, say, 'lose 10 pounds'. You can quantify your progress, and you can sort of back into a time frame once you have that. Your goal may be to master a pull-up, run five miles, or go to the gym four days a week-whatever it is, you should have a definite way of knowing when you've reached your goal.  3. Attainable  While it can be helpful to set big-picture goals in the long-term, you need a more achievable goal on the horizon to keep you on track. You want to start small and see early wins, which encourages long-term consistency.  If you set something too lofty right off the bat, it might be  discouraging to not make progress as fast as you would like. You should also consider the size of your goal-for example, a goal of losing 30 pounds in one month just isn't going to happen, so you're better off setting smaller goals that are in closer reach.  4. Relevant  This is where things get a little tricky, finding your "why" is easier said than done. Ask yourself, 'is this goal worthwhile, and am I motivated to do it?' Creating a goal with some type of motivation attached to it, like I want to lose 10 pounds in two months to be ready for my wedding, can give a bit of relevancy to your goal. Whether you want to feel confident at a big event or perform better during everyday activities, pinpoint why a goal is important to you.  5. Timely  You want to be strict about a deadline-doing so creates urgency. It's also important not to set your sights too far out. If you give yourself four months to lose 10 pounds, that might be too long because you aren't incentivized to start working at it immediately. Instead, consider setting smaller goals along the way, like "I want to lose three pounds in two weeks." Maybe running a marathon is your long-term goal, but if you've never been a runner, signing up for one that's a month away isn't realistic-instead, set smaller mileage goals for shorter time periods and work your way up.  You should also be honest with yourself about what you're able to accomplish in a given time frame. You just need to adjust your expectations so they're in line with your schedule and commitments.  Once you have your goal in place, it's all about the follow-through. Whether you want to lose one pound a week, be able to do five full push-ups in two weeks, or run a 5K in under 30 minutes in four weeks, you can come up with a plan to help get your where you want to go-but it all starts with deciding what you want. Be accountable to yourself, stay consistent, and the results will  follow.  So try the exercise at home and at your next visit come in with your SMART goal and we can discuss how together we can achieve this goal!  8 Critical Weight-Loss Tips That Aren't Diet and Exercise  1. STARVE THE DISTRACTIONS  All too often when we eat, we're also multitasking: watching TV, answering emails, scrolling through social media. These habits are detrimental to having a strong, clear, healthy relationship with food, and they can hinder our ability to make dietary changes.  In order to truly focus on what you're eating, how much you're eating, why you're eating those specific foods and, most importantly, how those foods make you feel, you need to starve the distractions. That means when you eat, just eat. Focus on your food, the process it went through to end up on your plate, where it came from and how it nourishes you. With this technique, you're more likely to finish a meal feeling satiated.  2.  CONSIDER WHAT YOU'RE NOT WILLING TO DO  This might sound counterintuitive, but it can help provide a "why" when motivation is waning. Declare, in writing, what you are unwilling to do, for example "I am unwilling to be the old dad who cannot play sports with my children".  So consider what you're not willing to accept, write it down, and keep it at the ready.  3.  STOP LABELING FOOD "GOOD" AND "BAD"  You've probably heard someone say they ate something "bad." Maybe you've even said it yourself.  The trouble with 'bad' foods isn't that they'll send you to the grave after a bite or two. The trouble comes when we eat excessive portions of really calorie-dense foods meal after meal, day after day.  Instead of labeling foods as good or bad, think about which foods you can eat a lot of, and which ones you should just eat a little of. Then, plan ways to eat the foods you really like in portions that fit with your overall goals. A good example of this would be having a slice of pizza  alongside a club salad with chicken breast, avocado and a bit of dressing. This is vastly different than 3 slices of pizza, 4 breadsticks with cheese sauce and half of a liter of regular soda.  4.  BRUSH YOUR TEETH AFTER YOU EAT  Getting your mindset in order is important, but sometimes small habits can make a big difference. After eating, you still have the taste of food in their mouth, which often causes people to eat more even if they are full or engage in a nibble or two of dessert.  Brushing your teeth will remove the taste of food from your mouth, and the clean, minty freshness will serve as a cue that mealtime is over.  5.  FOCUS ON CROWDING NOT CUTTING  The most  common first step during 'dieting' is to cut. We cut our portion sizes down, we cut out 'bad' foods, we cut out entire food groups. This act of cutting puts [place] and our minds into scarcity mode.  When something is off-limits, even if you're able to avoid it for a while, you could end up bingeing on it later because you've gone so long without it. So, instead of cutting, focus on crowding. If you crowd your plate and fill it up with more foods like veggies and protein, it simply allows less room for the other stuff. In other words, shift your focus away from what you can't eat, and celebrate the foods that will help you reach your goals.  6.  TAKE TRACKING A STEP FURTHER  Track what you eat, when you ate it, how much you ate and how that food made you feel. Being completely honest with yourself and writing down every single thing that passes through your lips will help you start to notice that maybe you actually do snack, possibly take in more sugar than you thought, eat when you're bored rather than just hungry or maybe that you have a habit of snacking before bed while watching TV.  The difference from simply tracking your food intake is you're taking into account how food makes you feel, as well as what you're doing while you're  eating. This is about becoming more mindful of what, when and why you eat.  7.  PRIORITIZE GOOD SLEEP  One of the strongest risk factors for being overweight is poor sleep. When you're feeling tired, you're more likely to choose unhealthy comfort foods and to skip your workout. Additionally, sleep deprivation may slow down your metabolism. Burnett Kanaris! Therefore, sleeping 7-8 hours per night can help with weight loss without having to change your diet or increase your physical activity. And if you feel you snore and still wake up tired, talk with me about sleep apnea.  8.  SET ASIDE TIME TO DISCONNECT  Just get out there. Disconnect from the electronics and connect to the elements. Not only will this help reduce stress (a major factor in weight gain) by giving your mind a break from the constant stimulation we've all become so accustomed to, but it may also reprogram your brain to connect with yourself and what you're feeling.

## 2017-07-12 LAB — BASIC METABOLIC PANEL WITH GFR
BUN/Creatinine Ratio: 17 (calc) (ref 6–22)
BUN: 21 mg/dL (ref 7–25)
CO2: 27 mmol/L (ref 20–32)
Calcium: 10.3 mg/dL (ref 8.6–10.4)
Chloride: 103 mmol/L (ref 98–110)
Creat: 1.21 mg/dL — ABNORMAL HIGH (ref 0.50–0.99)
GFR, EST AFRICAN AMERICAN: 56 mL/min/{1.73_m2} — AB (ref >=60)
GFR, Est Non African American: 48 mL/min/{1.73_m2} — ABNORMAL LOW (ref >=60)
Glucose, Bld: 92 mg/dL (ref 65–99)
POTASSIUM: 4.6 mmol/L (ref 3.5–5.3)
SODIUM: 142 mmol/L (ref 135–146)

## 2017-07-12 LAB — HEPATIC FUNCTION PANEL
AG Ratio: 1.6 (calc) (ref 1.0–2.5)
ALKALINE PHOSPHATASE (APISO): 103 U/L (ref 33–130)
ALT: 21 U/L (ref 6–29)
AST: 29 U/L (ref 10–35)
Albumin: 4.6 g/dL (ref 3.6–5.1)
BILIRUBIN DIRECT: 0.1 mg/dL (ref 0.0–0.2)
BILIRUBIN INDIRECT: 0.4 mg/dL (ref 0.2–1.2)
BILIRUBIN TOTAL: 0.5 mg/dL (ref 0.2–1.2)
Globulin: 2.9 g/dL (calc) (ref 1.9–3.7)
Total Protein: 7.5 g/dL (ref 6.1–8.1)

## 2017-07-12 LAB — CBC WITH DIFFERENTIAL/PLATELET
BASOS ABS: 19 {cells}/uL (ref 0–200)
Basophils Relative: 0.2 %
EOS PCT: 1 %
Eosinophils Absolute: 94 cells/uL (ref 15–500)
HCT: 40.2 % (ref 35.0–45.0)
Hemoglobin: 14 g/dL (ref 11.7–15.5)
Lymphs Abs: 3196 cells/uL (ref 850–3900)
MCH: 32 pg (ref 27.0–33.0)
MCHC: 34.8 g/dL (ref 32.0–36.0)
MCV: 91.8 fL (ref 80.0–100.0)
MONOS PCT: 8.2 %
MPV: 10.7 fL (ref 7.5–12.5)
NEUTROS PCT: 56.6 %
Neutro Abs: 5320 cells/uL (ref 1500–7800)
Platelets: 229 10*3/uL (ref 140–400)
RBC: 4.38 10*6/uL (ref 3.80–5.10)
RDW: 12 % (ref 11.0–15.0)
Total Lymphocyte: 34 %
WBC mixed population: 771 cells/uL (ref 200–950)
WBC: 9.4 10*3/uL (ref 3.8–10.8)

## 2017-07-12 LAB — LIPID PANEL
Cholesterol: 199 mg/dL (ref <200)
HDL: 58 mg/dL (ref >50)
LDL Cholesterol (Calc): 116 mg/dL (calc) — ABNORMAL HIGH
Non-HDL Cholesterol (Calc): 141 mg/dL (calc) — ABNORMAL HIGH (ref <130)
Total CHOL/HDL Ratio: 3.4 (calc) (ref <5.0)
Triglycerides: 133 mg/dL (ref <150)

## 2017-07-12 LAB — TSH: TSH: 1.61 m[IU]/L (ref 0.40–4.50)

## 2017-09-02 ENCOUNTER — Other Ambulatory Visit: Payer: Self-pay | Admitting: Physician Assistant

## 2017-09-05 DIAGNOSIS — I10 Essential (primary) hypertension: Secondary | ICD-10-CM | POA: Diagnosis not present

## 2017-09-05 DIAGNOSIS — E7849 Other hyperlipidemia: Secondary | ICD-10-CM | POA: Diagnosis not present

## 2017-09-05 DIAGNOSIS — I5022 Chronic systolic (congestive) heart failure: Secondary | ICD-10-CM | POA: Diagnosis not present

## 2017-09-05 DIAGNOSIS — I481 Persistent atrial fibrillation: Secondary | ICD-10-CM | POA: Diagnosis not present

## 2017-09-12 DIAGNOSIS — I10 Essential (primary) hypertension: Secondary | ICD-10-CM | POA: Diagnosis not present

## 2017-09-12 DIAGNOSIS — E7849 Other hyperlipidemia: Secondary | ICD-10-CM | POA: Diagnosis not present

## 2017-09-12 DIAGNOSIS — I481 Persistent atrial fibrillation: Secondary | ICD-10-CM | POA: Diagnosis not present

## 2017-09-12 DIAGNOSIS — I5022 Chronic systolic (congestive) heart failure: Secondary | ICD-10-CM | POA: Diagnosis not present

## 2017-09-19 DIAGNOSIS — I10 Essential (primary) hypertension: Secondary | ICD-10-CM | POA: Diagnosis not present

## 2017-09-19 DIAGNOSIS — I482 Chronic atrial fibrillation: Secondary | ICD-10-CM | POA: Diagnosis not present

## 2017-09-19 DIAGNOSIS — I5022 Chronic systolic (congestive) heart failure: Secondary | ICD-10-CM | POA: Diagnosis not present

## 2017-09-30 NOTE — Progress Notes (Signed)
[facility] Lucky Cowboy, M.D.     Dyanne Carrel. Steffanie Dunn, P.A.-C Judd Gaudier, DNP [facility] [address] [place], [place]. [address] Telephone [phone] Telefax [phone] Annual Screening/Preventative Visit & Comprehensive Evaluation &  Examination     This very nice 62 y.o. MWF presents for a Screening/Preventative Visit & comprehensive evaluation and management of multiple medical co-morbidities.  Patient has been followed for HTN, pAfib HLD, Prediabetes  and Vitamin D Deficiency.      Labile HTN predates since 2007.  In 2017, she had an episode of rapid Afib and heart cath was negative  And she was  Cardioverted by Dr Orpah Cobb and she's been on Xarelto since. Recently she's been advised back in Afib and Dr Algie Coffer is adjusting her cardiac meds.  Patient's BP has been controlled at home and patient denies any cardiac symptoms as chest pain, palpitations, shortness of breath, dizziness or ankle swelling. Today's BP is at goal - 116/80.      Patient's hyperlipidemia is not controlled with diet and medications. Patient denies myalgias or other medication SE's. Last lipids were not at goal: Lab Results  Component Value Date   CHOL 199 07/11/2017   HDL 58 07/11/2017   LDLCALC 116 (H) 07/11/2017   TRIG 133 07/11/2017   CHOLHDL 3.4 07/11/2017      Patient is expectantly monitored for prediabetes and patient denies reactive hypoglycemic symptoms, visual blurring, diabetic polys, or paresthesias. Last A1c was Normal & at goal: Lab Results  Component Value Date   HGBA1C 5.5 03/27/2017      Finally, patient has history of Vitamin D Deficiency ("10"/2008)  and last Vitamin D was at goal: Lab Results  Component Value Date   VD25OH 61 03/27/2017   Current Outpatient Medications on File Prior to Visit  Medication Sig  . VITAMIN D 5,000 Units Take  2  times daily.  Marland Kitchen diltiazem (CARDIZEM) 60 MG  TK 1.5 tab 2 x /  day  . lisinopril  5 MG tablet TK 1 PO Q D  . metoprolol50 MG tablet Take  2  times daily.  . rivaroxaban (XARELTO) 20 MG  Take  daily with supper.  . rosuvastatin  40 MG tablet TAKE 1 TAB EVERY DAY   Allergies  Allergen Reactions  . Codeine     REACTION: TREMORS/NERVOUS  . Penicillins     REACTION: RASH/SWELLING   Past Medical History:  Diagnosis Date  . Atrial fibrillation (HCC) 03/22/2016   NEW ONSET   . Hyperlipidemia   . Hypertension   . Pre-diabetes   . Vitamin D deficiency    Health Maintenance  Topic Date Due  . Hepatitis C Screening  20-Jun-1955  . HIV Screening  04/11/1970  . MAMMOGRAM  07/18/2013  . INFLUENZA VACCINE  04/16/2018 (Originally 01/16/2018)  . PAP SMEAR  07/04/2018  . COLONOSCOPY  09/07/2019  . TETANUS/TDAP  09/18/2020   Immunization History  Administered Date(s) Administered  . PPD Test 08/05/2015, 09/05/2016, 10/01/2017  . Tdap 09/19/2010   Last Colon - 03.22.2011 - Dr Leone Payor and recc 10 yr f/u in Mar 2021  Last MGM - sch 5/19 Dr Carrington Clamp  Past Surgical History:  Procedure Laterality Date  . CARDIAC CATHETERIZATION N/A 03/23/2016   Procedure: Right/Left Heart Cath and Coronary Angiography;  Surgeon: Orpah Cobb, MD;  Location: [facility] INVASIVE CV LAB;  Service: Cardiovascular;  Laterality: N/A;  . CARDIOVERSION N/A 07/13/2016   Procedure: CARDIOVERSION;  Surgeon: Viviano Simas  Algie Coffer, MD;  Location: [facility] ENDOSCOPY;  Service: Cardiovascular;  Laterality: N/A;  . CYST FROM WRIST Right   . TUBAL LIGATION     Family History  Problem Relation Age of Onset  . COPD Father   . Heart disease Father   . Hypertension Father   . Hypertension Brother      Social History   Tobacco Use  . Smoking status: Never Smoker  . Smokeless tobacco: Never Used  Substance Use Topics  . Alcohol use: Yes    Alcohol/week: 0.0 oz    Comment: social  . Drug use: No    ROS Constitutional: Denies fever, chills, weight loss/gain, headaches, insomnia,  night sweats,  and change in appetite. Does c/o fatigue. Eyes: Denies redness, blurred vision, diplopia, discharge, itchy, watery eyes.  ENT: Denies discharge, congestion, post nasal drip, epistaxis, sore throat, earache, hearing loss, dental pain, Tinnitus, Vertigo, Sinus pain, snoring.  Cardio: Denies chest pain, palpitations, irregular heartbeat, syncope, dyspnea, diaphoresis, orthopnea, PND, claudication, edema Respiratory: denies cough, dyspnea, DOE, pleurisy, hoarseness, laryngitis, wheezing.  Gastrointestinal: Denies dysphagia, heartburn, reflux, water brash, pain, cramps, nausea, vomiting, bloating, diarrhea, constipation, hematemesis, melena, hematochezia, jaundice, hemorrhoids Genitourinary: Denies dysuria, frequency, urgency, nocturia, hesitancy, discharge, hematuria, flank pain Breast: Breast lumps, nipple discharge, bleeding.  Musculoskeletal: Denies arthralgia, myalgia, stiffness, Jt. Swelling, pain, limp, and strain/sprain. Denies falls. Skin: Denies puritis, rash, hives, warts, acne, eczema, changing in skin lesion Neuro: No weakness, tremor, incoordination, spasms, paresthesia, pain Psychiatric: Denies confusion, memory loss, sensory loss. Denies Depression. Endocrine: Denies change in weight, skin, hair change, nocturia, and paresthesia, diabetic polys, visual blurring, hyper / hypo glycemic episodes.  Heme/Lymph: No excessive bleeding, bruising, enlarged lymph nodes.  Physical Exam  BP 116/80   Pulse 72   Temp (!) 97.3 F (36.3 C)   Resp 16   Ht 5\' 6"  (1.676 m)   Wt 179 lb 3.2 oz (81.3 kg)   BMI 28.92 kg/m   General Appearance: Well nourished, well groomed and in no apparent distress.  Eyes: PERRLA, EOMs, conjunctiva no swelling or erythema, normal fundi and vessels. Sinuses: No frontal/maxillary tenderness ENT/Mouth: EACs patent / TMs  nl. Nares clear without erythema, swelling, mucoid exudates. Oral hygiene is good. No erythema, swelling, or exudate. Tongue normal,  non-obstructing. Tonsils not swollen or erythematous. Hearing normal.  Neck: Supple, thyroid not palpable. No bruits, nodes or JVD. Respiratory: Respiratory effort normal.  BS equal and clear bilateral without rales, rhonci, wheezing or stridor. Cardio: Heart sounds are normal with regular rate and rhythm and no murmurs, rubs or gallops. Peripheral pulses are normal and equal bilaterally without edema. No aortic or femoral bruits. Chest: symmetric with normal excursions and percussion. Breasts: Deferred to GYN Abdomen: Flat, soft with bowel sounds active. Nontender, no guarding, rebound, hernias, masses, or organomegaly.  Lymphatics: Non tender without lymphadenopathy.  Genitourinary: Deferred to GYN Musculoskeletal: Full ROM all peripheral extremities, joint stability, 5/5 strength, and normal gait. Skin: Warm and dry without rashes, lesions, cyanosis, clubbing or  ecchymosis.  Neuro: Cranial nerves intact, reflexes equal bilaterally. Normal muscle tone, no cerebellar symptoms. Sensation intact.  Pysch: Alert and oriented X 3, normal affect, Insight and Judgment appropriate.   Assessment and Plan  1. Annual Preventative Screening Examination  2. Essential hypertension  - EKG 12-Lead - [place], RETROPERITNL ABD,  LTD - Microalbumin / creatinine urine ratio - CBC with Differential/Platelet - BASIC METABOLIC PANEL WITH GFR - Magnesium - TSH  3. Hyperlipidemia, mixed  - EKG 12-Lead - [place],  RETROPERITNL ABD,  LTD - Hepatic function panel - Lipid panel - TSH  4. Other abnormal glucose  - Hemoglobin A1c - Insulin, random  5. Vitamin D deficiency  - VITAMIN D 25 Hydroxyl  6. Paroxysmal atrial fibrillation (HCC)  - EKG 12-Lead - TSH  7. Screening for colorectal cancer  - POC Hemoccult Bld/Stl  8. Screening for ischemic heart disease  - EKG 12-Lead  9. Screening for AAA (aortic abdominal aneurysm)  - [place], RETROPERITNL ABD,  LTD  10. Screening examination for pulmonary  tuberculosis   11. Fatigue, unspecified type  - Iron,Total/Total Iron Binding Cap - Vitamin B12 - CBC with Differential/Platelet  12. Medication management  - Urinalysis, Routine w reflex microscopic - Microalbumin / creatinine urine ratio - CBC with Differential/Platelet - BASIC METABOLIC PANEL WITH GFR - Hepatic function panel - Magnesium - Lipid panel - TSH - Hemoglobin A1c - Insulin, random - VITAMIN D 25 Hydroxyl           Patient was counseled in prudent diet to achieve/maintain BMI less than 25 for weight control, BP monitoring, regular exercise and medications. Discussed med's effects and SE's. Screening labs and tests as requested with regular follow-up as recommended. Over 40 minutes of exam, counseling, chart review and high complex critical decision making was performed.

## 2017-09-30 NOTE — Patient Instructions (Signed)
Preventive Care for Adults  A healthy lifestyle and preventive care can promote health and wellness. Preventive health guidelines for women include the following key practices.  A routine yearly physical is a good way to check with your health care provider about your health and preventive screening. It is a chance to share any concerns and updates on your health and to receive a thorough exam.  Visit your dentist for a routine exam and preventive care every 6 months. Brush your teeth twice a day and floss once a day. Good oral hygiene prevents tooth decay and gum disease.  The frequency of eye exams is based on your age, health, family medical history, use of contact lenses, and other factors. Follow your health care provider's recommendations for frequency of eye exams.  Eat a healthy diet. Foods like vegetables, fruits, whole grains, low-fat dairy products, and lean protein foods contain the nutrients you need without too many calories. Decrease your intake of foods high in solid fats, added sugars, and salt. Eat the right amount of calories for you. Get information about a proper diet from your health care provider, if necessary.  Regular physical exercise is one of the most important things you can do for your health. Most adults should get at least 150 minutes of moderate-intensity exercise (any activity that increases your heart rate and causes you to sweat) each week. In addition, most adults need muscle-strengthening exercises on 2 or more days a week.  Maintain a healthy weight. The body mass index (BMI) is a screening tool to identify possible weight problems. It provides an estimate of body fat based on height and weight. Your health care provider can find your BMI and can help you achieve or maintain a healthy weight. For adults 20 years and older:  A BMI below 18.5 is considered underweight.  A BMI of 18.5 to 24.9 is normal.  A BMI of 25 to 29.9 is considered overweight.  A BMI of  30 and above is considered obese.  Maintain normal blood lipids and cholesterol levels by exercising and minimizing your intake of saturated fat. Eat a balanced diet with plenty of fruit and vegetables. Blood tests for lipids and cholesterol should begin at age 53 and be repeated every 5 years. If your lipid or cholesterol levels are high, you are over 50, or you are at high risk for heart disease, you may need your cholesterol levels checked more frequently. Ongoing high lipid and cholesterol levels should be treated with medicines if diet and exercise are not working.  If you smoke, find out from your health care provider how to quit. If you do not use tobacco, do not start.  Lung cancer screening is recommended for adults aged 48-80 years who are at high risk for developing lung cancer because of a history of smoking. A yearly low-dose CT scan of the lungs is recommended for people who have at least a 30-pack-year history of smoking and are a current smoker or have quit within the past 15 years. A pack year of smoking is smoking an average of 1 pack of cigarettes a day for 1 year (for example: 1 pack a day for 30 years or 2 packs a day for 15 years). Yearly screening should continue until the smoker has stopped smoking for at least 15 years. Yearly screening should be stopped for people who develop a health problem that would prevent them from having lung cancer treatment.  High blood pressure causes heart disease and increases  the risk of stroke. Your blood pressure should be checked at least every 1 to 2 years. Ongoing high blood pressure should be treated with medicines if weight loss and exercise do not work.  If you are 17-78 years old, ask your health care provider if you should take aspirin to prevent strokes.  Diabetes screening involves taking a blood sample to check your fasting blood sugar level. This should be done once every 3 years, after age 91, if you are within normal weight and  without risk factors for diabetes. Testing should be considered at a younger age or be carried out more frequently if you are overweight and have at least 1 risk factor for diabetes.  Breast cancer screening is essential preventive care for women. You should practice "breast self-awareness." This means understanding the normal appearance and feel of your breasts and may include breast self-examination. Any changes detected, no matter how small, should be reported to a health care provider. Women in their 51s and 30s should have a clinical breast exam (CBE) by a health care provider as part of a regular health exam every 1 to 3 years. After age 76, women should have a CBE every year. Starting at age 47, women should consider having a mammogram (breast X-ray test) every year. Women who have a family history of breast cancer should talk to their health care provider about genetic screening. Women at a high risk of breast cancer should talk to their health care providers about having an MRI and a mammogram every year.  Breast cancer gene (BRCA)-related cancer risk assessment is recommended for women who have family members with BRCA-related cancers. BRCA-related cancers include breast, ovarian, tubal, and peritoneal cancers. Having family members with these cancers may be associated with an increased risk for harmful changes (mutations) in the breast cancer genes BRCA1 and BRCA2. Results of the assessment will determine the need for genetic counseling and BRCA1 and BRCA2 testing.  Routine pelvic exams to screen for cancer are no longer recommended for nonpregnant women who are considered low risk for cancer of the pelvic organs (ovaries, uterus, and vagina) and who do not have symptoms. Ask your health care provider if a screening pelvic exam is right for you.  If you have had past treatment for cervical cancer or a condition that could lead to cancer, you need Pap tests and screening for cancer for at least 20  years after your treatment. If Pap tests have been discontinued, your risk factors (such as having a new sexual partner) need to be reassessed to determine if screening should be resumed. Some women have medical problems that increase the chance of getting cervical cancer. In these cases, your health care provider may recommend more frequent screening and Pap tests.  Colorectal cancer can be detected and often prevented. Most routine colorectal cancer screening begins at the age of 58 years and continues through age 89 years. However, your health care provider may recommend screening at an earlier age if you have risk factors for colon cancer. On a yearly basis, your health care provider may provide home test kits to check for hidden blood in the stool. Use of a small camera at the end of a tube, to directly examine the colon (sigmoidoscopy or colonoscopy), can detect the earliest forms of colorectal cancer. Talk to your health care provider about this at age 57, when routine screening begins.  Direct exam of the colon should be repeated every 5-10 years through age 24 years, unless  early forms of pre-cancerous polyps or small growths are found.  Hepatitis C blood testing is recommended for all people born from 1945 through 1965 and any individual with known risks for hepatitis C.  Pra  Osteoporosis is a disease in which the bones lose minerals and strength with aging. This can result in serious bone fractures or breaks. The risk of osteoporosis can be identified using a bone density scan. Women ages 65 years and over and women at risk for fractures or osteoporosis should discuss screening with their health care providers. Ask your health care provider whether you should take a calcium supplement or vitamin D to reduce the rate of osteoporosis.  Menopause can be associated with physical symptoms and risks. Hormone replacement therapy is available to decrease symptoms and risks. You should talk to your  health care provider about whether hormone replacement therapy is right for you.  Use sunscreen. Apply sunscreen liberally and repeatedly throughout the day. You should seek shade when your shadow is shorter than you. Protect yourself by wearing long sleeves, pants, a wide-brimmed hat, and sunglasses year round, whenever you are outdoors.  Once a month, do a whole body skin exam, using a mirror to look at the skin on your back. Tell your health care provider of new moles, moles that have irregular borders, moles that are larger than a pencil eraser, or moles that have changed in shape or color.  Stay current with required vaccines (immunizations).  Influenza vaccine. All adults should be immunized every year.  Tetanus, diphtheria, and acellular pertussis (Td, Tdap) vaccine. Pregnant women should receive 1 dose of Tdap vaccine during each pregnancy. The dose should be obtained regardless of the length of time since the last dose. Immunization is preferred during the 27th-36th week of gestation. An adult who has not previously received Tdap or who does not know her vaccine status should receive 1 dose of Tdap. This initial dose should be followed by tetanus and diphtheria toxoids (Td) booster doses every 10 years. Adults with an unknown or incomplete history of completing a 3-dose immunization series with Td-containing vaccines should begin or complete a primary immunization series including a Tdap dose. Adults should receive a Td booster every 10 years.  Varicella vaccine. An adult without evidence of immunity to varicella should receive 2 doses or a second dose if she has previously received 1 dose. Pregnant females who do not have evidence of immunity should receive the first dose after pregnancy. This first dose should be obtained before leaving the health care facility. The second dose should be obtained 4-8 weeks after the first dose.  Human papillomavirus (HPV) vaccine. Females aged 13-26 years  who have not received the vaccine previously should obtain the 3-dose series. The vaccine is not recommended for use in pregnant females. However, pregnancy testing is not needed before receiving a dose. If a female is found to be pregnant after receiving a dose, no treatment is needed. In that case, the remaining doses should be delayed until after the pregnancy. Immunization is recommended for any person with an immunocompromised condition through the age of 26 years if she did not get any or all doses earlier. During the 3-dose series, the second dose should be obtained 4-8 weeks after the first dose. The third dose should be obtained 24 weeks after the first dose and 16 weeks after the second dose.  Zoster vaccine. One dose is recommended for adults aged 60 years or older unless certain conditions are present.    Measles, mumps, and rubella (MMR) vaccine. Adults born before [birth year] generally are considered immune to measles and mumps. Adults born in [birth year] or later should have 1 or more doses of MMR vaccine unless there is a contraindication to the vaccine or there is laboratory evidence of immunity to each of the three diseases. A routine second dose of MMR vaccine should be obtained at least 28 days after the first dose for students attending postsecondary schools, health care workers, or international travelers. People who received inactivated measles vaccine or an unknown type of measles vaccine during 1963-1967 should receive 2 doses of MMR vaccine. People who received inactivated mumps vaccine or an unknown type of mumps vaccine before 1979 and are at high risk for mumps infection should consider immunization with 2 doses of MMR vaccine. For females of childbearing age, rubella immunity should be determined. If there is no evidence of immunity, females who are not pregnant should be vaccinated. If there is no evidence of immunity, females who are pregnant should delay immunization until after pregnancy.  Unvaccinated health care workers born before [birth year] who lack laboratory evidence of measles, mumps, or rubella immunity or laboratory confirmation of disease should consider measles and mumps immunization with 2 doses of MMR vaccine or rubella immunization with 1 dose of MMR vaccine.  Pneumococcal 13-valent conjugate (PCV13) vaccine. When indicated, a person who is uncertain of her immunization history and has no record of immunization should receive the PCV13 vaccine. An adult aged 42 years or older who has certain medical conditions and has not been previously immunized should receive 1 dose of PCV13 vaccine. This PCV13 should be followed with a dose of pneumococcal polysaccharide (PPSV23) vaccine. The PPSV23 vaccine dose should be obtained at least 1 or more year(s) after the dose of PCV13 vaccine. An adult aged 46 years or older who has certain medical conditions and previously received 1 or more doses of PPSV23 vaccine should receive 1 dose of PCV13. The PCV13 vaccine dose should be obtained 1 or more years after the last PPSV23 vaccine dose.    Pneumococcal polysaccharide (PPSV23) vaccine. When PCV13 is also indicated, PCV13 should be obtained first. All adults aged 58 years and older should be immunized. An adult younger than age 54 years who has certain medical conditions should be immunized. Any person who resides in a nursing home or long-term care facility should be immunized. An adult smoker should be immunized. People with an immunocompromised condition and certain other conditions should receive both PCV13 and PPSV23 vaccines. People with human immunodeficiency virus (HIV) infection should be immunized as soon as possible after diagnosis. Immunization during chemotherapy or radiation therapy should be avoided. Routine use of PPSV23 vaccine is not recommended for American Indians, [place] Natives, or people younger than 65 years unless there are medical conditions that require PPSV23 vaccine. When  indicated, people who have unknown immunization and have no record of immunization should receive PPSV23 vaccine. One-time revaccination 5 years after the first dose of PPSV23 is recommended for people aged 19-64 years who have chronic kidney failure, nephrotic syndrome, asplenia, or immunocompromised conditions. People who received 1-2 doses of PPSV23 before age 97 years should receive another dose of PPSV23 vaccine at age 79 years or later if at least 5 years have passed since the previous dose. Doses of PPSV23 are not needed for people immunized with PPSV23 at or after age 39 years.  Preventive Services / Frequency   Ages 68 to 35 years  Blood pressure check.  Lipid and cholesterol check.  Lung cancer screening. / Every year if you are aged 63-80 years and have a 30-pack-year history of smoking and currently smoke or have quit within the past 15 years. Yearly screening is stopped once you have quit smoking for at least 15 years or develop a health problem that would prevent you from having lung cancer treatment.  Clinical breast exam.** / Every year after age 50 years.   BRCA-related cancer risk assessment.** / For women who have family members with a BRCA-related cancer (breast, ovarian, tubal, or peritoneal cancers).  Mammogram.** / Every year beginning at age 37 years and continuing for as long as you are in good health. Consult with your health care provider.  Pap test.** / Every 3 years starting at age 65 years through age 67 or 22 years with a history of 3 consecutive normal Pap tests.  HPV screening.** / Every 3 years from ages 62 years through ages 99 to 25 years with a history of 3 consecutive normal Pap tests.  Fecal occult blood test (FOBT) of stool. / Every year beginning at age 41 years and continuing until age 39 years. You may not need to do this test if you get a colonoscopy every 10 years.  Flexible sigmoidoscopy or colonoscopy.** / Every 5 years for a flexible  sigmoidoscopy or every 10 years for a colonoscopy beginning at age 38 years and continuing until age 68 years.  Hepatitis C blood test.** / For all people born from [birth year] through 1965 and any individual with known risks for hepatitis C.  Skin self-exam. / Monthly.  Influenza vaccine. / Every year.  Tetanus, diphtheria, and acellular pertussis (Tdap/Td) vaccine.** / Consult your health care provider. Pregnant women should receive 1 dose of Tdap vaccine during each pregnancy. 1 dose of Td every 10 years.  Varicella vaccine.** / Consult your health care provider. Pregnant females who do not have evidence of immunity should receive the first dose after pregnancy.  Zoster vaccine.** / 1 dose for adults aged 60 years or older.  Pneumococcal 13-valent conjugate (PCV13) vaccine.** / Consult your health care provider.  Pneumococcal polysaccharide (PPSV23) vaccine.** / 1 to 2 doses if you smoke cigarettes or if you have certain conditions.  Meningococcal vaccine.** / Consult your health care provider.  Hepatitis A vaccine.** / Consult your health care provider.  Hepatitis B vaccine.** / Consult your health care provider. Screening for abdominal aortic aneurysm (AAA)  by ultrasound is recommended for people over 50 who have history of high blood pressure or who are current or former smokers. ++++++++++++++++++ Recommend Adult Low Dose Aspirin or  coated  Aspirin 81 mg daily  To reduce risk of Colon Cancer 20 %,  Skin Cancer 26 % ,  Melanoma 46%  and  Pancreatic cancer 60% +++++++++++++++++++ Vitamin D goal  is between 70-100.  Please make sure that you are taking your Vitamin D as directed.  It is very important as a natural anti-inflammatory  helping hair, skin, and nails, as well as reducing stroke and heart attack risk.  It helps your bones and helps with mood. It also decreases numerous cancer risks so please take it as directed.  Low Vit D is associated with a 200-300% higher risk  for CANCER  and 200-300% higher risk for HEART   ATTACK  &  STROKE.   .....................................Marland Kitchen It is also associated with higher death rate at younger ages,  autoimmune diseases like Rheumatoid arthritis, Lupus, Multiple Sclerosis.  Also many other serious conditions, like depression, Alzheimer's Dementia, infertility, muscle aches, fatigue, fibromyalgia - just to name a few. ++++++++++++++++++ Recommend the book "The END of DIETING" by Dr Excell Seltzer  & the book "The END of DIABETES " by Dr Excell Seltzer At [email or web address] - get book & Audio CD's    Being diabetic has a  300% increased risk for heart attack, stroke, cancer, and alzheimer- type vascular dementia. It is very important that you work harder with diet by avoiding all foods that are white. Avoid white rice (brown & wild rice is OK), white potatoes (sweetpotatoes in moderation is OK), White bread or wheat bread or anything made out of white flour like bagels, donuts, rolls, buns, biscuits, cakes, pastries, cookies, pizza crust, and pasta (made from white flour & egg whites) - vegetarian pasta or spinach or wheat pasta is OK. Multigrain breads like Arnold's or Pepper[employer], or multigrain sandwich thins or flatbreads.  Diet, exercise and weight loss can reverse and cure diabetes in the early stages.  Diet, exercise and weight loss is very important in the control and prevention of complications of diabetes which affects every system in your body, ie. Brain - dementia/stroke, eyes - glaucoma/blindness, heart - heart attack/heart failure, kidneys - dialysis, stomach - gastric paralysis, intestines - malabsorption, nerves - severe painful neuritis, circulation - gangrene & loss of a leg(s), and finally cancer and Alzheimers.    I recommend avoid fried & greasy foods,  sweets/candy, white rice (brown or wild rice or Quinoa is OK), white potatoes (sweet potatoes are OK) - anything made from white flour - bagels, doughnuts, rolls, buns,  biscuits,white and wheat breads, pizza crust and traditional pasta made of white flour & egg white(vegetarian pasta or spinach or wheat pasta is OK).  Multi-grain bread is OK - like multi-grain flat bread or sandwich thins. Avoid alcohol in excess. Exercise is also important.    Eat all the vegetables you want - avoid meat, especially red meat and dairy - especially cheese.  Cheese is the most concentrated form of trans-fats which is the worst thing to clog up our arteries. Veggie cheese is OK which can be found in the fresh produce section at [employer] or [employer] or [employer]  ++++++++++++++++++++++ DASH Eating Plan  DASH stands for "Dietary Approaches to Stop Hypertension."   The DASH eating plan is a healthy eating plan that has been shown to reduce high blood pressure (hypertension). Additional health benefits may include reducing the risk of type 2 diabetes mellitus, heart disease, and stroke. The DASH eating plan may also help with weight loss. WHAT DO I NEED TO KNOW ABOUT THE DASH EATING PLAN? For the DASH eating plan, you will follow these general guidelines:  Choose foods with a percent daily value for sodium of less than 5% (as listed on the food label).  Use salt-free seasonings or herbs instead of table salt or sea salt.  Check with your health care provider or pharmacist before using salt substitutes.  Eat lower-sodium products, often labeled as "lower sodium" or "no salt added."  Eat fresh foods.  Eat more vegetables, fruits, and low-fat dairy products.  Choose whole grains. Look for the word "whole" as the first word in the ingredient list.  Choose fish   Limit sweets, desserts, sugars, and sugary drinks.  Choose heart-healthy fats.  Eat veggie cheese   Eat more home-cooked food and less restaurant, buffet, and fast food.  Limit fried foods.  Cook foods using  methods other than frying.  Limit canned vegetables. If you do use them, rinse them well to  decrease the sodium.  When eating at a restaurant, ask that your food be prepared with less salt, or no salt if possible.                      WHAT FOODS CAN I EAT? Read Dr Fara Olden Fuhrman's books on The End of Dieting & The End of Diabetes  Grains Whole grain or whole wheat bread. Brown rice. Whole grain or whole wheat pasta. Quinoa, bulgur, and whole grain cereals. Low-sodium cereals. Corn or whole wheat flour tortillas. Whole grain cornbread. Whole grain crackers. Low-sodium crackers.  Vegetables Fresh or frozen vegetables (raw, steamed, roasted, or grilled). Low-sodium or reduced-sodium tomato and vegetable juices. Low-sodium or reduced-sodium tomato sauce and paste. Low-sodium or reduced-sodium canned vegetables.   Fruits All fresh, canned (in natural juice), or frozen fruits.  Protein Products  All fish and seafood.  Dried beans, peas, or lentils. Unsalted nuts and seeds. Unsalted canned beans.  Dairy Low-fat dairy products, such as skim or 1% milk, 2% or reduced-fat cheeses, low-fat ricotta or cottage cheese, or plain low-fat yogurt. Low-sodium or reduced-sodium cheeses.  Fats and Oils Tub margarines without trans fats. Light or reduced-fat mayonnaise and salad dressings (reduced sodium). Avocado. Safflower, olive, or canola oils. Natural peanut or almond butter.  Other Unsalted popcorn and pretzels. The items listed above may not be a complete list of recommended foods or beverages. Contact your dietitian for more options.  ++++++++++++++++++  WHAT FOODS ARE NOT RECOMMENDED? Grains/ White flour or wheat flour White bread. White pasta. White rice. Refined cornbread. Bagels and croissants. Crackers that contain trans fat.  Vegetables  Creamed or fried vegetables. Vegetables in a . Regular canned vegetables. Regular canned tomato sauce and paste. Regular tomato and vegetable juices.  Fruits Dried fruits. Canned fruit in light or heavy syrup. Fruit juice.  Meat and Other  Protein Products Meat in general - RED meat & White meat.  Fatty cuts of meat. Ribs, chicken wings, all processed meats as bacon, sausage, bologna, salami, fatback, hot dogs, bratwurst and packaged luncheon meats.  Dairy Whole or 2% milk, cream, half-and-half, and cream cheese. Whole-fat or sweetened yogurt. Full-fat cheeses or blue cheese. Non-dairy creamers and whipped toppings. Processed cheese, cheese spreads, or cheese curds.  Condiments Onion and garlic salt, seasoned salt, table salt, and sea salt. Canned and packaged gravies. Worcestershire sauce. Tartar sauce. Barbecue sauce. Teriyaki sauce. Soy sauce, including reduced sodium. Steak sauce. Fish sauce. Oyster sauce. Cocktail sauce. Horseradish. Ketchup and mustard. Meat flavorings and tenderizers. Bouillon cubes. Hot sauce. Tabasco sauce. Marinades. Taco seasonings. Relishes.  Fats and Oils Butter, stick margarine, lard, shortening and bacon fat. Coconut, palm kernel, or palm oils. Regular salad dressings.  Pickles and olives. Salted popcorn and pretzels.  The items listed above may not be a complete list of foods and beverages to avoid.

## 2017-10-01 ENCOUNTER — Ambulatory Visit: Payer: BLUE CROSS/BLUE SHIELD | Admitting: Internal Medicine

## 2017-10-01 ENCOUNTER — Encounter: Payer: Self-pay | Admitting: Internal Medicine

## 2017-10-01 VITALS — BP 116/80 | HR 72 | Temp 97.3°F | Resp 16 | Ht 66.0 in | Wt 179.2 lb

## 2017-10-01 DIAGNOSIS — Z79899 Other long term (current) drug therapy: Secondary | ICD-10-CM | POA: Diagnosis not present

## 2017-10-01 DIAGNOSIS — Z1329 Encounter for screening for other suspected endocrine disorder: Secondary | ICD-10-CM

## 2017-10-01 DIAGNOSIS — Z1211 Encounter for screening for malignant neoplasm of colon: Secondary | ICD-10-CM

## 2017-10-01 DIAGNOSIS — I10 Essential (primary) hypertension: Secondary | ICD-10-CM

## 2017-10-01 DIAGNOSIS — Z111 Encounter for screening for respiratory tuberculosis: Secondary | ICD-10-CM

## 2017-10-01 DIAGNOSIS — E559 Vitamin D deficiency, unspecified: Secondary | ICD-10-CM

## 2017-10-01 DIAGNOSIS — Z0000 Encounter for general adult medical examination without abnormal findings: Secondary | ICD-10-CM | POA: Diagnosis not present

## 2017-10-01 DIAGNOSIS — Z1389 Encounter for screening for other disorder: Secondary | ICD-10-CM | POA: Diagnosis not present

## 2017-10-01 DIAGNOSIS — Z131 Encounter for screening for diabetes mellitus: Secondary | ICD-10-CM

## 2017-10-01 DIAGNOSIS — Z130 Encounter for screening for diseases of the blood and blood-forming organs and certain disorders involving the immune mechanism: Secondary | ICD-10-CM

## 2017-10-01 DIAGNOSIS — Z0001 Encounter for general adult medical examination with abnormal findings: Secondary | ICD-10-CM

## 2017-10-01 DIAGNOSIS — Z1212 Encounter for screening for malignant neoplasm of rectum: Secondary | ICD-10-CM

## 2017-10-01 DIAGNOSIS — E782 Mixed hyperlipidemia: Secondary | ICD-10-CM

## 2017-10-01 DIAGNOSIS — Z13220 Encounter for screening for lipoid disorders: Secondary | ICD-10-CM

## 2017-10-01 DIAGNOSIS — R7309 Other abnormal glucose: Secondary | ICD-10-CM

## 2017-10-01 DIAGNOSIS — Z136 Encounter for screening for cardiovascular disorders: Secondary | ICD-10-CM | POA: Diagnosis not present

## 2017-10-01 DIAGNOSIS — I480 Paroxysmal atrial fibrillation: Secondary | ICD-10-CM

## 2017-10-01 DIAGNOSIS — R5383 Other fatigue: Secondary | ICD-10-CM

## 2017-10-02 LAB — HEPATIC FUNCTION PANEL
AG Ratio: 1.8 (calc) (ref 1.0–2.5)
ALBUMIN MSPROF: 4.7 g/dL (ref 3.6–5.1)
ALT: 17 U/L (ref 6–29)
AST: 24 U/L (ref 10–35)
Alkaline phosphatase (APISO): 102 U/L (ref 33–130)
BILIRUBIN DIRECT: 0.1 mg/dL (ref 0.0–0.2)
BILIRUBIN INDIRECT: 0.5 mg/dL (ref 0.2–1.2)
BILIRUBIN TOTAL: 0.6 mg/dL (ref 0.2–1.2)
Globulin: 2.6 g/dL (calc) (ref 1.9–3.7)
Total Protein: 7.3 g/dL (ref 6.1–8.1)

## 2017-10-02 LAB — CBC WITH DIFFERENTIAL/PLATELET
BASOS PCT: 0.4 %
Basophils Absolute: 29 cells/uL (ref 0–200)
EOS PCT: 1.6 %
Eosinophils Absolute: 117 cells/uL (ref 15–500)
HCT: 43.1 % (ref 35.0–45.0)
HEMOGLOBIN: 14.8 g/dL (ref 11.7–15.5)
Lymphs Abs: 2862 cells/uL (ref 850–3900)
MCH: 31.7 pg (ref 27.0–33.0)
MCHC: 34.3 g/dL (ref 32.0–36.0)
MCV: 92.3 fL (ref 80.0–100.0)
MONOS PCT: 9.1 %
MPV: 11.1 fL (ref 7.5–12.5)
NEUTROS ABS: 3628 {cells}/uL (ref 1500–7800)
Neutrophils Relative %: 49.7 %
PLATELETS: 244 10*3/uL (ref 140–400)
RBC: 4.67 10*6/uL (ref 3.80–5.10)
RDW: 12.2 % (ref 11.0–15.0)
TOTAL LYMPHOCYTE: 39.2 %
WBC mixed population: 664 cells/uL (ref 200–950)
WBC: 7.3 10*3/uL (ref 3.8–10.8)

## 2017-10-02 LAB — IRON, TOTAL/TOTAL IRON BINDING CAP
%SAT: 33 % (ref 11–50)
Iron: 117 ug/dL (ref 45–160)
TIBC: 354 ug/dL (ref 250–450)

## 2017-10-02 LAB — BASIC METABOLIC PANEL WITH GFR
BUN / CREAT RATIO: 14 (calc) (ref 6–22)
BUN: 15 mg/dL (ref 7–25)
CALCIUM: 10.1 mg/dL (ref 8.6–10.4)
CO2: 29 mmol/L (ref 20–32)
Chloride: 105 mmol/L (ref 98–110)
Creat: 1.04 mg/dL — ABNORMAL HIGH (ref 0.50–0.99)
GFR, EST AFRICAN AMERICAN: 67 mL/min/{1.73_m2} (ref >=60)
GFR, EST NON AFRICAN AMERICAN: 58 mL/min/{1.73_m2} — AB (ref >=60)
Glucose, Bld: 98 mg/dL (ref 65–99)
POTASSIUM: 5.7 mmol/L — AB (ref 3.5–5.3)
Sodium: 141 mmol/L (ref 135–146)

## 2017-10-02 LAB — URINALYSIS, ROUTINE W REFLEX MICROSCOPIC
BILIRUBIN URINE: NEGATIVE
Glucose, UA: NEGATIVE
Hgb urine dipstick: NEGATIVE
Ketones, ur: NEGATIVE
LEUKOCYTES UA: NEGATIVE
Nitrite: NEGATIVE
PROTEIN: NEGATIVE
Specific Gravity, Urine: 1.005 (ref 1.001–1.03)
pH: 6.5 (ref 5.0–8.0)

## 2017-10-02 LAB — LIPID PANEL
Cholesterol: 168 mg/dL (ref <200)
HDL: 55 mg/dL (ref >50)
LDL CHOLESTEROL (CALC): 89 mg/dL
NON-HDL CHOLESTEROL (CALC): 113 mg/dL (ref <130)
TRIGLYCERIDES: 138 mg/dL (ref <150)
Total CHOL/HDL Ratio: 3.1 (calc) (ref <5.0)

## 2017-10-02 LAB — MICROALBUMIN / CREATININE URINE RATIO
CREATININE, URINE: 10 mg/dL — AB (ref 20–275)
MICROALB/CREAT RATIO: 20 ug/mg{creat} (ref <30)
Microalb, Ur: 0.2 mg/dL

## 2017-10-02 LAB — HEMOGLOBIN A1C
EAG (MMOL/L): 6.8 (calc)
Hgb A1c MFr Bld: 5.9 % of total Hgb — ABNORMAL HIGH (ref <5.7)
MEAN PLASMA GLUCOSE: 123 (calc)

## 2017-10-02 LAB — INSULIN, RANDOM: INSULIN: 6.1 u[IU]/mL (ref 2.0–19.6)

## 2017-10-02 LAB — VITAMIN B12: VITAMIN B 12: 927 pg/mL (ref 200–1100)

## 2017-10-02 LAB — VITAMIN D 25 HYDROXY (VIT D DEFICIENCY, FRACTURES): VIT D 25 HYDROXY: 60 ng/mL (ref 30–100)

## 2017-10-02 LAB — MAGNESIUM: Magnesium: 2 mg/dL (ref 1.5–2.5)

## 2017-10-02 LAB — TSH: TSH: 1.42 m[IU]/L (ref 0.40–4.50)

## 2017-10-03 LAB — TB SKIN TEST
Induration: 0 mm
TB Skin Test: NEGATIVE

## 2017-10-22 DIAGNOSIS — Z1231 Encounter for screening mammogram for malignant neoplasm of breast: Secondary | ICD-10-CM | POA: Diagnosis not present

## 2017-10-22 DIAGNOSIS — Z6828 Body mass index (BMI) 28.0-28.9, adult: Secondary | ICD-10-CM | POA: Diagnosis not present

## 2017-10-22 DIAGNOSIS — Z01419 Encounter for gynecological examination (general) (routine) without abnormal findings: Secondary | ICD-10-CM | POA: Diagnosis not present

## 2017-11-20 DIAGNOSIS — I5022 Chronic systolic (congestive) heart failure: Secondary | ICD-10-CM | POA: Diagnosis not present

## 2017-11-20 DIAGNOSIS — I482 Chronic atrial fibrillation: Secondary | ICD-10-CM | POA: Diagnosis not present

## 2017-11-20 DIAGNOSIS — I10 Essential (primary) hypertension: Secondary | ICD-10-CM | POA: Diagnosis not present

## 2017-11-28 ENCOUNTER — Other Ambulatory Visit: Payer: Self-pay | Admitting: Adult Health

## 2018-01-23 NOTE — Progress Notes (Signed)
FOLLOW UP  Assessment and Plan:   Hypertension Well controlled with current medications  Monitor blood pressure at home; patient to call if consistently greater than 130/80 Continue DASH diet.   Reminder to go to the ER if any CP, SOB, nausea, dizziness, severe HA, changes vision/speech, left arm numbness and tingling and jaw pain.  Cholesterol Currently at goal; continue with statin  Continue low cholesterol diet and exercise.   Check lipid panel.   Other abnormal glucose Continue diet and exercise.  Perform daily foot/skin check, notify office of any concerning changes.  Check A1C  Overweight Long discussion about weight loss, diet, and exercise Recommended diet heavy in fruits and veggies and low in animal meats, cheeses, and dairy products, appropriate calorie intake Discussed ideal weight for height  and initial weight goal (175 lb) Patient will work on -sticking to new low carb diet, exercising, start walking when it get's warm Will follow up in 3 months  Vitamin D Def Near goal at last visit; continue supplementation to maintain goal of 70-100 Defer Vit D level  A fib with RVR Rate controlled at this time No concerns with excessive bleeding, no falls Continue medication: xarelto       Continue follow up with cardiology  Continue diet and meds as discussed. Further disposition pending results of labs. Discussed med's effects and SE's.   Over 30 minutes of exam, counseling, chart review, and critical decision making was performed.   Future Appointments  Date Time Provider Department Center  04/28/2018 10:30 AM Lucky Cowboy, MD [facility] None  10/29/2018  9:00 AM Lucky Cowboy, MD [facility] None    ----------------------------------------------------------------------------------------------------------------------  HPI 62 y.o. female  presents for 3 month follow up on hypertension, cholesterol, hx of abnormal glucose, weight and vitamin D deficiency.  She has history of Afib, on xarelto, had cardioversion in Jan 2018, now treated by lopressor and diltiazem. EF was 40% via cath 03/2016. She follows with cardiology.  BMI is Body mass index is 29.05 kg/m., she has been working on diet and exercise - following a low carb diet. She is trying to get up hourly and drink water. Walking at lunch breaks 20-30 min. Would like to get down to 165 lb this year.  Wt Readings from Last 3 Encounters:  01/24/18 180 lb (81.6 kg)  10/01/17 179 lb 3.2 oz (81.3 kg)  07/11/17 181 lb (82.1 kg)   Her blood pressure has been controlled at home, today their BP is BP: 124/76  She does workout. She denies chest pain, shortness of breath, dizziness.   She is on cholesterol medication (rosuvastatin 40 mg every other day) and denies myalgias. Her cholesterol is at goal. The cholesterol last visit was:   Lab Results  Component Value Date   CHOL 168 10/01/2017   HDL 55 10/01/2017   LDLCALC 89 10/01/2017   TRIG 138 10/01/2017   CHOLHDL 3.1 10/01/2017    She has been working on diet and exercise for glucose management, and denies increased appetite, nausea, paresthesia of the feet, polydipsia, polyuria, visual disturbances and vomiting. Last A1C in the office was:  Lab Results  Component Value Date   HGBA1C 5.9 (H) 10/01/2017   Patient is on Vitamin D supplement and near goal of 70 at recent check:   Lab Results  Component Value Date   VD25OH 60 10/01/2017        Current Medications:  Current Outpatient Medications on File Prior to Visit  Medication Sig  . Cholecalciferol (VITAMIN  D PO) Take 5,000 Units by mouth 2 (two) times daily.  Marland Kitchen diltiazem (CARDIZEM) 60 MG tablet TK 1 T PO  TID.  Marland Kitchen lisinopril (PRINIVIL,ZESTRIL) 5 MG tablet TK 1 PO Q D  . metoprolol (LOPRESSOR) 50 MG tablet Take 50 mg by mouth 2 (two) times daily.  . rivaroxaban (XARELTO) 20 MG TABS tablet Take 20 mg by mouth daily with supper.  . rosuvastatin (CRESTOR) 40 MG tablet TAKE 1 TABLET BY  MOUTH EVERY DAY (Patient taking differently: Take one tablet by mouth every other day)   No current facility-administered medications on file prior to visit.      Allergies:  Allergies  Allergen Reactions  . Codeine     REACTION: TREMORS/NERVOUS  . Penicillins     REACTION: RASH/SWELLING     Medical History:  Past Medical History:  Diagnosis Date  . Atrial fibrillation (HCC) 03/22/2016   NEW ONSET   . Hyperlipidemia   . Hypertension   . Pre-diabetes   . Vitamin D deficiency    Family history- Reviewed and unchanged Social history- Reviewed and unchanged   Review of Systems:  Review of Systems  Constitutional: Negative for malaise/fatigue and weight loss.  HENT: Negative for hearing loss and tinnitus.   Eyes: Negative for blurred vision and double vision.  Respiratory: Negative for cough, shortness of breath and wheezing.   Cardiovascular: Negative for chest pain, palpitations, orthopnea, claudication and leg swelling.  Gastrointestinal: Negative for abdominal pain, blood in stool, constipation, diarrhea, heartburn, melena, nausea and vomiting.  Genitourinary: Negative.   Musculoskeletal: Negative for joint pain and myalgias.  Skin: Negative for rash.  Neurological: Negative for dizziness, tingling, sensory change, weakness and headaches.  Endo/Heme/Allergies: Negative for polydipsia.  Psychiatric/Behavioral: Negative.   All other systems reviewed and are negative.   Physical Exam: BP 124/76   Pulse 66   Temp (!) 97.5 F (36.4 C)   Ht 5\' 6"  (1.676 m)   Wt 180 lb (81.6 kg)   SpO2 97%   BMI 29.05 kg/m  Wt Readings from Last 3 Encounters:  01/24/18 180 lb (81.6 kg)  10/01/17 179 lb 3.2 oz (81.3 kg)  07/11/17 181 lb (82.1 kg)   General Appearance: Well nourished, in no apparent distress. Eyes: PERRLA, EOMs, conjunctiva no swelling or erythema Sinuses: No Frontal/maxillary tenderness ENT/Mouth: Ext aud canals clear, TMs without erythema, bulging. No erythema,  swelling, or exudate on post pharynx.  Tonsils not swollen or erythematous. Hearing normal.  Neck: Supple, thyroid normal.  Respiratory: Respiratory effort normal, BS equal bilaterally without rales, rhonchi, wheezing or stridor.  Cardio: Heart sounds irregularly irregular without audible murmur. Brisk peripheral pulses without edema.  Abdomen: Soft, + BS.  Non tender, no guarding, rebound, hernias, masses. Lymphatics: Non tender without lymphadenopathy.  Musculoskeletal: Full ROM, 5/5 strength, Normal gait Skin: Warm, dry without rashes, lesions, ecchymosis.  Neuro: Cranial nerves intact. No cerebellar symptoms.  Psych: Awake and oriented X 3, normal affect, Insight and Judgment appropriate.    Dan Maker, NP 9:26 AM [facility]

## 2018-01-24 ENCOUNTER — Ambulatory Visit (INDEPENDENT_AMBULATORY_CARE_PROVIDER_SITE_OTHER): Payer: BLUE CROSS/BLUE SHIELD | Admitting: Adult Health

## 2018-01-24 ENCOUNTER — Encounter: Payer: Self-pay | Admitting: Adult Health

## 2018-01-24 VITALS — BP 124/76 | HR 66 | Temp 97.5°F | Ht 66.0 in | Wt 180.0 lb

## 2018-01-24 DIAGNOSIS — E663 Overweight: Secondary | ICD-10-CM | POA: Diagnosis not present

## 2018-01-24 DIAGNOSIS — I10 Essential (primary) hypertension: Secondary | ICD-10-CM

## 2018-01-24 DIAGNOSIS — E559 Vitamin D deficiency, unspecified: Secondary | ICD-10-CM

## 2018-01-24 DIAGNOSIS — I4891 Unspecified atrial fibrillation: Secondary | ICD-10-CM

## 2018-01-24 DIAGNOSIS — Z79899 Other long term (current) drug therapy: Secondary | ICD-10-CM | POA: Diagnosis not present

## 2018-01-24 DIAGNOSIS — E782 Mixed hyperlipidemia: Secondary | ICD-10-CM

## 2018-01-24 DIAGNOSIS — R7309 Other abnormal glucose: Secondary | ICD-10-CM

## 2018-01-24 NOTE — Patient Instructions (Addendum)
Plan your sugar splurges  Consider dark chocolate, fruit for something sweet   Know what a healthy weight is for you (roughly BMI <25) and aim to maintain this  Aim for 7+ servings of fruits and vegetables daily  65-80+ fluid ounces of water or unsweet tea for healthy kidneys  Limit to max 1 drink of alcohol per day; avoid smoking/tobacco  Limit animal fats in diet for cholesterol and heart health - choose grass fed whenever available  Avoid highly processed foods, and foods high in saturated/trans fats  Aim for low stress - take time to unwind and care for your mental health  Aim for 150 min of moderate intensity exercise weekly for heart health, and weights twice weekly for bone health  Aim for 7-9 hours of sleep daily      Drink 1/2 your body weight in fluid ounces of water daily; drink a tall glass of water 30 min before meals  Don't eat until you're stuffed- listen to your stomach and eat until you are 80% full   Try eating off of a salad plate; wait 10 min after finishing before going back for seconds  Start by eating the vegetables on your plate; aim for 02% of your meals to be fruits or vegetables  Then eat your protein - lean meats (grass fed if possible), fish, beans, nuts in moderation  Eat your carbs/starch last ONLY if you still are hungry. If you can, stop before finishing it all  Avoid sugar and flour - the closer it looks to it's original form in nature, typically the better it is for you  Splurge in moderation - "assign" days when you get to splurge and have the "bad stuff" - I like to follow a 80% - 20% plan- "good" choices 80 % of the time, "bad" choices in moderation 20% of the time  Simple equation is: Calories out > calories in = weight loss - even if you eat the bad stuff, if you limit portions, you will still lose weight       When it comes to diets, agreement about the perfect plan isn't easy to find, even among the experts. Experts at the  [facility] of [employer] developed an idea known as the Healthy Eating Plate. Just imagine a plate divided into logical, healthy portions.  The emphasis is on diet quality:  Load up on vegetables and fruits - one-half of your plate: Aim for color and variety, and remember that potatoes don't count.  Go for whole grains - one-quarter of your plate: Whole wheat, barley, wheat berries, quinoa, oats, brown rice, and foods made with them. If you want pasta, go with whole wheat pasta.  Protein power - one-quarter of your plate: Fish, chicken, beans, and nuts are all healthy, versatile protein sources. Limit red meat.  The diet, however, does go beyond the plate, offering a few other suggestions.  Use healthy plant oils, such as olive, canola, soy, corn, sunflower and peanut. Check the labels, and avoid partially hydrogenated oil, which have unhealthy trans fats.  If you're thirsty, drink water. Coffee and tea are good in moderation, but skip sugary drinks and limit milk and dairy products to one or two daily servings.  The type of carbohydrate in the diet is more important than the amount. Some sources of carbohydrates, such as vegetables, fruits, whole grains, and beans-are healthier than others.  Finally, stay active.

## 2018-01-25 LAB — CBC WITH DIFFERENTIAL/PLATELET
BASOS PCT: 0.4 %
Basophils Absolute: 27 cells/uL (ref 0–200)
EOS ABS: 88 {cells}/uL (ref 15–500)
Eosinophils Relative: 1.3 %
HCT: 41.8 % (ref 35.0–45.0)
HEMOGLOBIN: 14.1 g/dL (ref 11.7–15.5)
Lymphs Abs: 2251 cells/uL (ref 850–3900)
MCH: 32.2 pg (ref 27.0–33.0)
MCHC: 33.7 g/dL (ref 32.0–36.0)
MCV: 95.4 fL (ref 80.0–100.0)
MONOS PCT: 11.6 %
MPV: 10.9 fL (ref 7.5–12.5)
NEUTROS ABS: 3645 {cells}/uL (ref 1500–7800)
Neutrophils Relative %: 53.6 %
Platelets: 229 10*3/uL (ref 140–400)
RBC: 4.38 10*6/uL (ref 3.80–5.10)
RDW: 12.2 % (ref 11.0–15.0)
TOTAL LYMPHOCYTE: 33.1 %
WBC mixed population: 789 cells/uL (ref 200–950)
WBC: 6.8 10*3/uL (ref 3.8–10.8)

## 2018-01-25 LAB — COMPLETE METABOLIC PANEL WITH GFR
AG Ratio: 1.6 (calc) (ref 1.0–2.5)
ALBUMIN MSPROF: 4.6 g/dL (ref 3.6–5.1)
ALT: 15 U/L (ref 6–29)
AST: 20 U/L (ref 10–35)
Alkaline phosphatase (APISO): 114 U/L (ref 33–130)
BILIRUBIN TOTAL: 0.4 mg/dL (ref 0.2–1.2)
BUN / CREAT RATIO: 20 (calc) (ref 6–22)
BUN: 21 mg/dL (ref 7–25)
CHLORIDE: 107 mmol/L (ref 98–110)
CO2: 27 mmol/L (ref 20–32)
Calcium: 10.3 mg/dL (ref 8.6–10.4)
Creat: 1.07 mg/dL — ABNORMAL HIGH (ref 0.50–0.99)
GFR, EST AFRICAN AMERICAN: 64 mL/min/{1.73_m2} (ref >=60)
GFR, Est Non African American: 56 mL/min/{1.73_m2} — ABNORMAL LOW (ref >=60)
GLUCOSE: 91 mg/dL (ref 65–99)
Globulin: 2.8 g/dL (calc) (ref 1.9–3.7)
POTASSIUM: 5 mmol/L (ref 3.5–5.3)
SODIUM: 143 mmol/L (ref 135–146)
TOTAL PROTEIN: 7.4 g/dL (ref 6.1–8.1)

## 2018-01-25 LAB — LIPID PANEL
CHOLESTEROL: 167 mg/dL (ref <200)
HDL: 55 mg/dL (ref >50)
LDL CHOLESTEROL (CALC): 90 mg/dL
Non-HDL Cholesterol (Calc): 112 mg/dL (calc) (ref <130)
Total CHOL/HDL Ratio: 3 (calc) (ref <5.0)
Triglycerides: 120 mg/dL (ref <150)

## 2018-01-25 LAB — TSH: TSH: 1.66 m[IU]/L (ref 0.40–4.50)

## 2018-01-25 LAB — HEMOGLOBIN A1C
EAG (MMOL/L): 6.6 (calc)
HEMOGLOBIN A1C: 5.8 %{Hb} — AB (ref <5.7)
MEAN PLASMA GLUCOSE: 120 (calc)

## 2018-02-20 DIAGNOSIS — I5022 Chronic systolic (congestive) heart failure: Secondary | ICD-10-CM | POA: Diagnosis not present

## 2018-02-20 DIAGNOSIS — I481 Persistent atrial fibrillation: Secondary | ICD-10-CM | POA: Diagnosis not present

## 2018-02-20 DIAGNOSIS — I10 Essential (primary) hypertension: Secondary | ICD-10-CM | POA: Diagnosis not present

## 2018-02-20 DIAGNOSIS — E7849 Other hyperlipidemia: Secondary | ICD-10-CM | POA: Diagnosis not present

## 2018-03-27 ENCOUNTER — Other Ambulatory Visit: Payer: Self-pay | Admitting: Adult Health

## 2018-04-27 ENCOUNTER — Encounter: Payer: Self-pay | Admitting: Internal Medicine

## 2018-04-27 DIAGNOSIS — Z8249 Family history of ischemic heart disease and other diseases of the circulatory system: Secondary | ICD-10-CM | POA: Insufficient documentation

## 2018-04-27 DIAGNOSIS — R7303 Prediabetes: Secondary | ICD-10-CM | POA: Insufficient documentation

## 2018-04-27 DIAGNOSIS — I480 Paroxysmal atrial fibrillation: Secondary | ICD-10-CM | POA: Insufficient documentation

## 2018-04-27 NOTE — Progress Notes (Signed)
This very nice 62 y.o. MWF presents for 6 month follow up with HTN, pAfib, HLD, Pre-Diabetes and Vitamin D Deficiency.      Patient has know labile HTN since 2007 & BP has been controlled at home. In 2017, she had a Negative Heart Cath for eval of pAfib requiring CV and has been followed on Xarelto by Dr Algie Coffer.  Today's BP is at goal - 128/76. Patient has had no complaints of any cardiac type chest pain, palpitations, dyspnea / orthopnea / PND, dizziness, claudication, or dependent edema.     Hyperlipidemia is controlled with diet & Crestor. Patient denies myalgias or other med SE's. Last Lipids were at goal:  Lab Results  Component Value Date   CHOL 167 01/24/2018   HDL 55 01/24/2018   LDLCALC 90 01/24/2018   TRIG 120 01/24/2018   CHOLHDL 3.0 01/24/2018      Also, the patient is monitored expectantly for PreDiabetes and has had no symptoms of reactive hypoglycemia, diabetic polys, paresthesias or visual blurring.  Last A1c was not at goal: Lab Results  Component Value Date   HGBA1C 5.8 (H) 01/24/2018      Further, the patient also has history of Vitamin D Deficiency ("10" / 2008)  and supplements vitamin D without any suspected side-effects. Last vitamin D was at goal:  Lab Results  Component Value Date   VD25OH 60 10/01/2017   Current Outpatient Medications on File Prior to Visit  Medication Sig  . Cholecalciferol (VITAMIN D PO) Take 5,000 Units by mouth 2 (two) times daily.  Marland Kitchen diltiazem (CARDIZEM) 60 MG tablet TK 1 T PO  TID.  Marland Kitchen lisinopril (PRINIVIL,ZESTRIL) 5 MG tablet TK 1 PO Q D  . metoprolol (LOPRESSOR) 50 MG tablet Take 50 mg by mouth 2 (two) times daily.  . rivaroxaban (XARELTO) 20 MG TABS tablet Take 20 mg by mouth daily with supper.  . rosuvastatin (CRESTOR) 40 MG tablet Take 1 tablet daily for Cholesterol   No current facility-administered medications on file prior to visit.    Allergies  Allergen Reactions  . Codeine     REACTION: TREMORS/NERVOUS  .  Penicillins     REACTION: RASH/SWELLING   PMHx:   Past Medical History:  Diagnosis Date  . Atrial fibrillation (HCC) 03/22/2016   NEW ONSET   . Hyperlipidemia   . Hypertension   . Pre-diabetes   . Vitamin D deficiency    Immunization History  Administered Date(s) Administered  . PPD Test 08/05/2015, 09/05/2016, 10/01/2017  . Tdap 09/19/2010   Past Surgical History:  Procedure Laterality Date  . CARDIAC CATHETERIZATION N/A 03/23/2016   Procedure: Right/Left Heart Cath and Coronary Angiography;  Surgeon: Orpah Cobb, MD;  Location: [facility] INVASIVE CV LAB;  Service: Cardiovascular;  Laterality: N/A;  . CARDIOVERSION N/A 07/13/2016   Procedure: CARDIOVERSION;  Surgeon: Orpah Cobb, MD;  Location: [facility] ENDOSCOPY;  Service: Cardiovascular;  Laterality: N/A;  . CYST FROM WRIST Right   . TUBAL LIGATION     FHx:    Reviewed / unchanged  SHx:    Reviewed / unchanged   Systems Review:  Constitutional: Denies fever, chills, wt changes, headaches, insomnia, fatigue, night sweats, change in appetite. Eyes: Denies redness, blurred vision, diplopia, discharge, itchy, watery eyes.  ENT: Denies discharge, congestion, post nasal drip, epistaxis, sore throat, earache, hearing loss, dental pain, tinnitus, vertigo, sinus pain, snoring.  CV: Denies chest pain, palpitations, irregular heartbeat, syncope, dyspnea, diaphoresis, orthopnea, PND, claudication or edema. Respiratory: denies  cough, dyspnea, DOE, pleurisy, hoarseness, laryngitis, wheezing.  Gastrointestinal: Denies dysphagia, odynophagia, heartburn, reflux, water brash, abdominal pain or cramps, nausea, vomiting, bloating, diarrhea, constipation, hematemesis, melena, hematochezia  or hemorrhoids. Genitourinary: Denies dysuria, frequency, urgency, nocturia, hesitancy, discharge, hematuria or flank pain. Musculoskeletal: Denies arthralgias, myalgias, stiffness, jt. swelling, pain, limping or strain/sprain.  Skin: Denies pruritus, rash, hives, warts,  acne, eczema or change in skin lesion(s). Neuro: No weakness, tremor, incoordination, spasms, paresthesia or pain. Psychiatric: Denies confusion, memory loss or sensory loss. Endo: Denies change in weight, skin or hair change.  Heme/Lymph: No excessive bleeding, bruising or enlarged lymph nodes.  Physical Exam  BP 128/76   Pulse 64   Temp 97.6 F (36.4 C)   Resp 14   Ht 5\' 6"  (1.676 m)   Wt 182 lb 6.4 oz (82.7 kg)   SpO2 98%   BMI 29.44 kg/m   Appears  well nourished, well groomed  and in no distress.  Eyes: PERRLA, EOMs, conjunctiva no swelling or erythema. Sinuses: No frontal/maxillary tenderness ENT/Mouth: EAC's clear, TM's nl w/o erythema, bulging. Nares clear w/o erythema, swelling, exudates. Oropharynx clear without erythema or exudates. Oral hygiene is good. Tongue normal, non obstructing. Hearing intact.  Neck: Supple. Thyroid not palpable. Car 2+/2+ without bruits, nodes or JVD. Chest: Respirations nl with BS clear & equal w/o rales, rhonchi, wheezing or stridor.  Cor: Heart sounds normal w/ regular rate and rhythm without sig. murmurs, gallops, clicks or rubs. Peripheral pulses normal and equal  without edema.  Abdomen: Soft & bowel sounds normal. Non-tender w/o guarding, rebound, hernias, masses or organomegaly.  Lymphatics: Unremarkable.  Musculoskeletal: Full ROM all peripheral extremities, joint stability, 5/5 strength and normal gait.  Skin: Warm, dry without exposed rashes, lesions or ecchymosis apparent.  Neuro: Cranial nerves intact, reflexes equal bilaterally. Sensory-motor testing grossly intact. Tendon reflexes grossly intact.  Pysch: Alert & oriented x 3.  Insight and judgement nl & appropriate. No ideations.  Assessment and Plan:  1. Essential hypertension  - Continue medication, monitor blood pressure at home.  - Continue DASH diet.  Reminder to go to the ER if any CP,  SOB, nausea, dizziness, severe HA, changes vision/speech.  - CBC with  Differential/Platelet - COMPLETE METABOLIC PANEL WITH GFR - Magnesium - TSH  2. Hyperlipidemia, mixed  - Continue diet/meds, exercise,& lifestyle modifications.  - Continue monitor periodic cholesterol/liver & renal functions   - Lipid panel - TSH  3. Prediabetes  - Continue diet, exercise,  - lifestyle modifications.  - Monitor appropriate labs.  - Hemoglobin A1c - Insulin, random  4. Vitamin D deficiency  - Continue supplementation.  - VITAMIN D 25 Hydroxyl  5. Paroxysmal A-fib (HCC)  - TSH  6. Medication management  - CBC with Differential/Platelet - COMPLETE METABOLIC PANEL WITH GFR - Magnesium - Lipid panel - TSH - Hemoglobin A1c - Insulin, random - VITAMIN D 25 Hydroxyl       Discussed  regular exercise, BP monitoring, weight control to achieve/maintain BMI less than 25 and discussed med and SE's. Recommended labs to assess and monitor clinical status with further disposition pending results of labs. Over 30 minutes of exam, counseling, chart review was performed.

## 2018-04-27 NOTE — Patient Instructions (Signed)

## 2018-04-28 ENCOUNTER — Ambulatory Visit: Payer: BLUE CROSS/BLUE SHIELD | Admitting: Internal Medicine

## 2018-04-28 ENCOUNTER — Encounter: Payer: Self-pay | Admitting: Internal Medicine

## 2018-04-28 VITALS — BP 128/76 | HR 64 | Temp 97.6°F | Resp 14 | Ht 66.0 in | Wt 182.4 lb

## 2018-04-28 DIAGNOSIS — E782 Mixed hyperlipidemia: Secondary | ICD-10-CM | POA: Diagnosis not present

## 2018-04-28 DIAGNOSIS — E559 Vitamin D deficiency, unspecified: Secondary | ICD-10-CM

## 2018-04-28 DIAGNOSIS — I10 Essential (primary) hypertension: Secondary | ICD-10-CM | POA: Diagnosis not present

## 2018-04-28 DIAGNOSIS — Z79899 Other long term (current) drug therapy: Secondary | ICD-10-CM | POA: Diagnosis not present

## 2018-04-28 DIAGNOSIS — R7303 Prediabetes: Secondary | ICD-10-CM

## 2018-04-28 DIAGNOSIS — I480 Paroxysmal atrial fibrillation: Secondary | ICD-10-CM

## 2018-04-29 LAB — COMPLETE METABOLIC PANEL WITH GFR
AG RATIO: 1.5 (calc) (ref 1.0–2.5)
ALBUMIN MSPROF: 4.6 g/dL (ref 3.6–5.1)
ALT: 19 U/L (ref 6–29)
AST: 24 U/L (ref 10–35)
Alkaline phosphatase (APISO): 124 U/L (ref 33–130)
BILIRUBIN TOTAL: 0.4 mg/dL (ref 0.2–1.2)
BUN: 18 mg/dL (ref 7–25)
CHLORIDE: 102 mmol/L (ref 98–110)
CO2: 28 mmol/L (ref 20–32)
Calcium: 9.9 mg/dL (ref 8.6–10.4)
Creat: 0.94 mg/dL (ref 0.50–0.99)
GFR, EST AFRICAN AMERICAN: 75 mL/min/{1.73_m2} (ref >=60)
GFR, Est Non African American: 65 mL/min/{1.73_m2} (ref >=60)
GLOBULIN: 3.1 g/dL (ref 1.9–3.7)
Glucose, Bld: 95 mg/dL (ref 65–99)
Potassium: 4.1 mmol/L (ref 3.5–5.3)
Sodium: 142 mmol/L (ref 135–146)
Total Protein: 7.7 g/dL (ref 6.1–8.1)

## 2018-04-29 LAB — CBC WITH DIFFERENTIAL/PLATELET
Basophils Absolute: 41 cells/uL (ref 0–200)
Basophils Relative: 0.5 %
EOS PCT: 1.2 %
Eosinophils Absolute: 98 cells/uL (ref 15–500)
HCT: 43.6 % (ref 35.0–45.0)
Hemoglobin: 14.7 g/dL (ref 11.7–15.5)
Lymphs Abs: 2624 cells/uL (ref 850–3900)
MCH: 32.1 pg (ref 27.0–33.0)
MCHC: 33.7 g/dL (ref 32.0–36.0)
MCV: 95.2 fL (ref 80.0–100.0)
MONOS PCT: 8.5 %
MPV: 10.7 fL (ref 7.5–12.5)
Neutro Abs: 4740 cells/uL (ref 1500–7800)
Neutrophils Relative %: 57.8 %
PLATELETS: 238 10*3/uL (ref 140–400)
RBC: 4.58 10*6/uL (ref 3.80–5.10)
RDW: 12 % (ref 11.0–15.0)
TOTAL LYMPHOCYTE: 32 %
WBC: 8.2 10*3/uL (ref 3.8–10.8)
WBCMIX: 697 {cells}/uL (ref 200–950)

## 2018-04-29 LAB — VITAMIN D 25 HYDROXY (VIT D DEFICIENCY, FRACTURES): Vit D, 25-Hydroxy: 52 ng/mL (ref 30–100)

## 2018-04-29 LAB — MAGNESIUM: Magnesium: 2 mg/dL (ref 1.5–2.5)

## 2018-04-29 LAB — LIPID PANEL
Cholesterol: 215 mg/dL — ABNORMAL HIGH (ref <200)
HDL: 63 mg/dL (ref >50)
LDL Cholesterol (Calc): 124 mg/dL (calc) — ABNORMAL HIGH
NON-HDL CHOLESTEROL (CALC): 152 mg/dL — AB (ref <130)
Total CHOL/HDL Ratio: 3.4 (calc) (ref <5.0)
Triglycerides: 161 mg/dL — ABNORMAL HIGH (ref <150)

## 2018-04-29 LAB — HEMOGLOBIN A1C
EAG (MMOL/L): 6.5 (calc)
HEMOGLOBIN A1C: 5.7 %{Hb} — AB (ref <5.7)
MEAN PLASMA GLUCOSE: 117 (calc)

## 2018-04-29 LAB — TSH: TSH: 1.61 mIU/L (ref 0.40–4.50)

## 2018-04-29 LAB — INSULIN, RANDOM: Insulin: 7.8 u[IU]/mL (ref 2.0–19.6)

## 2018-07-29 NOTE — Progress Notes (Signed)
FOLLOW UP  Assessment and Plan:   Hypertension Well controlled with current medications  Monitor blood pressure at home; patient to call if consistently greater than 130/80 Continue DASH diet.   Reminder to go to the ER if any CP, SOB, nausea, dizziness, severe HA, changes vision/speech, left arm numbness and tingling and jaw pain.  Cholesterol Currently at goal; continue with statin  Continue low cholesterol diet and exercise.   Check lipid panel.   Other abnormal glucose Continue diet and exercise.  Perform daily foot/skin check, notify office of any concerning changes.  Check A1C  Overweight Long discussion about weight loss, diet, and exercise Recommended diet heavy in fruits and veggies and low in animal meats, cheeses, and dairy products, appropriate calorie intake Discussed ideal weight for height  and initial weight goal (175 lb) Patient will work on -sticking to new low carb diet, exercising, start walking when it get's warm Will follow up in 3 months  A fib with RVR Rate controlled at this time No concerns with excessive bleeding, no falls Continue medication: xarelto       Continue follow up with cardiology  Continue diet and meds as discussed. Further disposition pending results of labs. Discussed med's effects and SE's.   Over 30 minutes of exam, counseling, chart review, and critical decision making was performed.   Future Appointments  Date Time Provider Department Center  10/29/2018  9:00 AM Lucky Cowboy, MD [facility] None    ----------------------------------------------------------------------------------------------------------------------  HPI 63 y.o. female  presents for 3 month follow up on hypertension, cholesterol, hx of abnormal glucose, weight and vitamin D deficiency.   She has history of Afib, on xarelto, had cardioversion in Jan 2018, now treated by lopressor and diltiazem. EF was 40% via cath 03/2016. She follows with  cardiology.  BMI is Body mass index is 30.09 kg/m., she has been working on diet and exercise - following a low carb diet. She is trying to get up hourly and drink water. Walking at lunch breaks 20-30 min. Would like to get down to 165 lb this year.  Wt Readings from Last 3 Encounters:  07/30/18 186 lb 6.4 oz (84.6 kg)  04/28/18 182 lb 6.4 oz (82.7 kg)  01/24/18 180 lb (81.6 kg)   Her blood pressure has been controlled at home, today their BP is BP: 124/82  She does workout. She denies chest pain, shortness of breath, dizziness.   She is on cholesterol medication (rosuvastatin 40 mg daily) and denies myalgias. Her cholesterol is at goal. The cholesterol last visit was:   Lab Results  Component Value Date   CHOL 215 (H) 04/28/2018   HDL 63 04/28/2018   LDLCALC 124 (H) 04/28/2018   TRIG 161 (H) 04/28/2018   CHOLHDL 3.4 04/28/2018    She has been working on diet and exercise for glucose management, and denies increased appetite, nausea, paresthesia of the feet, polydipsia, polyuria, visual disturbances and vomiting. Last A1C in the office was:  Lab Results  Component Value Date   HGBA1C 5.7 (H) 04/28/2018   Patient is on Vitamin D supplement and near goal of 70 at recent check:   Lab Results  Component Value Date   VD25OH 52 04/28/2018        Current Medications:  Current Outpatient Medications on File Prior to Visit  Medication Sig  . Cholecalciferol (VITAMIN D PO) Take 5,000 Units by mouth 2 (two) times daily.  Marland Kitchen diltiazem (CARDIZEM) 60 MG tablet TK 1 T PO  TID.  Marland Kitchen  lisinopril (PRINIVIL,ZESTRIL) 5 MG tablet TK 1 PO Q D  . metoprolol (LOPRESSOR) 50 MG tablet Take 50 mg by mouth 2 (two) times daily.  . rivaroxaban (XARELTO) 20 MG TABS tablet Take 20 mg by mouth daily with supper.   No current facility-administered medications on file prior to visit.      Allergies:  Allergies  Allergen Reactions  . Codeine     REACTION: TREMORS/NERVOUS  . Penicillins     REACTION:  RASH/SWELLING     Medical History:  Past Medical History:  Diagnosis Date  . Atrial fibrillation (HCC) 03/22/2016   NEW ONSET   . Hyperlipidemia   . Hypertension   . Pre-diabetes   . Vitamin D deficiency    Family history- Reviewed and unchanged Social history- Reviewed and unchanged   Review of Systems:  Review of Systems  Constitutional: Negative for malaise/fatigue and weight loss.  HENT: Negative for hearing loss and tinnitus.   Eyes: Negative for blurred vision and double vision.  Respiratory: Negative for cough, shortness of breath and wheezing.   Cardiovascular: Negative for chest pain, palpitations, orthopnea, claudication and leg swelling.  Gastrointestinal: Negative for abdominal pain, blood in stool, constipation, diarrhea, heartburn, melena, nausea and vomiting.  Genitourinary: Negative.   Musculoskeletal: Negative for joint pain and myalgias.  Skin: Negative for rash.  Neurological: Negative for dizziness, tingling, sensory change, weakness and headaches.  Endo/Heme/Allergies: Negative for polydipsia.  Psychiatric/Behavioral: Negative.   All other systems reviewed and are negative.   Physical Exam: BP 124/82   Pulse 70   Temp (!) 97.3 F (36.3 C)   Ht 5\' 6"  (1.676 m)   Wt 186 lb 6.4 oz (84.6 kg)   SpO2 98%   BMI 30.09 kg/m  Wt Readings from Last 3 Encounters:  07/30/18 186 lb 6.4 oz (84.6 kg)  04/28/18 182 lb 6.4 oz (82.7 kg)  01/24/18 180 lb (81.6 kg)   General Appearance: Well nourished, in no apparent distress. Eyes: PERRLA, EOMs, conjunctiva no swelling or erythema Sinuses: No Frontal/maxillary tenderness ENT/Mouth: Ext aud canals clear, TMs without erythema, bulging. No erythema, swelling, or exudate on post pharynx.  Tonsils not swollen or erythematous. Hearing normal.  Neck: Supple, thyroid normal.  Respiratory: Respiratory effort normal, BS equal bilaterally without rales, rhonchi, wheezing or stridor.  Cardio: Heart sounds irregularly  irregular without audible murmur. Brisk peripheral pulses without edema.  Abdomen: Soft, + BS.  Non tender, no guarding, rebound, hernias, masses. Lymphatics: Non tender without lymphadenopathy.  Musculoskeletal: Full ROM, 5/5 strength, Normal gait Skin: Warm, dry without rashes, lesions, ecchymosis.  Neuro: Cranial nerves intact. No cerebellar symptoms.  Psych: Awake and oriented X 3, normal affect, Insight and Judgment appropriate.    Quentin Mulling, PA-C 11:50 AM [facility]

## 2018-07-30 ENCOUNTER — Ambulatory Visit: Payer: BLUE CROSS/BLUE SHIELD | Admitting: Physician Assistant

## 2018-07-30 ENCOUNTER — Other Ambulatory Visit: Payer: Self-pay

## 2018-07-30 ENCOUNTER — Encounter: Payer: Self-pay | Admitting: Physician Assistant

## 2018-07-30 ENCOUNTER — Ambulatory Visit: Payer: Self-pay | Admitting: Physician Assistant

## 2018-07-30 VITALS — BP 124/82 | HR 70 | Temp 97.3°F | Ht 66.0 in | Wt 186.4 lb

## 2018-07-30 DIAGNOSIS — I10 Essential (primary) hypertension: Secondary | ICD-10-CM

## 2018-07-30 DIAGNOSIS — Z79899 Other long term (current) drug therapy: Secondary | ICD-10-CM

## 2018-07-30 DIAGNOSIS — R7309 Other abnormal glucose: Secondary | ICD-10-CM

## 2018-07-30 DIAGNOSIS — I480 Paroxysmal atrial fibrillation: Secondary | ICD-10-CM

## 2018-07-30 DIAGNOSIS — E782 Mixed hyperlipidemia: Secondary | ICD-10-CM | POA: Diagnosis not present

## 2018-07-30 MED ORDER — ROSUVASTATIN CALCIUM 40 MG PO TABS: ORAL_TABLET | ORAL | 1 refills | Status: DC

## 2018-07-30 NOTE — Patient Instructions (Signed)
Your LDL could improve, it was 229 last time, ideally we want it under a 100, this is why your crestor was increased to daily.  Your LDL is the bad cholesterol that can lead to heart attack and stroke. To lower your number you can decrease your fatty foods, red meat, cheese, milk and increase fiber like whole grains and veggies. You can also add a fiber supplement like Citracel or Benefiber, these do not cause gas and bloating and are safe to use. Especially if you have a strong family history of heart disease or stroke or you have evidence of plaque on any imaging like a chest xray, we may discuss at your next office visit putting you on a medication to get your number below 100.    Google mindful eating and here are some tips and tricks below.   Rate your hunger before you eat on a scale of 1-10, try to eat closer to a 6 or higher. And if you are at below that, why are you eating? Slow down and listen to your body.

## 2018-07-31 LAB — CBC WITH DIFFERENTIAL/PLATELET
ABSOLUTE MONOCYTES: 676 {cells}/uL (ref 200–950)
BASOS ABS: 30 {cells}/uL (ref 0–200)
Basophils Relative: 0.4 %
EOS PCT: 1.5 %
Eosinophils Absolute: 114 cells/uL (ref 15–500)
HEMATOCRIT: 43.2 % (ref 35.0–45.0)
HEMOGLOBIN: 14.5 g/dL (ref 11.7–15.5)
LYMPHS ABS: 2736 {cells}/uL (ref 850–3900)
MCH: 31.8 pg (ref 27.0–33.0)
MCHC: 33.6 g/dL (ref 32.0–36.0)
MCV: 94.7 fL (ref 80.0–100.0)
MPV: 11 fL (ref 7.5–12.5)
Monocytes Relative: 8.9 %
NEUTROS ABS: 4043 {cells}/uL (ref 1500–7800)
Neutrophils Relative %: 53.2 %
Platelets: 225 10*3/uL (ref 140–400)
RBC: 4.56 10*6/uL (ref 3.80–5.10)
RDW: 12.5 % (ref 11.0–15.0)
Total Lymphocyte: 36 %
WBC: 7.6 10*3/uL (ref 3.8–10.8)

## 2018-07-31 LAB — COMPLETE METABOLIC PANEL WITH GFR
AG RATIO: 1.5 (calc) (ref 1.0–2.5)
ALBUMIN MSPROF: 4.4 g/dL (ref 3.6–5.1)
ALT: 23 U/L (ref 6–29)
AST: 29 U/L (ref 10–35)
Alkaline phosphatase (APISO): 111 U/L (ref 37–153)
BUN / CREAT RATIO: 16 (calc) (ref 6–22)
BUN: 16 mg/dL (ref 7–25)
CALCIUM: 10 mg/dL (ref 8.6–10.4)
CO2: 28 mmol/L (ref 20–32)
CREATININE: 1.02 mg/dL — AB (ref 0.50–0.99)
Chloride: 104 mmol/L (ref 98–110)
GFR, EST NON AFRICAN AMERICAN: 58 mL/min/{1.73_m2} — AB (ref >=60)
GFR, Est African American: 68 mL/min/{1.73_m2} (ref >=60)
Globulin: 2.9 g/dL (calc) (ref 1.9–3.7)
Glucose, Bld: 90 mg/dL (ref 65–99)
Potassium: 4.4 mmol/L (ref 3.5–5.3)
Sodium: 141 mmol/L (ref 135–146)
Total Bilirubin: 0.5 mg/dL (ref 0.2–1.2)
Total Protein: 7.3 g/dL (ref 6.1–8.1)

## 2018-07-31 LAB — LIPID PANEL
CHOL/HDL RATIO: 3.1 (calc) (ref <5.0)
Cholesterol: 163 mg/dL (ref <200)
HDL: 53 mg/dL (ref >=50)
LDL Cholesterol (Calc): 88 mg/dL (calc)
NON-HDL CHOLESTEROL (CALC): 110 mg/dL (ref <130)
Triglycerides: 121 mg/dL (ref <150)

## 2018-07-31 LAB — HEMOGLOBIN A1C
HEMOGLOBIN A1C: 5.9 %{Hb} — AB (ref <5.7)
Mean Plasma Glucose: 123 (calc)
eAG (mmol/L): 6.8 (calc)

## 2018-07-31 LAB — TSH: TSH: 1.53 mIU/L (ref 0.40–4.50)

## 2018-08-25 DIAGNOSIS — I5022 Chronic systolic (congestive) heart failure: Secondary | ICD-10-CM | POA: Diagnosis not present

## 2018-08-25 DIAGNOSIS — I10 Essential (primary) hypertension: Secondary | ICD-10-CM | POA: Diagnosis not present

## 2018-08-25 DIAGNOSIS — E7849 Other hyperlipidemia: Secondary | ICD-10-CM | POA: Diagnosis not present

## 2018-08-25 DIAGNOSIS — I4821 Permanent atrial fibrillation: Secondary | ICD-10-CM | POA: Diagnosis not present

## 2018-10-29 ENCOUNTER — Encounter: Payer: Self-pay | Admitting: Internal Medicine

## 2018-12-23 DIAGNOSIS — Z1231 Encounter for screening mammogram for malignant neoplasm of breast: Secondary | ICD-10-CM | POA: Diagnosis not present

## 2018-12-23 DIAGNOSIS — Z6830 Body mass index (BMI) 30.0-30.9, adult: Secondary | ICD-10-CM | POA: Diagnosis not present

## 2018-12-23 DIAGNOSIS — Z01419 Encounter for gynecological examination (general) (routine) without abnormal findings: Secondary | ICD-10-CM | POA: Diagnosis not present

## 2018-12-23 DIAGNOSIS — Z124 Encounter for screening for malignant neoplasm of cervix: Secondary | ICD-10-CM | POA: Diagnosis not present

## 2018-12-23 LAB — RESULTS CONSOLE HPV: CHL HPV: NEGATIVE

## 2018-12-23 LAB — HM PAP SMEAR

## 2018-12-30 DIAGNOSIS — I4821 Permanent atrial fibrillation: Secondary | ICD-10-CM | POA: Diagnosis not present

## 2018-12-30 DIAGNOSIS — I342 Nonrheumatic mitral (valve) stenosis: Secondary | ICD-10-CM | POA: Diagnosis not present

## 2018-12-30 DIAGNOSIS — I10 Essential (primary) hypertension: Secondary | ICD-10-CM | POA: Diagnosis not present

## 2018-12-30 DIAGNOSIS — I5022 Chronic systolic (congestive) heart failure: Secondary | ICD-10-CM | POA: Diagnosis not present

## 2018-12-30 DIAGNOSIS — E7849 Other hyperlipidemia: Secondary | ICD-10-CM | POA: Diagnosis not present

## 2018-12-30 DIAGNOSIS — I422 Other hypertrophic cardiomyopathy: Secondary | ICD-10-CM | POA: Diagnosis not present

## 2019-01-14 ENCOUNTER — Other Ambulatory Visit: Payer: Self-pay | Admitting: Physician Assistant

## 2019-01-29 DIAGNOSIS — I342 Nonrheumatic mitral (valve) stenosis: Secondary | ICD-10-CM | POA: Diagnosis not present

## 2019-01-29 DIAGNOSIS — I422 Other hypertrophic cardiomyopathy: Secondary | ICD-10-CM | POA: Diagnosis not present

## 2019-01-29 DIAGNOSIS — I4821 Permanent atrial fibrillation: Secondary | ICD-10-CM | POA: Diagnosis not present

## 2019-01-29 DIAGNOSIS — I5022 Chronic systolic (congestive) heart failure: Secondary | ICD-10-CM | POA: Diagnosis not present

## 2019-03-04 ENCOUNTER — Encounter: Payer: Self-pay | Admitting: Internal Medicine

## 2019-03-04 NOTE — Progress Notes (Signed)
Annual Screening/Preventative Visit & Comprehensive Evaluation &  Examination     This very nice 63 y.o. MWF  presents for a Screening /Preventative Visit & comprehensive evaluation and management of multiple medical co-morbidities.  Patient has been followed for HTN, pAfib, HLD, Prediabetes  and Vitamin D Deficiency.      Patient has hx/o labile HTN since 2007 and  in 2017, she had an episode of pAfib, was CV and had Ht Cath by Dr Algie Coffer & has been on Xarelto since. She has been on Patient's BP has been controlled at home and patient denies any cardiac symptoms as chest pain, palpitations, shortness of breath, dizziness or ankle swelling. Today's BP is at goal - 116/74.      Patient's hyperlipidemia is controlled with diet and medications. Patient denies myalgias or other medication SE's. Last lipids were at goal: Lab Results  Component Value Date   CHOL 163 07/30/2018   HDL 53 07/30/2018   LDLCALC 88 07/30/2018   TRIG 121 07/30/2018   CHOLHDL 3.1 07/30/2018      Patient is monitored expectantly for glucose intolerance and patient denies reactive hypoglycemic symptoms, visual blurring, diabetic polys or paresthesias. Last A1c was near goal: Lab Results  Component Value Date   HGBA1C 5.9 (H) 07/30/2018      Finally, patient has history of Vitamin D Deficiency ("10" / 2008)  and last Vitamin D was not at goal: Lab Results  Component Value Date   VD25OH 52 04/28/2018   Current Outpatient Medications on File Prior to Visit  Medication Sig  . Cholecalciferol (VITAMIN D PO) Take 5,000 Units by mouth daily.   Marland Kitchen diltiazem (CARDIZEM) 60 MG tablet TK 1 T PO  TID.  Marland Kitchen lisinopril (PRINIVIL,ZESTRIL) 5 MG tablet TK 1 PO Q D  . metoprolol (LOPRESSOR) 50 MG tablet Take 50 mg by mouth 2 (two) times daily.  . rivaroxaban (XARELTO) 20 MG TABS tablet Take 20 mg by mouth daily with supper.  . rosuvastatin (CRESTOR) 40 MG tablet Take 1 tablet Daily for Cholesterol   No current facility-administered  medications on file prior to visit.    Allergies  Allergen Reactions  . Codeine     REACTION: TREMORS/NERVOUS  . Penicillins     REACTION: RASH/SWELLING   Past Medical History:  Diagnosis Date  . Atrial fibrillation (HCC) 03/22/2016   NEW ONSET   . Hyperlipidemia   . Hypertension   . Pre-diabetes   . Vitamin D deficiency    Health Maintenance  Topic Date Due  . Hepatitis C Screening  Jun 21, 1955  . HIV Screening  04/11/1970  . MAMMOGRAM  07/18/2013  . PAP SMEAR-Modifier  07/04/2018  . INFLUENZA VACCINE  01/17/2019  . COLONOSCOPY  09/07/2019  . TETANUS/TDAP  09/18/2020   Immunization History  Administered Date(s) Administered  . PPD Test 08/05/2015, 09/05/2016, 10/01/2017  . Tdap 09/19/2010   Last Colon - Colonoscopy - 09/06/2009  - Dr Leone Payor - Recc f/U in 10 years - due Mar / Apr 2021  Last [facility] & Pap-  Dr Kittie Plater Office - 12/23/2018  Family History  Problem Relation Age of Onset  . COPD Father   . Heart disease Father   . Hypertension Father   . Hypertension Brother    Social History   Tobacco Use  . Smoking status: Never Smoker  . Smokeless tobacco: Never Used  Substance Use Topics  . Alcohol use: Yes    Alcohol/week: 0.0 standard drinks    Comment: social  .  Drug use: No    ROS Constitutional: Denies fever, chills, weight loss/gain, headaches, insomnia,  night sweats, and change in appetite. Does c/o fatigue. Eyes: Denies redness, blurred vision, diplopia, discharge, itchy, watery eyes.  ENT: Denies discharge, congestion, post nasal drip, epistaxis, sore throat, earache, hearing loss, dental pain, Tinnitus, Vertigo, Sinus pain, snoring.  Cardio: Denies chest pain, palpitations, irregular heartbeat, syncope, dyspnea, diaphoresis, orthopnea, PND, claudication, edema Respiratory: denies cough, dyspnea, DOE, pleurisy, hoarseness, laryngitis, wheezing.  Gastrointestinal: Denies dysphagia, heartburn, reflux, water brash, pain, cramps, nausea, vomiting,  bloating, diarrhea, constipation, hematemesis, melena, hematochezia, jaundice, hemorrhoids Genitourinary: Denies dysuria, frequency, urgency, nocturia, hesitancy, discharge, hematuria, flank pain Breast: Breast lumps, nipple discharge, bleeding.  Musculoskeletal: Denies arthralgia, myalgia, stiffness, Jt. Swelling, pain, limp, and strain/sprain. Denies falls. Skin: Denies puritis, rash, hives, warts, acne, eczema, changing in skin lesion Neuro: No weakness, tremor, incoordination, spasms, paresthesia, pain Psychiatric: Denies confusion, memory loss, sensory loss. Denies Depression. Endocrine: Denies change in weight, skin, hair change, nocturia, and paresthesia, diabetic polys, visual blurring, hyper / hypo glycemic episodes.  Heme/Lymph: No excessive bleeding, bruising, enlarged lymph nodes.  Physical Exam  BP 116/74   Pulse 76   Temp 97.6 F (36.4 C)   Resp 16   Ht 5\' 6"  (1.676 m)   Wt 189 lb 12.8 oz (86.1 kg)   BMI 30.63 kg/m   General Appearance: Well nourished, well groomed and in no apparent distress.  Eyes: PERRLA, EOMs, conjunctiva no swelling or erythema, normal fundi and vessels. Sinuses: No frontal/maxillary tenderness ENT/Mouth: EACs patent / TMs  nl. Nares clear without erythema, swelling, mucoid exudates. Oral hygiene is good. No erythema, swelling, or exudate. Tongue normal, non-obstructing. Tonsils not swollen or erythematous. Hearing normal.  Neck: Supple, thyroid not palpable. No bruits, nodes or JVD. Respiratory: Respiratory effort normal.  BS equal and clear bilateral without rales, rhonci, wheezing or stridor. Cardio: Heart sounds are normal with regular rate and rhythm and no murmurs, rubs or gallops. Peripheral pulses are normal and equal bilaterally without edema. No aortic or femoral bruits. Chest: symmetric with normal excursions and percussion. Breasts: Symmetric, without lumps, nipple discharge, retractions, or fibrocystic changes.  Abdomen: Flat, soft with  bowel sounds active. Nontender, no guarding, rebound, hernias, masses, or organomegaly.  Lymphatics: Non tender without lymphadenopathy.  Genitourinary:  Musculoskeletal: Full ROM all peripheral extremities, joint stability, 5/5 strength, and normal gait. Skin: Warm and dry without rashes, lesions, cyanosis, clubbing or  ecchymosis.  Neuro: Cranial nerves intact, reflexes equal bilaterally. Normal muscle tone, no cerebellar symptoms. Sensation intact.  Pysch: Alert and oriented X 3, normal affect, Insight and Judgment appropriate.   Assessment and Plan  1. Annual Preventative Screening Examination  2. Essential hypertension  - EKG 12-Lead - US, RETROPERITNL ABD,  LTD - Urinalysis, Routine w reflex microscopic - Microalbumin / creatinine urine ratio - COMPLETE METABOLIC PANEL WITH GFR - CBC with Differential/Platelet - Magnesium - TSH  3. Hyperlipidemia, mixed  - EKG 12-Lead - US, RETROPERITNL ABD,  LTD - Lipid panel - TSH  4. Abnormal glucose  - EKG 12-Lead - US, RETROPERITNL ABD,  LTD - Hemoglobin A1c - Insulin, random  5. Vitamin D deficiency  - VITAMIN D 25 Hydroxyl  6. Paroxysmal atrial fibrillation (HCC) - EKG 12-Lead - TSH  7. Screening examination for pulmonary tuberculosis  - TB Skin Test  8. Screening for colorectal cancer  - POC Hemoccult Bld/Stl   9. Screening for ischemic heart disease  - EKG 12-Lead  10. FHx: heart  disease  - EKG 12-Lead - [place], RETROPERITNL ABD,  LTD  11. Screening for AAA (aortic abdominal aneurysm)  - [place], RETROPERITNL ABD,  LTD  12. Fatigue, unspecified type  - Iron,Total/Total Iron Binding Cap - Vitamin B12  13. Medication management  - Urinalysis, Routine w reflex microscopic - Microalbumin / creatinine urine ratio - COMPLETE METABOLIC PANEL WITH GFR - CBC with Differential/Platelet - Magnesium - Lipid panel - TSH - Hemoglobin A1c - Insulin, random - VITAMIN D 25 Hydroxyl        Patient was counseled  in prudent diet to achieve/maintain BMI less than 25 for weight control, BP monitoring, regular exercise and medications. Discussed med's effects and SE's. Screening labs and tests as requested with regular follow-up as recommended. Over 40 minutes of exam, counseling, chart review and high complex critical decision making was performed.   Kirtland Bouchard, MD

## 2019-03-04 NOTE — Patient Instructions (Addendum)

## 2019-03-05 ENCOUNTER — Ambulatory Visit: Payer: BC Managed Care – PPO | Admitting: Internal Medicine

## 2019-03-05 ENCOUNTER — Other Ambulatory Visit: Payer: Self-pay

## 2019-03-05 VITALS — BP 116/74 | HR 76 | Temp 97.6°F | Resp 16 | Ht 66.0 in | Wt 189.8 lb

## 2019-03-05 DIAGNOSIS — E559 Vitamin D deficiency, unspecified: Secondary | ICD-10-CM | POA: Diagnosis not present

## 2019-03-05 DIAGNOSIS — Z131 Encounter for screening for diabetes mellitus: Secondary | ICD-10-CM

## 2019-03-05 DIAGNOSIS — Z13220 Encounter for screening for lipoid disorders: Secondary | ICD-10-CM

## 2019-03-05 DIAGNOSIS — I480 Paroxysmal atrial fibrillation: Secondary | ICD-10-CM

## 2019-03-05 DIAGNOSIS — Z136 Encounter for screening for cardiovascular disorders: Secondary | ICD-10-CM

## 2019-03-05 DIAGNOSIS — Z111 Encounter for screening for respiratory tuberculosis: Secondary | ICD-10-CM | POA: Diagnosis not present

## 2019-03-05 DIAGNOSIS — Z130 Encounter for screening for diseases of the blood and blood-forming organs and certain disorders involving the immune mechanism: Secondary | ICD-10-CM

## 2019-03-05 DIAGNOSIS — Z79899 Other long term (current) drug therapy: Secondary | ICD-10-CM

## 2019-03-05 DIAGNOSIS — Z1211 Encounter for screening for malignant neoplasm of colon: Secondary | ICD-10-CM

## 2019-03-05 DIAGNOSIS — Z0000 Encounter for general adult medical examination without abnormal findings: Secondary | ICD-10-CM | POA: Diagnosis not present

## 2019-03-05 DIAGNOSIS — Z8249 Family history of ischemic heart disease and other diseases of the circulatory system: Secondary | ICD-10-CM | POA: Diagnosis not present

## 2019-03-05 DIAGNOSIS — Z1389 Encounter for screening for other disorder: Secondary | ICD-10-CM

## 2019-03-05 DIAGNOSIS — Z0001 Encounter for general adult medical examination with abnormal findings: Secondary | ICD-10-CM

## 2019-03-05 DIAGNOSIS — R7309 Other abnormal glucose: Secondary | ICD-10-CM

## 2019-03-05 DIAGNOSIS — R5383 Other fatigue: Secondary | ICD-10-CM

## 2019-03-05 DIAGNOSIS — Z1329 Encounter for screening for other suspected endocrine disorder: Secondary | ICD-10-CM

## 2019-03-05 DIAGNOSIS — I10 Essential (primary) hypertension: Secondary | ICD-10-CM | POA: Diagnosis not present

## 2019-03-05 DIAGNOSIS — E782 Mixed hyperlipidemia: Secondary | ICD-10-CM

## 2019-03-06 LAB — URINALYSIS, ROUTINE W REFLEX MICROSCOPIC
Bilirubin Urine: NEGATIVE
Glucose, UA: NEGATIVE
Hgb urine dipstick: NEGATIVE
Ketones, ur: NEGATIVE
Leukocytes,Ua: NEGATIVE
Nitrite: NEGATIVE
Protein, ur: NEGATIVE
Specific Gravity, Urine: 1.018 (ref 1.001–1.03)
pH: <=5 (ref 5.0–8.0)

## 2019-03-06 LAB — CBC WITH DIFFERENTIAL/PLATELET
Absolute Monocytes: 521 cells/uL (ref 200–950)
Basophils Absolute: 19 cells/uL (ref 0–200)
Basophils Relative: 0.3 %
Eosinophils Absolute: 99 cells/uL (ref 15–500)
Eosinophils Relative: 1.6 %
HCT: 40 % (ref 35.0–45.0)
Hemoglobin: 13.7 g/dL (ref 11.7–15.5)
Lymphs Abs: 2027 cells/uL (ref 850–3900)
MCH: 32.2 pg (ref 27.0–33.0)
MCHC: 34.3 g/dL (ref 32.0–36.0)
MCV: 93.9 fL (ref 80.0–100.0)
MPV: 11.7 fL (ref 7.5–12.5)
Monocytes Relative: 8.4 %
Neutro Abs: 3534 cells/uL (ref 1500–7800)
Neutrophils Relative %: 57 %
Platelets: 211 10*3/uL (ref 140–400)
RBC: 4.26 10*6/uL (ref 3.80–5.10)
RDW: 13 % (ref 11.0–15.0)
Total Lymphocyte: 32.7 %
WBC: 6.2 10*3/uL (ref 3.8–10.8)

## 2019-03-06 LAB — COMPLETE METABOLIC PANEL WITH GFR
AG Ratio: 1.8 (calc) (ref 1.0–2.5)
ALT: 24 U/L (ref 6–29)
AST: 29 U/L (ref 10–35)
Albumin: 4.4 g/dL (ref 3.6–5.1)
Alkaline phosphatase (APISO): 114 U/L (ref 37–153)
BUN: 20 mg/dL (ref 7–25)
CO2: 27 mmol/L (ref 20–32)
Calcium: 9.8 mg/dL (ref 8.6–10.4)
Chloride: 106 mmol/L (ref 98–110)
Creat: 0.96 mg/dL (ref 0.50–0.99)
GFR, Est African American: 73 mL/min/{1.73_m2} (ref >=60)
GFR, Est Non African American: 63 mL/min/{1.73_m2} (ref >=60)
Globulin: 2.4 g/dL (calc) (ref 1.9–3.7)
Glucose, Bld: 101 mg/dL — ABNORMAL HIGH (ref 65–99)
Potassium: 4.6 mmol/L (ref 3.5–5.3)
Sodium: 141 mmol/L (ref 135–146)
Total Bilirubin: 0.4 mg/dL (ref 0.2–1.2)
Total Protein: 6.8 g/dL (ref 6.1–8.1)

## 2019-03-06 LAB — HEMOGLOBIN A1C
Hgb A1c MFr Bld: 5.8 % of total Hgb — ABNORMAL HIGH (ref <5.7)
Mean Plasma Glucose: 120 (calc)
eAG (mmol/L): 6.6 (calc)

## 2019-03-06 LAB — TSH: TSH: 1.74 mIU/L (ref 0.40–4.50)

## 2019-03-06 LAB — IRON, TOTAL/TOTAL IRON BINDING CAP
%SAT: 28 % (calc) (ref 16–45)
Iron: 92 ug/dL (ref 45–160)
TIBC: 332 mcg/dL (calc) (ref 250–450)

## 2019-03-06 LAB — INSULIN, RANDOM: Insulin: 32.1 u[IU]/mL — ABNORMAL HIGH

## 2019-03-06 LAB — MAGNESIUM: Magnesium: 2 mg/dL (ref 1.5–2.5)

## 2019-03-06 LAB — LIPID PANEL
Cholesterol: 153 mg/dL (ref <200)
HDL: 51 mg/dL (ref >=50)
LDL Cholesterol (Calc): 82 mg/dL (calc)
Non-HDL Cholesterol (Calc): 102 mg/dL (calc) (ref <130)
Total CHOL/HDL Ratio: 3 (calc) (ref <5.0)
Triglycerides: 104 mg/dL (ref <150)

## 2019-03-06 LAB — VITAMIN B12: Vitamin B-12: 678 pg/mL (ref 200–1100)

## 2019-03-06 LAB — MICROALBUMIN / CREATININE URINE RATIO
Creatinine, Urine: 129 mg/dL (ref 20–275)
Microalb Creat Ratio: 5 mcg/mg creat (ref <30)
Microalb, Ur: 0.6 mg/dL

## 2019-03-06 LAB — VITAMIN D 25 HYDROXY (VIT D DEFICIENCY, FRACTURES): Vit D, 25-Hydroxy: 50 ng/mL (ref 30–100)

## 2019-03-07 ENCOUNTER — Encounter: Payer: Self-pay | Admitting: Internal Medicine

## 2019-05-05 DIAGNOSIS — I4821 Permanent atrial fibrillation: Secondary | ICD-10-CM | POA: Diagnosis not present

## 2019-05-05 DIAGNOSIS — E7849 Other hyperlipidemia: Secondary | ICD-10-CM | POA: Diagnosis not present

## 2019-05-05 DIAGNOSIS — I5022 Chronic systolic (congestive) heart failure: Secondary | ICD-10-CM | POA: Diagnosis not present

## 2019-05-05 DIAGNOSIS — I10 Essential (primary) hypertension: Secondary | ICD-10-CM | POA: Diagnosis not present

## 2019-06-03 NOTE — Progress Notes (Signed)
FOLLOW UP  Assessment and Plan:   Hypertension Well controlled with current medications  Monitor blood pressure at home; patient to call if consistently greater than 130/80 Continue DASH diet.   Reminder to go to the ER if any CP, SOB, nausea, dizziness, severe HA, changes vision/speech, left arm numbness and tingling and jaw pain.  Cholesterol Currently at goal; continue with statin  Continue low cholesterol diet and exercise.   Check lipid panel.   Other abnormal glucose Continue diet and exercise.  Perform daily foot/skin check, notify office of any concerning changes.  Check A1C q47m; defer today; check CMP for serum glucose, monitor weight trends  Overweight Long discussion about weight loss, diet, and exercise Recommended diet heavy in fruits and veggies and low in animal meats, cheeses, and dairy products, appropriate calorie intake Discussed ideal weight for height and initial weight goal (165 lb) Patient will work on -sticking to new low carb diet, exercising, finding alternatives to walking she can do while weather is cold; video exercise programs suggested Will follow up in 3 months  Paroxysmal a. 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 Further disposition pending results of labs. Discussed med's effects and SE's.   Over 30 minutes of exam, counseling, chart review, and critical decision making was performed.   Future Appointments  Date Time Provider [place]  09/10/2019  9:30 AM Unk Pinto, MD [facility] None  03/31/2020  9:00 AM Unk Pinto, MD [facility] None     ----------------------------------------------------------------------------------------------------------------------  HPI 63 y.o. female  presents for 3 month follow up on hypertension, cholesterol, hx of abnormal glucose, weight and vitamin D deficiency.   BMI is Body mass index is 30.02 kg/m., she has been working on diet and exercise - following a low carb diet. She is trying to get up hourly and drink water. Walking at lunch breaks 20-30 min. Would like to get down to 165 lb this year.  Wt Readings from Last 3 Encounters:  06/08/19 186 lb (84.4 kg)  03/05/19 189 lb 12.8 oz (86.1 kg)  07/30/18 186 lb 6.4 oz (84.6 kg)   She has history of Afib with RVR, on xarelto, had cardioversion in Jan 2018, now treated by lopressor and diltiazem. EF was 40% via cath 03/2016. She follows with cardiology. No concerns with excess bleeding. Follows with Dr. Doylene Canard. She reports recently had ECHO - report requested today.  Her blood pressure has been controlled at home, today their BP is BP: 122/76  She does workout. She denies chest pain, shortness of breath, dizziness.   She is on cholesterol medication (rosuvastatin 40 mg daily) and denies myalgias. Her cholesterol is at goal. The cholesterol last visit was:   Lab Results  Component Value Date   CHOL 153 03/05/2019   HDL 51 03/05/2019   LDLCALC 82 03/05/2019   TRIG 104 03/05/2019   CHOLHDL 3.0 03/05/2019    She has been working on diet and exercise for glucose management, and denies increased appetite, nausea, paresthesia of the feet, polydipsia, polyuria, visual disturbances and vomiting. Last A1C in the office was:  Lab Results  Component Value Date   HGBA1C 5.8 (H) 03/05/2019   Patient is on Vitamin D supplement and near goal at recent check:  Lab Results  Component Value Date   VD25OH 50 03/05/2019        Current Medications:  Current Outpatient Medications on File Prior to Visit  Medication Sig  . Cholecalciferol (VITAMIN D  PO) Take 5,000 Units by mouth daily.   Marland Kitchen diltiazem (CARDIZEM) 60 MG tablet TK 1 T PO  TID.  Marland Kitchen lisinopril (PRINIVIL,ZESTRIL) 5 MG tablet 10 mg daily.   . metoprolol (LOPRESSOR) 50 MG tablet Take 50 mg by mouth 2 (two) times daily.  . rivaroxaban (XARELTO) 20 MG TABS tablet Take 20 mg by mouth daily with supper.  . rosuvastatin (CRESTOR) 40 MG tablet Take 1 tablet Daily for Cholesterol   No current facility-administered medications on file prior to visit.     Allergies:  Allergies  Allergen Reactions  . Codeine     REACTION: TREMORS/NERVOUS  . Penicillins     REACTION: RASH/SWELLING     Medical History:  Past Medical History:  Diagnosis Date  . Atrial fibrillation (HCC) 03/22/2016   NEW ONSET   . Hyperlipidemia   . Hypertension   . Pre-diabetes   . Vitamin D deficiency    Family history- Reviewed and unchanged Social history- Reviewed and unchanged   Review of Systems:  Review of Systems  Constitutional: Negative for malaise/fatigue and weight loss.  HENT: Negative for hearing loss and tinnitus.   Eyes: Negative for blurred vision and double vision.  Respiratory: Negative for cough, shortness of breath and wheezing.   Cardiovascular: Negative for chest pain, palpitations, orthopnea, claudication and leg swelling.  Gastrointestinal: Negative for abdominal pain, blood in stool, constipation, diarrhea, heartburn, melena, nausea and vomiting.  Genitourinary: Negative.   Musculoskeletal: Negative for joint pain and myalgias.  Skin: Negative for rash.  Neurological: Negative for dizziness, tingling, sensory change, weakness and headaches.  Endo/Heme/Allergies: Negative for polydipsia.  Psychiatric/Behavioral: Negative.   All other systems reviewed and are negative.   Physical Exam: BP 122/76   Pulse 79   Temp (!) 95.7 F (35.4 C)   Ht 5\' 6"  (1.676 m)   Wt 186 lb (84.4 kg)   SpO2 96%   BMI 30.02 kg/m  Wt Readings from Last 3 Encounters:  06/08/19 186 lb (84.4 kg)   03/05/19 189 lb 12.8 oz (86.1 kg)  07/30/18 186 lb 6.4 oz (84.6 kg)   General Appearance: Well nourished, in no apparent distress. Eyes: PERRLA, EOMs, conjunctiva no swelling or erythema Sinuses: No Frontal/maxillary tenderness ENT/Mouth: Ext aud canals clear, TMs without erythema, bulging. No erythema, swelling, or exudate on post pharynx.  Tonsils not swollen or erythematous. Hearing normal.  Neck: Supple, thyroid normal.  Respiratory: Respiratory effort normal, BS equal bilaterally without rales, rhonchi, wheezing or stridor.  Cardio: Heart sounds irregularly irregular without audible murmur. Brisk peripheral pulses without edema.  Abdomen: Soft, + BS.  Non tender, no guarding, rebound, hernias, masses. Lymphatics: Non tender without lymphadenopathy.  Musculoskeletal: Full ROM, 5/5 strength, Normal gait Skin: Warm, dry without rashes, lesions, ecchymosis.  Neuro: Cranial nerves intact. No cerebellar symptoms.  Psych: Awake and oriented X 3, normal affect, Insight and Judgment appropriate.    09/28/18, NP 8:55 AM [facility]

## 2019-06-08 ENCOUNTER — Ambulatory Visit: Payer: BC Managed Care – PPO | Admitting: Adult Health

## 2019-06-08 ENCOUNTER — Encounter: Payer: Self-pay | Admitting: Adult Health

## 2019-06-08 ENCOUNTER — Other Ambulatory Visit: Payer: Self-pay

## 2019-06-08 VITALS — BP 122/76 | HR 79 | Temp 95.7°F | Ht 66.0 in | Wt 186.0 lb

## 2019-06-08 DIAGNOSIS — Z79899 Other long term (current) drug therapy: Secondary | ICD-10-CM | POA: Diagnosis not present

## 2019-06-08 DIAGNOSIS — R7309 Other abnormal glucose: Secondary | ICD-10-CM

## 2019-06-08 DIAGNOSIS — E559 Vitamin D deficiency, unspecified: Secondary | ICD-10-CM | POA: Diagnosis not present

## 2019-06-08 DIAGNOSIS — I480 Paroxysmal atrial fibrillation: Secondary | ICD-10-CM

## 2019-06-08 DIAGNOSIS — I10 Essential (primary) hypertension: Secondary | ICD-10-CM

## 2019-06-08 DIAGNOSIS — E782 Mixed hyperlipidemia: Secondary | ICD-10-CM

## 2019-06-08 DIAGNOSIS — D6859 Other primary thrombophilia: Secondary | ICD-10-CM

## 2019-06-08 DIAGNOSIS — E663 Overweight: Secondary | ICD-10-CM | POA: Diagnosis not present

## 2019-06-08 NOTE — Patient Instructions (Addendum)
Goals    . HEMOGLOBIN A1C < 5.7    . Weight (lb) < 165 lb (74.8 kg)          Drink 1/2 your body weight in fluid ounces of water daily; drink a tall glass of water 30 min before meals  Don't eat until you're stuffed- listen to your stomach and eat until you are 80% full   Try eating off of a salad plate; wait 10 min after finishing before going back for seconds  Start by eating the vegetables on your plate; aim for 76% of your meals to be fruits or vegetables  Then eat your protein - lean meats (grass fed if possible), fish, beans, nuts in moderation  Eat your carbs/starch last ONLY if you still are hungry. If you can, stop before finishing it all  Avoid sugar and flour - the closer it looks to it's original form in nature, typically the better it is for you  Splurge in moderation - "assign" days when you get to splurge and have the "bad stuff" - I like to follow a 80% - 20% plan- "good" choices 80 % of the time, "bad" choices in moderation 20% of the time  Simple equation is: Calories out > calories in = weight loss - even if you eat the bad stuff, if you limit portions, you will still lose weight      8 Critical Weight-Loss Tips That Aren't Diet and Exercise  1. STARVE THE DISTRACTIONS  All too often when we eat, we're also multitasking: watching TV, answering emails, scrolling through social media. These habits are detrimental to having a strong, clear, healthy relationship with food, and they can hinder our ability to make dietary changes.  In order to truly focus on what you're eating, how much you're eating, why you're eating those specific foods and, most importantly, how those foods make you feel, you need to starve the distractions. That means when you eat, just eat. Focus on your food, the process it went through to end up on your plate, where it came from and how it nourishes you. With this technique, you're more likely to finish a meal feeling satiated.  2.   CONSIDER WHAT YOU'RE NOT WILLING TO DO  This might sound counterintuitive, but it can help provide a "why" when motivation is waning. Declare, in writing, what you are unwilling to do, for example "I am unwilling to be the old dad who cannot play sports with my children".  So consider what you're not willing to accept, write it down, and keep it at the ready.  3.  STOP LABELING FOOD "GOOD" AND "BAD"  You've probably heard someone say they ate something "bad." Maybe you've even said it yourself.  The trouble with 'bad' foods isn't that they'll send you to the grave after a bite or two. The trouble comes when we eat excessive portions of really calorie-dense foods meal after meal, day after day.  Instead of labeling foods as good or bad, think about which foods you can eat a lot of, and which ones you should just eat a little of. Then, plan ways to eat the foods you really like in portions that fit with your overall goals. A good example of this would be having a slice of pizza alongside a club salad with chicken breast, avocado and a bit of dressing. This is vastly different than 3 slices of pizza, 4 breadsticks with cheese sauce and half of a liter of regular  soda.  4.  BRUSH YOUR TEETH AFTER YOU EAT  Getting your mindset in order is important, but sometimes small habits can make a big difference. After eating, you still have the taste of food in their mouth, which often causes people to eat more even if they are full or engage in a nibble or two of dessert.  Brushing your teeth will remove the taste of food from your mouth, and the clean, minty freshness will serve as a cue that mealtime is over.  5.  FOCUS ON CROWDING NOT CUTTING  The most common first step during 'dieting' is to cut. We cut our portion sizes down, we cut out 'bad' foods, we cut out entire food groups. This act of cutting puts [place] and our minds into scarcity mode.  When something is off-limits, even if you're able to avoid it  for a while, you could end up bingeing on it later because you've gone so long without it. So, instead of cutting, focus on crowding. If you crowd your plate and fill it up with more foods like veggies and protein, it simply allows less room for the other stuff. In other words, shift your focus away from what you can't eat, and celebrate the foods that will help you reach your goals.  6.  TAKE TRACKING A STEP FURTHER  Track what you eat, when you ate it, how much you ate and how that food made you feel. Being completely honest with yourself and writing down every single thing that passes through your lips will help you start to notice that maybe you actually do snack, possibly take in more sugar than you thought, eat when you're bored rather than just hungry or maybe that you have a habit of snacking before bed while watching TV.  The difference from simply tracking your food intake is you're taking into account how food makes you feel, as well as what you're doing while you're eating. This is about becoming more mindful of what, when and why you eat.  7.  PRIORITIZE GOOD SLEEP  One of the strongest risk factors for being overweight is poor sleep. When you're feeling tired, you're more likely to choose unhealthy comfort foods and to skip your workout. Additionally, sleep deprivation may slow down your metabolism. [employer]! Therefore, sleeping 7-8 hours per night can help with weight loss without having to change your diet or increase your physical activity. And if you feel you snore and still wake up tired, talk with me about sleep apnea.  8.  SET ASIDE TIME TO DISCONNECT  Just get out there. Disconnect from the electronics and connect to the elements. Not only will this help reduce stress (a major factor in weight gain) by giving your mind a break from the constant stimulation we've all become so accustomed to, but it may also reprogram your brain to connect with yourself and what you're feeling.

## 2019-06-09 LAB — CBC WITH DIFFERENTIAL/PLATELET
Absolute Monocytes: 570 cells/uL (ref 200–950)
Basophils Absolute: 18 cells/uL (ref 0–200)
Basophils Relative: 0.3 %
Eosinophils Absolute: 120 cells/uL (ref 15–500)
Eosinophils Relative: 2 %
HCT: 42.2 % (ref 35.0–45.0)
Hemoglobin: 14.2 g/dL (ref 11.7–15.5)
Lymphs Abs: 2076 cells/uL (ref 850–3900)
MCH: 32.3 pg (ref 27.0–33.0)
MCHC: 33.6 g/dL (ref 32.0–36.0)
MCV: 95.9 fL (ref 80.0–100.0)
MPV: 11.4 fL (ref 7.5–12.5)
Monocytes Relative: 9.5 %
Neutro Abs: 3216 cells/uL (ref 1500–7800)
Neutrophils Relative %: 53.6 %
Platelets: 212 10*3/uL (ref 140–400)
RBC: 4.4 10*6/uL (ref 3.80–5.10)
RDW: 12.3 % (ref 11.0–15.0)
Total Lymphocyte: 34.6 %
WBC: 6 10*3/uL (ref 3.8–10.8)

## 2019-06-09 LAB — COMPLETE METABOLIC PANEL WITH GFR
AG Ratio: 1.5 (calc) (ref 1.0–2.5)
ALT: 19 U/L (ref 6–29)
AST: 24 U/L (ref 10–35)
Albumin: 4.4 g/dL (ref 3.6–5.1)
Alkaline phosphatase (APISO): 113 U/L (ref 37–153)
BUN/Creatinine Ratio: 17 (calc) (ref 6–22)
BUN: 19 mg/dL (ref 7–25)
CO2: 27 mmol/L (ref 20–32)
Calcium: 10.2 mg/dL (ref 8.6–10.4)
Chloride: 106 mmol/L (ref 98–110)
Creat: 1.11 mg/dL — ABNORMAL HIGH (ref 0.50–0.99)
GFR, Est African American: 61 mL/min/{1.73_m2} (ref >=60)
GFR, Est Non African American: 52 mL/min/{1.73_m2} — ABNORMAL LOW (ref >=60)
Globulin: 2.9 g/dL (calc) (ref 1.9–3.7)
Glucose, Bld: 101 mg/dL — ABNORMAL HIGH (ref 65–99)
Potassium: 4.5 mmol/L (ref 3.5–5.3)
Sodium: 142 mmol/L (ref 135–146)
Total Bilirubin: 0.4 mg/dL (ref 0.2–1.2)
Total Protein: 7.3 g/dL (ref 6.1–8.1)

## 2019-06-09 LAB — LIPID PANEL
Cholesterol: 176 mg/dL (ref <200)
HDL: 55 mg/dL (ref >=50)
LDL Cholesterol (Calc): 96 mg/dL (calc)
Non-HDL Cholesterol (Calc): 121 mg/dL (calc) (ref <130)
Total CHOL/HDL Ratio: 3.2 (calc) (ref <5.0)
Triglycerides: 152 mg/dL — ABNORMAL HIGH (ref <150)

## 2019-06-09 LAB — MAGNESIUM: Magnesium: 2 mg/dL (ref 1.5–2.5)

## 2019-06-09 LAB — TSH: TSH: 2.08 mIU/L (ref 0.40–4.50)

## 2019-08-05 DIAGNOSIS — I5022 Chronic systolic (congestive) heart failure: Secondary | ICD-10-CM | POA: Diagnosis not present

## 2019-08-05 DIAGNOSIS — I4821 Permanent atrial fibrillation: Secondary | ICD-10-CM | POA: Diagnosis not present

## 2019-08-05 DIAGNOSIS — E7849 Other hyperlipidemia: Secondary | ICD-10-CM | POA: Diagnosis not present

## 2019-08-05 DIAGNOSIS — I10 Essential (primary) hypertension: Secondary | ICD-10-CM | POA: Diagnosis not present

## 2019-08-17 ENCOUNTER — Other Ambulatory Visit: Payer: Self-pay | Admitting: *Deleted

## 2019-08-17 MED ORDER — ROSUVASTATIN CALCIUM 40 MG PO TABS: ORAL_TABLET | ORAL | 1 refills | Status: DC

## 2019-09-10 ENCOUNTER — Encounter: Payer: Self-pay | Admitting: Internal Medicine

## 2019-09-10 ENCOUNTER — Other Ambulatory Visit: Payer: Self-pay

## 2019-09-10 ENCOUNTER — Ambulatory Visit: Payer: BC Managed Care – PPO | Admitting: Internal Medicine

## 2019-09-10 VITALS — BP 114/74 | HR 64 | Temp 97.9°F | Resp 16 | Ht 66.0 in | Wt 186.0 lb

## 2019-09-10 DIAGNOSIS — Z79899 Other long term (current) drug therapy: Secondary | ICD-10-CM

## 2019-09-10 DIAGNOSIS — R7309 Other abnormal glucose: Secondary | ICD-10-CM | POA: Diagnosis not present

## 2019-09-10 DIAGNOSIS — I10 Essential (primary) hypertension: Secondary | ICD-10-CM

## 2019-09-10 DIAGNOSIS — E782 Mixed hyperlipidemia: Secondary | ICD-10-CM

## 2019-09-10 DIAGNOSIS — I480 Paroxysmal atrial fibrillation: Secondary | ICD-10-CM

## 2019-09-10 DIAGNOSIS — E559 Vitamin D deficiency, unspecified: Secondary | ICD-10-CM | POA: Diagnosis not present

## 2019-09-10 DIAGNOSIS — D6859 Other primary thrombophilia: Secondary | ICD-10-CM

## 2019-09-10 NOTE — Patient Instructions (Signed)

## 2019-09-10 NOTE — Progress Notes (Addendum)
.       History of Present Illness:       This very nice 64 y.o. MWF presents for 6 month follow up with HTN, HLD, Pre-Diabetes and Vitamin D Deficiency.       Patient is followed for labile  HTN 2007 & BP has been controlled at home.  In Jan 2018 , she had CV fo New pAfib w/RVR and heart Cath was negative (Dr Algie Coffer) . Today's BP is at goal - 114/74. Patient has had no complaints of any cardiac type chest pain, palpitations, dyspnea / orthopnea / PND, dizziness, claudication, or dependent edema.      Hyperlipidemia is controlled with diet & Rosuvastatin. Patient denies myalgias or other med SE's. Last Lipids were at goal:  Lab Results  Component Value Date   CHOL 176 06/08/2019   HDL 55 06/08/2019   LDLCALC 96 06/08/2019   TRIG 152 (H) 06/08/2019   CHOLHDL 3.2 06/08/2019    Also, the patient has history of PreDiabetes (A1c 5.9% / Apr 2019)  and has had no symptoms of reactive hypoglycemia, diabetic polys, paresthesias or visual blurring.  Last A1c was not at goal:  Lab Results  Component Value Date   HGBA1C 5.8 (H) 03/05/2019       Further, the patient also has history of Vitamin D Deficiency ("10" / 2008)  and supplements vitamin D without any suspected side-effects. Last vitamin D was not at goal:  Lab Results  Component Value Date   VD25OH 50 03/05/2019    Current Outpatient Medications on File Prior to Visit  Medication Sig  . Cholecalciferol (VITAMIN D PO) Take 5,000 Units by mouth daily.   Marland Kitchen diltiazem (CARDIZEM) 60 MG tablet TK 1 T PO  TID.  Marland Kitchen lisinopril (PRINIVIL,ZESTRIL) 5 MG tablet 10 mg daily.   . metoprolol (LOPRESSOR) 50 MG tablet Take 50 mg by mouth 2 (two) times daily.  . rivaroxaban (XARELTO) 20 MG TABS tablet Take 20 mg by mouth daily with supper.  . rosuvastatin (CRESTOR) 40 MG tablet Take 1 tablet Daily for Cholesterol   No current facility-administered medications on file prior to visit.    Allergies  Allergen Reactions  . Codeine     REACTION:  TREMORS/NERVOUS  . Penicillins     REACTION: RASH/SWELLING    PMHx:   Past Medical History:  Diagnosis Date  . Atrial fibrillation (HCC) 03/22/2016   NEW ONSET   . Hyperlipidemia   . Hypertension   . Pre-diabetes   . Vitamin D deficiency     Immunization History  Administered Date(s) Administered  . PPD Test 08/05/2015, 09/05/2016, 10/01/2017, 03/05/2019  . Tdap 09/19/2010    Past Surgical History:  Procedure Laterality Date  . CARDIAC CATHETERIZATION N/A 03/23/2016   Procedure: Right/Left Heart Cath and Coronary Angiography;  Surgeon: Orpah Cobb, MD;  Location: [facility];  Service: Cardiovascular;  Laterality: N/A;  . CARDIOVERSION N/A 07/13/2016   Procedure: CARDIOVERSION;  Surgeon: Orpah Cobb, MD;  Location: [facility] ENDOSCOPY;  Service: Cardiovascular;  Laterality: N/A;  . CYST FROM WRIST Right   . TUBAL LIGATION      FHx:    Reviewed / unchanged  SHx:    Reviewed / unchanged   Systems Review:  Constitutional: Denies fever, chills, wt changes, headaches, insomnia, fatigue, night sweats, change in appetite. Eyes: Denies redness, blurred vision, diplopia, discharge, itchy, watery eyes.  ENT: Denies discharge, congestion, post nasal drip, epistaxis, sore throat, earache, hearing loss, dental pain, tinnitus, vertigo,  sinus pain, snoring.  CV: Denies chest pain, palpitations, irregular heartbeat, syncope, dyspnea, diaphoresis, orthopnea, PND, claudication or edema. Respiratory: denies cough, dyspnea, DOE, pleurisy, hoarseness, laryngitis, wheezing.  Gastrointestinal: Denies dysphagia, odynophagia, heartburn, reflux, water brash, abdominal pain or cramps, nausea, vomiting, bloating, diarrhea, constipation, hematemesis, melena, hematochezia  or hemorrhoids. Genitourinary: Denies dysuria, frequency, urgency, nocturia, hesitancy, discharge, hematuria or flank pain. Musculoskeletal: Denies arthralgias, myalgias, stiffness, jt. swelling, pain, limping or strain/sprain.    Skin: Denies pruritus, rash, hives, warts, acne, eczema or change in skin lesion(s). Neuro: No weakness, tremor, incoordination, spasms, paresthesia or pain. Psychiatric: Denies confusion, memory loss or sensory loss. Endo: Denies change in weight, skin or hair change.  Heme/Lymph: No excessive bleeding, bruising or enlarged lymph nodes.  Physical Exam  BP 114/74   Pulse 64   Temp 97.9 F (36.6 C)   Resp 16   Ht 5\' 6"  (1.676 m)   Wt 186 lb (84.4 kg)   BMI 30.02 kg/m   Appears  well nourished, well groomed  and in no distress.  Eyes: PERRLA, EOMs, conjunctiva no swelling or erythema. Sinuses: No frontal/maxillary tenderness ENT/Mouth: EAC's clear, TM's nl w/o erythema, bulging. Nares clear w/o erythema, swelling, exudates. Oropharynx clear without erythema or exudates. Oral hygiene is good. Tongue normal, non obstructing. Hearing intact.  Neck: Supple. Thyroid not palpable. Car 2+/2+ without bruits, nodes or JVD. Chest: Respirations nl with BS clear & equal w/o rales, rhonchi, wheezing or stridor.  Cor: Heart sounds normal w/ regular rate and rhythm without sig. murmurs, gallops, clicks or rubs. Peripheral pulses normal and equal  without edema.  Abdomen: Soft & bowel sounds normal. Non-tender w/o guarding, rebound, hernias, masses or organomegaly.  Lymphatics: Unremarkable.  Musculoskeletal: Full ROM all peripheral extremities, joint stability, 5/5 strength and normal gait.  Skin: Warm, dry without exposed rashes, lesions or ecchymosis apparent.  Neuro: Cranial nerves intact, reflexes equal bilaterally. Sensory-motor testing grossly intact. Tendon reflexes grossly intact.  Pysch: Alert & oriented x 3.  Insight and judgement nl & appropriate. No ideations.  Assessment and Plan:  1. Essential hypertension  - Continue medication, monitor blood pressure at home.  - Continue DASH diet.  Reminder to go to the ER if any CP,  SOB, nausea, dizziness, severe HA, changes  vision/speech.  - CBC with Differential/Platelet - COMPLETE METABOLIC PANEL WITH GFR - Magnesium - TSH  2. Hyperlipidemia, mixed  - Continue diet/meds, exercise,& lifestyle modifications.  - Continue monitor periodic cholesterol/liver & renal functions   - Lipid panel - TSH  3. Abnormal glucose  - Continue diet, exercise  - Lifestyle modifications.  - Monitor appropriate labs.  - Hemoglobin A1c - Insulin, random  4. Vitamin D deficiency  - Continue supplementation.  - VITAMIN D 25 Hydroxy  5. Paroxysmal atrial fibrillation (HCC)  - TSH  6. Thrombophilia (HCC)  - CBC with Differential/Platelet  7. Medication management  - CBC with Differential/Platelet - COMPLETE METABOLIC PANEL WITH GFR - Magnesium - Lipid panel - TSH - Hemoglobin A1c - Insulin, random - VITAMIN D 25 Hydroxy        Discussed  regular exercise, BP monitoring, weight control to achieve/maintain BMI less than 25 and discussed med and SE's. Recommended labs to assess and monitor clinical status with further disposition pending results of labs.  I discussed the assessment and treatment plan with the patient. The patient was provided an opportunity to ask questions and all were answered. The patient agreed with the plan and demonstrated an understanding of  the instructions.  I provided over 30 minutes of exam, counseling, chart review and  complex critical decision making.   Kirtland Bouchard, MD

## 2019-09-11 LAB — CBC WITH DIFFERENTIAL/PLATELET
Absolute Monocytes: 610 cells/uL (ref 200–950)
Basophils Absolute: 23 cells/uL (ref 0–200)
Basophils Relative: 0.4 %
Eosinophils Absolute: 103 cells/uL (ref 15–500)
Eosinophils Relative: 1.8 %
HCT: 43.9 % (ref 35.0–45.0)
Hemoglobin: 14.9 g/dL (ref 11.7–15.5)
Lymphs Abs: 1910 cells/uL (ref 850–3900)
MCH: 32.3 pg (ref 27.0–33.0)
MCHC: 33.9 g/dL (ref 32.0–36.0)
MCV: 95.2 fL (ref 80.0–100.0)
MPV: 11.3 fL (ref 7.5–12.5)
Monocytes Relative: 10.7 %
Neutro Abs: 3055 cells/uL (ref 1500–7800)
Neutrophils Relative %: 53.6 %
Platelets: 216 10*3/uL (ref 140–400)
RBC: 4.61 10*6/uL (ref 3.80–5.10)
RDW: 12.9 % (ref 11.0–15.0)
Total Lymphocyte: 33.5 %
WBC: 5.7 10*3/uL (ref 3.8–10.8)

## 2019-09-11 LAB — COMPLETE METABOLIC PANEL WITH GFR
AG Ratio: 1.7 (calc) (ref 1.0–2.5)
ALT: 23 U/L (ref 6–29)
AST: 30 U/L (ref 10–35)
Albumin: 4.7 g/dL (ref 3.6–5.1)
Alkaline phosphatase (APISO): 102 U/L (ref 37–153)
BUN/Creatinine Ratio: 12 (calc) (ref 6–22)
BUN: 15 mg/dL (ref 7–25)
CO2: 25 mmol/L (ref 20–32)
Calcium: 10.5 mg/dL — ABNORMAL HIGH (ref 8.6–10.4)
Chloride: 107 mmol/L (ref 98–110)
Creat: 1.24 mg/dL — ABNORMAL HIGH (ref 0.50–0.99)
GFR, Est African American: 53 mL/min/{1.73_m2} — ABNORMAL LOW (ref >=60)
GFR, Est Non African American: 46 mL/min/{1.73_m2} — ABNORMAL LOW (ref >=60)
Globulin: 2.7 g/dL (calc) (ref 1.9–3.7)
Glucose, Bld: 100 mg/dL — ABNORMAL HIGH (ref 65–99)
Potassium: 4.8 mmol/L (ref 3.5–5.3)
Sodium: 141 mmol/L (ref 135–146)
Total Bilirubin: 0.5 mg/dL (ref 0.2–1.2)
Total Protein: 7.4 g/dL (ref 6.1–8.1)

## 2019-09-11 LAB — LIPID PANEL
Cholesterol: 165 mg/dL (ref <200)
HDL: 51 mg/dL (ref >=50)
LDL Cholesterol (Calc): 93 mg/dL (calc)
Non-HDL Cholesterol (Calc): 114 mg/dL (calc) (ref <130)
Total CHOL/HDL Ratio: 3.2 (calc) (ref <5.0)
Triglycerides: 111 mg/dL (ref <150)

## 2019-09-11 LAB — HEMOGLOBIN A1C
Hgb A1c MFr Bld: 5.9 % of total Hgb — ABNORMAL HIGH (ref <5.7)
Mean Plasma Glucose: 123 (calc)
eAG (mmol/L): 6.8 (calc)

## 2019-09-11 LAB — VITAMIN D 25 HYDROXY (VIT D DEFICIENCY, FRACTURES): Vit D, 25-Hydroxy: 61 ng/mL (ref 30–100)

## 2019-09-11 LAB — INSULIN, RANDOM: Insulin: 19.5 u[IU]/mL

## 2019-09-11 LAB — MAGNESIUM: Magnesium: 2.3 mg/dL (ref 1.5–2.5)

## 2019-09-11 LAB — TSH: TSH: 2.26 mIU/L (ref 0.40–4.50)

## 2019-10-16 ENCOUNTER — Ambulatory Visit: Payer: BC Managed Care – PPO | Attending: Internal Medicine

## 2019-10-16 ENCOUNTER — Other Ambulatory Visit: Payer: Self-pay

## 2019-10-16 DIAGNOSIS — U071 COVID-19: Secondary | ICD-10-CM | POA: Insufficient documentation

## 2019-10-16 DIAGNOSIS — Z20822 Contact with and (suspected) exposure to covid-19: Secondary | ICD-10-CM

## 2019-10-17 LAB — SARS-COV-2, NAA 2 DAY TAT

## 2019-10-17 LAB — NOVEL CORONAVIRUS, NAA: SARS-CoV-2, NAA: DETECTED — AB

## 2019-10-18 ENCOUNTER — Telehealth: Payer: Self-pay | Admitting: Physician Assistant

## 2019-10-18 NOTE — Telephone Encounter (Signed)
Called to discuss with [name] about Covid symptoms and the use of bamlanivimab/etesevimab or casirivimab/imdevimab, a monoclonal antibody infusion for those with mild to moderate Covid symptoms and at a high risk of hospitalization.     Pt is qualified for this infusion at the [facility] infusion center due to co-morbid conditions and/or a member of an at-risk group, however declines infusion at this time. Symptoms tier reviewed as well as criteria for ending isolation.  Symptoms reviewed that would warrant ED/Hospital evaluation. Preventative practices reviewed. Patient verbalized understanding. Patient advised to call back if he decides that he does want to get infusion. Callback number to the infusion center given. Patient advised to go to Urgent care or ED with severe symptoms. Last date pt would be eligible for infusion is 5/10.     Patient Active Problem List   Diagnosis Date Noted  . Paroxysmal atrial fibrillation (HCC) 04/27/2018  . FHx: heart disease 04/27/2018  . Overweight (BMI 25.0-29.9) 07/10/2017  . Essential hypertension 08/05/2015  . Hyperlipidemia, mixed 08/05/2015  . Abnormal glucose 08/05/2015  . Vitamin D deficiency 08/05/2015  . Medication management 08/05/2015    Cline Crock PA-C

## 2019-11-04 DIAGNOSIS — I4821 Permanent atrial fibrillation: Secondary | ICD-10-CM | POA: Diagnosis not present

## 2019-11-04 DIAGNOSIS — I10 Essential (primary) hypertension: Secondary | ICD-10-CM | POA: Diagnosis not present

## 2019-11-04 DIAGNOSIS — I5022 Chronic systolic (congestive) heart failure: Secondary | ICD-10-CM | POA: Diagnosis not present

## 2019-11-04 DIAGNOSIS — E7849 Other hyperlipidemia: Secondary | ICD-10-CM | POA: Diagnosis not present

## 2019-12-24 ENCOUNTER — Other Ambulatory Visit: Payer: Self-pay

## 2019-12-24 ENCOUNTER — Encounter: Payer: Self-pay | Admitting: Adult Health Nurse Practitioner

## 2019-12-24 ENCOUNTER — Ambulatory Visit: Payer: BC Managed Care – PPO | Admitting: Adult Health Nurse Practitioner

## 2019-12-24 VITALS — BP 112/72 | HR 93 | Temp 95.9°F | Ht 66.0 in | Wt 179.0 lb

## 2019-12-24 DIAGNOSIS — Z79899 Other long term (current) drug therapy: Secondary | ICD-10-CM

## 2019-12-24 DIAGNOSIS — I10 Essential (primary) hypertension: Secondary | ICD-10-CM | POA: Diagnosis not present

## 2019-12-24 DIAGNOSIS — E782 Mixed hyperlipidemia: Secondary | ICD-10-CM | POA: Diagnosis not present

## 2019-12-24 DIAGNOSIS — D6859 Other primary thrombophilia: Secondary | ICD-10-CM

## 2019-12-24 DIAGNOSIS — E663 Overweight: Secondary | ICD-10-CM

## 2019-12-24 DIAGNOSIS — E559 Vitamin D deficiency, unspecified: Secondary | ICD-10-CM | POA: Diagnosis not present

## 2019-12-24 DIAGNOSIS — I480 Paroxysmal atrial fibrillation: Secondary | ICD-10-CM

## 2019-12-24 DIAGNOSIS — Z1159 Encounter for screening for other viral diseases: Secondary | ICD-10-CM | POA: Diagnosis not present

## 2019-12-24 DIAGNOSIS — R7309 Other abnormal glucose: Secondary | ICD-10-CM
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
## 2019-12-25 LAB — COMPLETE METABOLIC PANEL WITH GFR
AG Ratio: 1.6 (calc) (ref 1.0–2.5)
ALT: 16 U/L (ref 6–29)
AST: 25 U/L (ref 10–35)
Albumin: 4.5 g/dL (ref 3.6–5.1)
Alkaline phosphatase (APISO): 101 U/L (ref 37–153)
BUN/Creatinine Ratio: 18 (calc) (ref 6–22)
BUN: 22 mg/dL (ref 7–25)
CO2: 29 mmol/L (ref 20–32)
Calcium: 10.6 mg/dL — ABNORMAL HIGH (ref 8.6–10.4)
Chloride: 104 mmol/L (ref 98–110)
Creat: 1.2 mg/dL — ABNORMAL HIGH (ref 0.50–0.99)
GFR, Est African American: 55 mL/min/{1.73_m2} — ABNORMAL LOW (ref >=60)
GFR, Est Non African American: 48 mL/min/{1.73_m2} — ABNORMAL LOW (ref >=60)
Globulin: 2.9 g/dL (calc) (ref 1.9–3.7)
Glucose, Bld: 119 mg/dL — ABNORMAL HIGH (ref 65–99)
Potassium: 4.2 mmol/L (ref 3.5–5.3)
Sodium: 142 mmol/L (ref 135–146)
Total Bilirubin: 0.5 mg/dL (ref 0.2–1.2)
Total Protein: 7.4 g/dL (ref 6.1–8.1)

## 2019-12-25 LAB — CBC WITH DIFFERENTIAL/PLATELET
Absolute Monocytes: 491 cells/uL (ref 200–950)
Basophils Absolute: 22 cells/uL (ref 0–200)
Basophils Relative: 0.4 %
Eosinophils Absolute: 70 cells/uL (ref 15–500)
Eosinophils Relative: 1.3 %
HCT: 43 % (ref 35.0–45.0)
Hemoglobin: 14.5 g/dL (ref 11.7–15.5)
Lymphs Abs: 2003 cells/uL (ref 850–3900)
MCH: 32.9 pg (ref 27.0–33.0)
MCHC: 33.7 g/dL (ref 32.0–36.0)
MCV: 97.5 fL (ref 80.0–100.0)
MPV: 11.7 fL (ref 7.5–12.5)
Monocytes Relative: 9.1 %
Neutro Abs: 2813 cells/uL (ref 1500–7800)
Neutrophils Relative %: 52.1 %
Platelets: 185 10*3/uL (ref 140–400)
RBC: 4.41 10*6/uL (ref 3.80–5.10)
RDW: 12.6 % (ref 11.0–15.0)
Total Lymphocyte: 37.1 %
WBC: 5.4 10*3/uL (ref 3.8–10.8)

## 2019-12-25 LAB — HEPATITIS C ANTIBODY
Hepatitis C Ab: NONREACTIVE
SIGNAL TO CUT-OFF: 0.01 (ref <1.00)

## 2019-12-25 LAB — HEMOGLOBIN A1C
Hgb A1c MFr Bld: 5.8 % of total Hgb — ABNORMAL HIGH (ref <5.7)
Mean Plasma Glucose: 120 (calc)
eAG (mmol/L): 6.6 (calc)

## 2019-12-25 LAB — LIPID PANEL
Cholesterol: 165 mg/dL (ref <200)
HDL: 57 mg/dL (ref >=50)
LDL Cholesterol (Calc): 89 mg/dL (calc)
Non-HDL Cholesterol (Calc): 108 mg/dL (calc) (ref <130)
Total CHOL/HDL Ratio: 2.9 (calc) (ref <5.0)
Triglycerides: 93 mg/dL (ref <150)

## 2020-01-28 DIAGNOSIS — Z1231 Encounter for screening mammogram for malignant neoplasm of breast: Secondary | ICD-10-CM | POA: Diagnosis not present

## 2020-01-28 DIAGNOSIS — Z01419 Encounter for gynecological examination (general) (routine) without abnormal findings: Secondary | ICD-10-CM | POA: Diagnosis not present

## 2020-01-28 DIAGNOSIS — Z6829 Body mass index (BMI) 29.0-29.9, adult: Secondary | ICD-10-CM | POA: Diagnosis not present

## 2020-02-03 ENCOUNTER — Other Ambulatory Visit: Payer: Self-pay | Admitting: Obstetrics and Gynecology

## 2020-02-03 DIAGNOSIS — E2839 Other primary ovarian failure: Secondary | ICD-10-CM

## 2020-02-05 ENCOUNTER — Other Ambulatory Visit: Payer: Self-pay

## 2020-02-05 ENCOUNTER — Ambulatory Visit
Admission: RE | Admit: 2020-02-05 | Discharge: 2020-02-05 | Disposition: A | Discharge status: Home / Self Care | Payer: BC Managed Care – PPO | Source: Ambulatory Visit | Attending: Obstetrics and Gynecology | Admitting: Obstetrics and Gynecology

## 2020-02-05 DIAGNOSIS — Z13820 Encounter for screening for osteoporosis: Secondary | ICD-10-CM | POA: Diagnosis not present

## 2020-02-05 DIAGNOSIS — Z780 Asymptomatic menopausal state: Secondary | ICD-10-CM | POA: Diagnosis not present

## 2020-02-05 DIAGNOSIS — E2839 Other primary ovarian failure: Secondary | ICD-10-CM

## 2020-02-08 DIAGNOSIS — I5022 Chronic systolic (congestive) heart failure: Secondary | ICD-10-CM | POA: Diagnosis not present

## 2020-02-08 DIAGNOSIS — I10 Essential (primary) hypertension: Secondary | ICD-10-CM | POA: Diagnosis not present

## 2020-02-08 DIAGNOSIS — I4821 Permanent atrial fibrillation: Secondary | ICD-10-CM | POA: Diagnosis not present

## 2020-02-08 DIAGNOSIS — E7849 Other hyperlipidemia: Secondary | ICD-10-CM | POA: Diagnosis not present

## 2020-03-30 ENCOUNTER — Encounter: Payer: Self-pay | Admitting: Internal Medicine

## 2020-03-30 NOTE — Progress Notes (Signed)
Annual Screening/Preventative Visit & Comprehensive Evaluation &  Examination     This very nice 64 y.o.  MWF presents for a Screening /Preventative Visit & comprehensive evaluation and management of multiple medical co-morbidities.  Patient has been followed for HTN, Parox Afib, HLD, Prediabetes  and Vitamin D Deficiency.      Labile HTN predates circa 2007. In 2017, she had an episode of Afib requiring CV and Dr Algie Coffer did Heart Cath . CHA2DS2-VASc =2.   Patient has been on Xarelto since. Patient's BP has been controlled at home and patient denies any cardiac symptoms as chest pain, palpitations, shortness of breath, dizziness or ankle swelling. Today's BP is at goal - 126/86.      Patient's hyperlipidemia is controlled with diet and Rosuvastatin. Patient denies myalgias or other medication SE's. Last lipids were at goal:  Lab Results  Component Value Date   CHOL 165 12/24/2019   HDL 57 12/24/2019   LDLCALC 89 12/24/2019   TRIG 93 12/24/2019   CHOLHDL 2.9 12/24/2019       Patient has hx/o prediabetes (A1c 5.9% /Feb 2020)  and patient denies reactive hypoglycemic symptoms, visual blurring, diabetic polys or paresthesias. Last A1c was not at goal:  Lab Results  Component Value Date   HGBA1C 5.8 (H) 12/24/2019       Finally, patient has history of Vitamin D Deficiency ("10" /2008)  and last Vitamin D was at goal:  Lab Results  Component Value Date   VD25OH 61 09/10/2019    Current Outpatient Medications on File Prior to Visit  Medication Sig  . VITAMIN D  Take 5,000 Units daily.   Marland Kitchen diltiazem 60 MG tablet Take  2  times daily.  Marland Kitchen lisinopril  10 MG tablet 10 mg daily.   . metoprolol  50 MG tablet Take  2 times daily.  Carlena Hurl 20 MG  Take  daily with supper.  . rosuvastatin  40 MG tablet Take 1 tablet Daily for Cholesterol    Allergies  Allergen Reactions  . Codeine REACTION: TREMORS/NERVOUS      . Penicillins REACTION: RASH/SWELLING       Past Medical History:    Diagnosis Date  . Atrial fibrillation (HCC) 03/22/2016   NEW ONSET   . Hyperlipidemia   . Hypertension   . Pre-diabetes   . Vitamin D deficiency    Health Maintenance  Topic Date Due  . HIV Screening  Never done  . MAMMOGRAM  07/18/2013  . PAP SMEAR-Modifier  07/04/2018  . COLONOSCOPY  09/07/2019  . INFLUENZA VACCINE  Never done  . TETANUS/TDAP  09/18/2020  . Hepatitis C Screening  Completed   Immunization History  Administered Date(s) Administered  . PPD Test 08/05/2015, 09/05/2016, 10/01/2017, 03/05/2019  . Tdap 09/19/2010    Last Colon - Colonoscopy - 09/06/2009  - Dr Leone Payor - Recc f/u in 10 years -  Dr Leone Payor sent reminder letter in Apr 2021 to schedule an OV to evaluate need for Colonoscopy (patient aware).   Last MGM  & dexaBMD - 01/28/2020  Past Surgical History:  Procedure Laterality Date  . CARDIAC CATHETERIZATION N/A 03/23/2016   Procedure: Right/Left Heart Cath and Coronary Angiography;  Surgeon: Orpah Cobb, MD;  Location: [facility];  Service: Cardiovascular;  Laterality: N/A;  . CARDIOVERSION N/A 07/13/2016   Procedure: CARDIOVERSION;  Surgeon: Orpah Cobb, MD;  Location: [facility]OSCOPY;  Service: Cardiovascular;  Laterality: N/A;  . CYST FROM WRIST Right   . TUBAL LIGATION  Family History  Problem Relation Age of Onset  . COPD Father   . Heart disease Father   . Hypertension Father   . Hypertension Brother    Social History   Tobacco Use  . Smoking status: Never Smoker  . Smokeless tobacco: Never Used  Substance Use Topics  . Alcohol use: Yes    Alcohol/week: 0.0 standard drinks    Comment: social  . Drug use: No    ROS Constitutional: Denies fever, chills, weight loss/gain, headaches, insomnia,  night sweats, and change in appetite. Does c/o fatigue. Eyes: Denies redness, blurred vision, diplopia, discharge, itchy, watery eyes.  ENT: Denies discharge, congestion, post nasal drip, epistaxis, sore throat, earache, hearing loss,  dental pain, Tinnitus, Vertigo, Sinus pain, snoring.  Cardio: Denies chest pain, palpitations, irregular heartbeat, syncope, dyspnea, diaphoresis, orthopnea, PND, claudication, edema Respiratory: denies cough, dyspnea, DOE, pleurisy, hoarseness, laryngitis, wheezing.  Gastrointestinal: Denies dysphagia, heartburn, reflux, water brash, pain, cramps, nausea, vomiting, bloating, diarrhea, constipation, hematemesis, melena, hematochezia, jaundice, hemorrhoids Genitourinary: Denies dysuria, frequency, urgency, nocturia, hesitancy, discharge, hematuria, flank pain Breast: Breast lumps, nipple discharge, bleeding.  Musculoskeletal: Denies arthralgia, myalgia, stiffness, Jt. Swelling, pain, limp, and strain/sprain. Denies falls. Skin: Denies puritis, rash, hives, warts, acne, eczema, changing in skin lesion Neuro: No weakness, tremor, incoordination, spasms, paresthesia, pain Psychiatric: Denies confusion, memory loss, sensory loss. Denies Depression. Endocrine: Denies change in weight, skin, hair change, nocturia, and paresthesia, diabetic polys, visual blurring, hyper / hypo glycemic episodes.  Heme/Lymph: No excessive bleeding, bruising, enlarged lymph nodes.  Physical Exam  BP 126/86   Pulse 84   Temp 97.9 F (36.6 C)   Resp 16   Ht 5\' 7"  (1.702 m)   Wt 183 lb (83 kg)   SpO2 97%   BMI 28.66 kg/m   General Appearance: Well nourished, well groomed and in no apparent distress.  Eyes: PERRLA, EOMs, conjunctiva no swelling or erythema, normal fundi and vessels. Sinuses: No frontal/maxillary tenderness ENT/Mouth: EACs patent / TMs  nl. Nares clear without erythema, swelling, mucoid exudates. Oral hygiene is good. No erythema, swelling, or exudate. Tongue normal, non-obstructing. Tonsils not swollen or erythematous. Hearing normal.  Neck: Supple, thyroid not palpable. No bruits, nodes or JVD. Respiratory: Respiratory effort normal.  BS equal and clear bilateral without rales, rhonci, wheezing or  stridor. Cardio: Heart sounds are normal with regular rate and rhythm and no murmurs, rubs or gallops. Peripheral pulses are normal and equal bilaterally without edema. No aortic or femoral bruits. Chest: symmetric with normal excursions and percussion. Breasts: Symmetric, without lumps, nipple discharge, retractions, or fibrocystic changes.  Abdomen: Flat, soft with bowel sounds active. Nontender, no guarding, rebound, hernias, masses, or organomegaly.  Lymphatics: Non tender without lymphadenopathy.  Musculoskeletal: Full ROM all peripheral extremities, joint stability, 5/5 strength, and normal gait. Skin: Warm and dry without rashes, lesions, cyanosis, clubbing or  ecchymosis.  Neuro: Cranial nerves intact, reflexes equal bilaterally. Normal muscle tone, no cerebellar symptoms. Sensation intact.  Pysch: Alert and oriented X 3, normal affect, Insight and Judgment appropriate.   Assessment and Plan  1. Annual Preventative Screening Examination   2. Essential hypertension  - EKG 12-Lead - , RETROPERITNL ABD,  LTD - Urinalysis, Routine w reflex microscopic - Microalbumin / creatinine urine ratio - CBC with Differential/Platelet - COMPLETE METABOLIC PANEL WITH GFR - Magnesium - TSH  3. Hyperlipidemia, mixed  - EKG 12-Lead - [place], RETROPERITNL ABD,  LTD - Lipid panel - TSH  4. Abnormal glucose  - EKG 12-Lead -  [place], RETROPERITNL ABD,  LTD - Hemoglobin A1c - Insulin, random  5. Vitamin D deficiency  - VITAMIN D 25 Hydroxy   6. Prediabetes  - EKG 12-Lead - [place], RETROPERITNL ABD,  LTD - Hemoglobin A1c - Insulin, random  7. Paroxysmal atrial fibrillation (HCC)  - EKG 12-Lead  8. Thrombophilia (HCC)  - CBC with Differential/Platelet  9. Screening examination for pulmonary tuberculosis  - TB Skin Test  10. Screening for ischemic heart disease  - EKG 12-Lead  11. FHx: heart disease  - EKG 12-Lead - [place], RETROPERITNL ABD,  LTD  12. Screening for AAA (aortic  abdominal aneurysm)  - [place], RETROPERITNL ABD,  LTD  13. Fatigue  - Iron,Total/Total Iron Binding Cap - Vitamin B12 - CBC with Differential/Platelet - TSH  14. Medication management  - Urinalysis, Routine w reflex microscopic - Microalbumin / creatinine urine ratio - CBC with Differential/Platelet - COMPLETE METABOLIC PANEL WITH GFR - Magnesium - Lipid panel - TSH - Hemoglobin A1c - Insulin, random - VITAMIN D 25 Hydroxy   15. Screening for colorectal cancer  - POC Hemoccult Bld/Stl           Patient was counseled in prudent diet to achieve/maintain BMI less than 25 for weight control, BP monitoring, regular exercise and medications. Discussed med's effects and SE's. Screening labs and tests as requested with regular follow-up as recommended. Over 40 minutes of exam, counseling, chart review and high complex critical decision making was performed.   Marinus Maw, MD

## 2020-03-30 NOTE — Patient Instructions (Signed)

## 2020-03-31 ENCOUNTER — Ambulatory Visit: Payer: BLUE CROSS/BLUE SHIELD | Admitting: Internal Medicine

## 2020-03-31 ENCOUNTER — Other Ambulatory Visit: Payer: Self-pay

## 2020-03-31 VITALS — BP 126/86 | HR 84 | Temp 97.9°F | Resp 16 | Ht 67.0 in | Wt 183.0 lb

## 2020-03-31 DIAGNOSIS — E559 Vitamin D deficiency, unspecified: Secondary | ICD-10-CM

## 2020-03-31 DIAGNOSIS — Z13220 Encounter for screening for lipoid disorders: Secondary | ICD-10-CM

## 2020-03-31 DIAGNOSIS — Z1211 Encounter for screening for malignant neoplasm of colon: Secondary | ICD-10-CM

## 2020-03-31 DIAGNOSIS — Z1329 Encounter for screening for other suspected endocrine disorder: Secondary | ICD-10-CM

## 2020-03-31 DIAGNOSIS — E782 Mixed hyperlipidemia: Secondary | ICD-10-CM

## 2020-03-31 DIAGNOSIS — Z130 Encounter for screening for diseases of the blood and blood-forming organs and certain disorders involving the immune mechanism: Secondary | ICD-10-CM

## 2020-03-31 DIAGNOSIS — R7303 Prediabetes: Secondary | ICD-10-CM

## 2020-03-31 DIAGNOSIS — I480 Paroxysmal atrial fibrillation: Secondary | ICD-10-CM | POA: Diagnosis not present

## 2020-03-31 DIAGNOSIS — Z79899 Other long term (current) drug therapy: Secondary | ICD-10-CM | POA: Diagnosis not present

## 2020-03-31 DIAGNOSIS — Z136 Encounter for screening for cardiovascular disorders: Secondary | ICD-10-CM

## 2020-03-31 DIAGNOSIS — D6859 Other primary thrombophilia: Secondary | ICD-10-CM

## 2020-03-31 DIAGNOSIS — Z131 Encounter for screening for diabetes mellitus: Secondary | ICD-10-CM

## 2020-03-31 DIAGNOSIS — Z0000 Encounter for general adult medical examination without abnormal findings: Secondary | ICD-10-CM | POA: Diagnosis not present

## 2020-03-31 DIAGNOSIS — Z1389 Encounter for screening for other disorder: Secondary | ICD-10-CM

## 2020-03-31 DIAGNOSIS — Z0001 Encounter for general adult medical examination with abnormal findings: Secondary | ICD-10-CM

## 2020-03-31 DIAGNOSIS — Z111 Encounter for screening for respiratory tuberculosis: Secondary | ICD-10-CM

## 2020-03-31 DIAGNOSIS — Z8249 Family history of ischemic heart disease and other diseases of the circulatory system: Secondary | ICD-10-CM | POA: Diagnosis not present

## 2020-03-31 DIAGNOSIS — I10 Essential (primary) hypertension: Secondary | ICD-10-CM

## 2020-03-31 DIAGNOSIS — R5383 Other fatigue: Secondary | ICD-10-CM

## 2020-03-31 DIAGNOSIS — R7309 Other abnormal glucose: Secondary | ICD-10-CM

## 2020-03-31 DIAGNOSIS — Z1212 Encounter for screening for malignant neoplasm of rectum: Secondary | ICD-10-CM

## 2020-04-01 LAB — MICROALBUMIN / CREATININE URINE RATIO
Creatinine, Urine: 217 mg/dL (ref 20–275)
Microalb Creat Ratio: 11 mcg/mg creat (ref <30)
Microalb, Ur: 2.4 mg/dL

## 2020-04-01 LAB — COMPLETE METABOLIC PANEL WITH GFR
AG Ratio: 1.6 (calc) (ref 1.0–2.5)
ALT: 24 U/L (ref 6–29)
AST: 31 U/L (ref 10–35)
Albumin: 4.5 g/dL (ref 3.6–5.1)
Alkaline phosphatase (APISO): 119 U/L (ref 37–153)
BUN/Creatinine Ratio: 15 (calc) (ref 6–22)
BUN: 16 mg/dL (ref 7–25)
CO2: 27 mmol/L (ref 20–32)
Calcium: 10.1 mg/dL (ref 8.6–10.4)
Chloride: 106 mmol/L (ref 98–110)
Creat: 1.04 mg/dL — ABNORMAL HIGH (ref 0.50–0.99)
GFR, Est African American: 66 mL/min/{1.73_m2} (ref >=60)
GFR, Est Non African American: 57 mL/min/{1.73_m2} — ABNORMAL LOW (ref >=60)
Globulin: 2.8 g/dL (calc) (ref 1.9–3.7)
Glucose, Bld: 99 mg/dL (ref 65–99)
Potassium: 4.4 mmol/L (ref 3.5–5.3)
Sodium: 143 mmol/L (ref 135–146)
Total Bilirubin: 0.6 mg/dL (ref 0.2–1.2)
Total Protein: 7.3 g/dL (ref 6.1–8.1)

## 2020-04-01 LAB — LIPID PANEL
Cholesterol: 166 mg/dL (ref <200)
HDL: 53 mg/dL (ref >=50)
LDL Cholesterol (Calc): 92 mg/dL (calc)
Non-HDL Cholesterol (Calc): 113 mg/dL (calc) (ref <130)
Total CHOL/HDL Ratio: 3.1 (calc) (ref <5.0)
Triglycerides: 111 mg/dL (ref <150)

## 2020-04-01 LAB — CBC WITH DIFFERENTIAL/PLATELET
Absolute Monocytes: 678 cells/uL (ref 200–950)
Basophils Absolute: 32 cells/uL (ref 0–200)
Basophils Relative: 0.5 %
Eosinophils Absolute: 90 cells/uL (ref 15–500)
Eosinophils Relative: 1.4 %
HCT: 41.8 % (ref 35.0–45.0)
Hemoglobin: 14.6 g/dL (ref 11.7–15.5)
Lymphs Abs: 1888 cells/uL (ref 850–3900)
MCH: 33.9 pg — ABNORMAL HIGH (ref 27.0–33.0)
MCHC: 34.9 g/dL (ref 32.0–36.0)
MCV: 97 fL (ref 80.0–100.0)
MPV: 11.1 fL (ref 7.5–12.5)
Monocytes Relative: 10.6 %
Neutro Abs: 3712 cells/uL (ref 1500–7800)
Neutrophils Relative %: 58 %
Platelets: 185 10*3/uL (ref 140–400)
RBC: 4.31 10*6/uL (ref 3.80–5.10)
RDW: 12.1 % (ref 11.0–15.0)
Total Lymphocyte: 29.5 %
WBC: 6.4 10*3/uL (ref 3.8–10.8)

## 2020-04-01 LAB — URINALYSIS, ROUTINE W REFLEX MICROSCOPIC
Bacteria, UA: NONE SEEN /HPF
Bilirubin Urine: NEGATIVE
Glucose, UA: NEGATIVE
Hgb urine dipstick: NEGATIVE
Hyaline Cast: NONE SEEN /LPF
Nitrite: NEGATIVE
Protein, ur: NEGATIVE
Specific Gravity, Urine: 1.021 (ref 1.001–1.03)
pH: 5.5 (ref 5.0–8.0)

## 2020-04-01 LAB — VITAMIN D 25 HYDROXY (VIT D DEFICIENCY, FRACTURES): Vit D, 25-Hydroxy: 53 ng/mL (ref 30–100)

## 2020-04-01 LAB — IRON, TOTAL/TOTAL IRON BINDING CAP
%SAT: 25 % (calc) (ref 16–45)
Iron: 87 ug/dL (ref 45–160)
TIBC: 354 mcg/dL (calc) (ref 250–450)

## 2020-04-01 LAB — TSH: TSH: 2.37 mIU/L (ref 0.40–4.50)

## 2020-04-01 LAB — MAGNESIUM: Magnesium: 2.1 mg/dL (ref 1.5–2.5)

## 2020-04-01 LAB — VITAMIN B12: Vitamin B-12: 754 pg/mL (ref 200–1100)

## 2020-04-01 LAB — HEMOGLOBIN A1C
Hgb A1c MFr Bld: 6 % of total Hgb — ABNORMAL HIGH (ref <5.7)
Mean Plasma Glucose: 126 (calc)
eAG (mmol/L): 7 (calc)

## 2020-04-01 LAB — INSULIN, RANDOM: Insulin: 11 u[IU]/mL

## 2020-04-01 NOTE — Progress Notes (Signed)
========================================================== - Test results slightly outside the reference range are not unusual. If there is anything important, I will review this with you,  otherwise it is considered normal test values.  If you have further questions,  please do not hesitate to contact me at the office or via My Chart.  ==========================================================  - U/A shows some WBC, but Nitrate test is Negative, So not likely infection ==========================================================  -  Iron & Vitamin B12 levels - Both Normal & OK  ==========================================================  -  Kidney functions (GFR) still CKD = Chronic Kidney Disease  - Stage 3a & stable over the last 3-4 years  ==========================================================  -  Total Chol = 166 and LDL Chol = 92 - Both  Excellent   - Very low risk for Heart Attack  / Stroke ==========================================================  - A1c = 6.0%   Blood sugar and A1c are STILL elevated in the borderline and  early or pre-diabetes range which has the same   300% increased risk for heart attack, stroke, cancer and   alzheimer- type vascular dementia as full blown diabetes.   But the good news is that diet, exercise with  weight loss can cure the early diabetes at this point. ..........................................................................................................  -   It is very important that you work harder with diet by  avoiding all foods that are white except chicken,   fish & calliflower.  - Avoid white rice  (brown & wild rice is OK),   - Avoid white potatoes  (sweet potatoes in moderation is OK),   White bread or wheat bread or anything made out of   white flour like bagels, donuts, rolls, buns, biscuits, cakes,  - pastries, cookies, pizza crust, and pasta (made from  white flour & egg whites)   - vegetarian pasta or  spinach or wheat pasta is OK.  - Multigrain breads like Arnold's, Pepperidge Farm or   multigrain sandwich thins or high fiber breads like   [place] bread or "Dave's Killer" breads that are  4 to 5 grams fiber per slice !  are best.    Diet, exercise and weight loss can reverse and cure  diabetes in the early stages.   ==========================================================  -  Vitamin D = 53 , sl low   - Vitamin D goal is between 70-100.   %%%%%%%%%%%%%%%%%%%%%%%%%%%%%%%%%%%%%%  - So if taking Vitamin D 5,000 unit capsule  /day - then please  increase to 2 capsules = 10,000 units /day   %%%%%%%%%%%%%%%%%%%%%%%%%%%%%%%%%%%%%%  - It is very important as a natural anti-inflammatory and helping the  immune system protect against viral infections, like the Covid-19    helping hair, skin, and nails, as well as reducing stroke and  heart attack risk.   - It helps your bones and helps with mood.  - It also decreases numerous cancer risks so please  take it as directed.   - Low Vit D is associated with a 200-300% higher risk for  CANCER   and 200-300% higher risk for HEART   ATTACK  &  STROKE.    - It is also associated with higher death rate at younger ages,   autoimmune diseases like Rheumatoid arthritis, Lupus,  Multiple Sclerosis.     - Also many other serious conditions, like depression, Alzheimer's  Dementia, infertility, muscle aches, fatigue, fibromyalgia   - just to name a few. =======================================================  - All Else - CBC - Electrolytes - Liver - Magnesium & Thyroid    - all  Normal / OK ====================================================

## 2020-04-04 ENCOUNTER — Other Ambulatory Visit: Payer: Self-pay | Admitting: *Deleted

## 2020-04-04 MED ORDER — ROSUVASTATIN CALCIUM 40 MG PO TABS: ORAL_TABLET | ORAL | 1 refills | Status: DC

## 2020-05-10 DIAGNOSIS — I4821 Permanent atrial fibrillation: Secondary | ICD-10-CM | POA: Diagnosis not present

## 2020-05-10 DIAGNOSIS — I361 Nonrheumatic tricuspid (valve) insufficiency: Secondary | ICD-10-CM | POA: Diagnosis not present

## 2020-05-10 DIAGNOSIS — I342 Nonrheumatic mitral (valve) stenosis: Secondary | ICD-10-CM | POA: Diagnosis not present

## 2020-05-10 DIAGNOSIS — I5022 Chronic systolic (congestive) heart failure: Secondary | ICD-10-CM | POA: Diagnosis not present

## 2020-07-06 ENCOUNTER — Ambulatory Visit: Payer: BC Managed Care – PPO | Admitting: Adult Health

## 2020-07-06 ENCOUNTER — Other Ambulatory Visit: Payer: Self-pay | Admitting: Internal Medicine

## 2020-07-06 DIAGNOSIS — N1831 Chronic kidney disease, stage 3a: Secondary | ICD-10-CM | POA: Insufficient documentation

## 2020-07-06 NOTE — Progress Notes (Deleted)
FOLLOW UP 3 MONTH  Assessment and Plan:   Hypertension Well controlled with current medications  Monitor blood pressure at home; patient to call if consistently greater than 130/80 Continue DASH diet.   Reminder to go to the ER if any CP, SOB, nausea, dizziness, severe HA, changes vision/speech, left arm numbness and tingling and jaw pain.  Hyperlipademia Currently at goal; continue with statin  Continue low cholesterol diet and exercise.   Check lipid panel.   Other abnormal glucose (prediabetes)  Continue diet and exercise.  Perform daily foot/skin check, notify office of any concerning changes.  Check A1C q48m; defer today; check CMP for serum glucose, monitor weight trends  Overweight BMI 28.89 (25.0 - 29.0) Long discussion about weight loss, diet, and exercise Recommended diet heavy in fruits and veggies and low in animal meats, cheeses, and dairy products, appropriate calorie intake Discussed ideal weight for height and initial weight goal (165 lb) Patient will work on -sticking to new low carb diet, exercising, finding alternatives to walking she can do while weather is cold; video exercise programs suggested Will follow up in 3 months  Paroxysmal A Fib (HCC) Rate controlled at this time No concerns with excessive bleeding, no falls Continue medication: Xarelto 20mg        Continue follow up with cardiology, reports requested, patient reported recent repeat ECHO  Thrombophilia (HCC) acquired R/T A Fib; continue xarelto unless bleeding contraindications Discussed S & S of bleeding  Medication management Continued  CKD III (HCC) Increase fluids, avoid NSAIDS, monitor sugars, will monitor   Continue diet and meds as discussed. Further disposition pending results of labs. Discussed med's effects and SE's.   Over 30 minutes of face to face exam, counseling, chart review, and critical decision making was performed.   Future Appointments  Date Time Provider Department  Center  07/06/2020  8:45 AM 07/08/2020, NP [facility] None  10/12/2020  9:30 AM 10/14/2020, MD [facility] None  04/18/2021 10:00 AM 13/06/2020, MD [facility] None    ----------------------------------------------------------------------------------------------------------------------  HPI 65 y.o. female  presents for 3 month follow up on HTN, HLD, Afib, hx of abnormal glucose, weight and vitamin D deficiency.   She had COVID-19 10/16/19 and reports that he is doing well.  She reports that she has not had any lasting effects from this.  BMI is There is no height or weight on file to calculate BMI., she has been working on diet and exercise - following a low carb diet. She is trying to get up hourly and drink water. Walking at lunch breaks 20-30 min. She is 179lbs today and would like to eventually get down to 165 lb. Wt Readings from Last 3 Encounters:  03/31/20 183 lb (83 kg)  12/24/19 179 lb (81.2 kg)  09/10/19 186 lb (84.4 kg)   She has history of Afib with RVR, on xarelto, had cardioversion in Jan 2018, now treated by lopressor and diltiazem. EF was 40% via cath 03/2016. She follows with cardiology. No concerns with excess bleeding. Follows with Dr. 04/2016 and her last OV was 10/18/19.   Her blood pressure has been controlled at home, today their BP is    She does workout. She denies chest pain, shortness of breath, dizziness.   She is on cholesterol medication (rosuvastatin 40 mg daily) and denies myalgias. Her cholesterol is at goal. The cholesterol last visit was:   Lab Results  Component Value Date   CHOL 166 03/31/2020   HDL 53 03/31/2020   LDLCALC  92 03/31/2020   TRIG 111 03/31/2020   CHOLHDL 3.1 03/31/2020    She has been working on diet and exercise for glucose management, and denies increased appetite, nausea, paresthesia of the feet, polydipsia, polyuria, visual disturbances and vomiting. Last A1C in the office was:  Lab Results  Component Value Date    HGBA1C 6.0 (H) 03/31/2020     Lab Results  Component Value Date   GFRNONAA 57 (L) 03/31/2020   GFRNONAA 48 (L) 12/24/2019   GFRNONAA 46 (L) 09/10/2019   Patient is on Vitamin D supplement and near goal at recent check:   Lab Results  Component Value Date   VD25OH 53 03/31/2020        Current Medications:  Current Outpatient Medications on File Prior to Visit  Medication Sig  . Cholecalciferol (VITAMIN D PO) Take 5,000 Units by mouth daily.   Marland Kitchen diltiazem (CARDIZEM) 60 MG tablet Take 60 mg by mouth 2 (two) times daily.  Marland Kitchen lisinopril (ZESTRIL) 10 MG tablet 10 mg daily.   . metoprolol (LOPRESSOR) 50 MG tablet Take 50 mg by mouth 2 (two) times daily.  Marland Kitchen OVER THE COUNTER MEDICATION Biotin 1 capsule daily.  . rivaroxaban (XARELTO) 20 MG TABS tablet Take 20 mg by mouth daily with supper.  . zinc gluconate 50 MG tablet Take 50 mg by mouth daily.   No current facility-administered medications on file prior to visit.     Allergies:  Allergies  Allergen Reactions  . Codeine     REACTION: TREMORS/NERVOUS  . Penicillins     REACTION: RASH/SWELLING     Medical History:  Past Medical History:  Diagnosis Date  . Atrial fibrillation (HCC) 03/22/2016   NEW ONSET   . Hyperlipidemia   . Hypertension   . Pre-diabetes   . Vitamin D deficiency    Family history- Reviewed and unchanged Social history- Reviewed and unchanged  [facility] Dr Henderson Cloud Mammogram in office [place] for 2021  Review of Systems:  Review of Systems  Constitutional: Negative for malaise/fatigue and weight loss.  HENT: Negative for hearing loss and tinnitus.   Eyes: Negative for blurred vision and double vision.  Respiratory: Negative for cough, shortness of breath and wheezing.   Cardiovascular: Negative for chest pain, palpitations, orthopnea, claudication and leg swelling.  Gastrointestinal: Negative for abdominal pain, blood in stool, constipation, diarrhea, heartburn, melena, nausea and vomiting.   Genitourinary: Negative.   Musculoskeletal: Negative for joint pain and myalgias.  Skin: Negative for rash.  Neurological: Negative for dizziness, tingling, sensory change, weakness and headaches.  Endo/Heme/Allergies: Negative for polydipsia.  Psychiatric/Behavioral: Negative.   All other systems reviewed and are negative.   Physical Exam: There were no vitals taken for this visit. Wt Readings from Last 3 Encounters:  03/31/20 183 lb (83 kg)  12/24/19 179 lb (81.2 kg)  09/10/19 186 lb (84.4 kg)   General Appearance: Well nourished, in no apparent distress. Eyes: PERRLA, EOMs, conjunctiva no swelling or erythema Sinuses: No Frontal/maxillary tenderness ENT/Mouth: Ext aud canals clear, TMs without erythema, bulging. No erythema, swelling, or exudate on post pharynx.  Tonsils not swollen or erythematous. Hearing normal.  Neck: Supple, thyroid normal.  Respiratory: Respiratory effort normal, BS equal bilaterally without rales, rhonchi, wheezing or stridor.  Cardio: Heart sounds irregularly irregular without audible murmur. Brisk peripheral pulses without edema.  Abdomen: Soft, + BS.  Non tender, no guarding, rebound, hernias, masses. Lymphatics: Non tender without lymphadenopathy.  Musculoskeletal: Full ROM, 5/5 strength, Normal gait Skin: Warm, dry  without rashes, lesions, ecchymosis.  Neuro: Cranial nerves intact. No cerebellar symptoms.  Psych: Awake and oriented X 3, normal affect, Insight and Judgment appropriate.       Dan Maker, NP 9:50 AM [facility]

## 2020-07-28 ENCOUNTER — Encounter: Payer: Self-pay | Admitting: Adult Health

## 2020-07-28 ENCOUNTER — Other Ambulatory Visit: Payer: Self-pay

## 2020-07-28 ENCOUNTER — Ambulatory Visit: Payer: BC Managed Care – PPO | Admitting: Adult Health

## 2020-07-28 VITALS — BP 118/80 | HR 87 | Temp 96.3°F | Wt 191.4 lb

## 2020-07-28 DIAGNOSIS — R7303 Prediabetes: Secondary | ICD-10-CM

## 2020-07-28 DIAGNOSIS — Z79899 Other long term (current) drug therapy: Secondary | ICD-10-CM

## 2020-07-28 DIAGNOSIS — E782 Mixed hyperlipidemia: Secondary | ICD-10-CM

## 2020-07-28 DIAGNOSIS — R7309 Other abnormal glucose: Secondary | ICD-10-CM

## 2020-07-28 DIAGNOSIS — E663 Overweight: Secondary | ICD-10-CM

## 2020-07-28 DIAGNOSIS — I480 Paroxysmal atrial fibrillation: Secondary | ICD-10-CM | POA: Diagnosis not present

## 2020-07-28 DIAGNOSIS — E559 Vitamin D deficiency, unspecified: Secondary | ICD-10-CM

## 2020-07-28 DIAGNOSIS — I10 Essential (primary) hypertension: Secondary | ICD-10-CM | POA: Diagnosis not present

## 2020-07-28 NOTE — Patient Instructions (Signed)
Goals    . Exercise 150 min/wk Moderate Activity    . HEMOGLOBIN A1C < 5.7    . Weight (lb) < 170 lb (77.1 kg)        SMALL CHANGES  We want weight loss that will last so you should lose 0.5-2 pounds a week.  THAT IS IT! Please pick THREE things a month to change. Once it is a habit check off the item. Then pick another three items off the list to become habits.  If you are already doing a habit on the list GREAT!  Cross that item off! o Don't drink your calories. Ie, alcohol, soda, fruit juice, and sweet tea.  o Drink more water. Drink a glass when you feel hungry or before each meal.  o Eat breakfast - Complex carb and protein (likeDannon light and fit yogurt, oatmeal, fruit, eggs, [place] bacon). o Measure your cereal.  Eat no more than one cup a day. (ie [place]) o Eat an apple a day. o Add a vegetable a day. o Try a new vegetable a month. o Use Pam! Stop using oil or butter to cook. o Don't finish your plate or use smaller plates. o Share your dessert. o Eat sugar free Jello for dessert or frozen grapes. o Don't eat 2-3 hours before bed. o Switch to whole wheat bread, pasta, and brown rice. o Make healthier choices when you eat out. No fries! o Pick baked chicken, NOT fried. o Don't forget to SLOW DOWN when you eat. It is not going anywhere.  o Take the stairs. o Park far away in the parking lot o [employer] (or weights) for 10 minutes while watching TV. o Walk at work for 10 minutes during break. o Walk outside 1 time a week with your friend, kids, dog, or significant other. o Start a walking group at church. o Walk the mall as much as you can tolerate.  o Keep a food diary. o Weigh yourself daily. o Walk for 15 minutes 3 days per week. o Cook at home more often and eat out less.  If life happens and you go back to old habits, it is okay.  Just start over. You can do it!   If you experience chest pain, get short of breath, or tired during the exercise, please stop  immediately and inform your doctor.

## 2020-07-28 NOTE — Progress Notes (Signed)
FOLLOW UP 3 MONTH  Assessment and Plan:   Hypertension Well controlled with current medications  Monitor blood pressure at home; patient to call if consistently greater than 130/80 Continue DASH diet.   Reminder to go to the ER if any CP, SOB, nausea, dizziness, severe HA, changes vision/speech, left arm numbness and tingling and jaw pain.  Hyperlipademia Currently at goal; continue with statin  Continue low cholesterol diet and exercise.   Check lipid panel.   Other abnormal glucose (prediabetes)  Continue diet and exercise.  Perform daily foot/skin check, notify office of any concerning changes.  Check A1C q69m; defer today; check CMP for serum glucose, monitor weight trends  Overweight BMI (25.0 - 29.0) Long discussion about weight loss, diet, and exercise Recommended diet heavy in fruits and veggies and low in animal meats, cheeses, and dairy products, appropriate calorie intake Discussed ideal weight for height and initial weight goal (170 lb) Patient will work on -sticking to new low carb diet, exercising, finding alternatives to walking she can do while weather is cold; video exercise programs suggested Will follow up in 3 months  Paroxysmal A Fib (HCC) Rate controlled at this time No concerns with excessive bleeding, no falls Continue medication: Xarelto 20mg        Continue follow up with cardiology, reports requested, patient reported recent repeat ECHO  Thrombophilia (HCC) acquired R/T A Fib; continue xarelto unless bleeding contraindications Discussed S & S of bleeding  Medication management Continued  CKD III (HCC) Increase fluids, avoid NSAIDS, monitor sugars, will monitor   Continue diet and meds as discussed. Further disposition pending results of labs. Discussed med's effects and SE's.   Over 30 minutes of face to face exam, counseling, chart review, and critical decision making was performed.   Future Appointments  Date Time Provider Department Center   10/12/2020  9:30 AM 10/14/2020, MD [facility] None  04/18/2021 10:00 AM 13/06/2020, MD [facility] None    ----------------------------------------------------------------------------------------------------------------------  HPI 65 y.o. female  presents for 3 month follow up on HTN, HLD, Afib, hx of abnormal glucose, weight and vitamin D deficiency.   BMI is Body mass index is 29.98 kg/m., she has been back to working on diet and exercise - last week restarted following a low carb diet and walking program. She reports has lost 3 lb on home scale. Goal 170 lb.  Wt Readings from Last 3 Encounters:  07/28/20 191 lb 6.4 oz (86.8 kg)  03/31/20 183 lb (83 kg)  12/24/19 179 lb (81.2 kg)   She has history of Afib with RVR, on xarelto, had cardioversion in Jan 2018, now treated by lopressor and diltiazem. EF was 40% via cath 03/2016. She follows with cardiology. No concerns with excess bleeding. Follows with Dr. 04/2016. Has upcoming scheduled.     Her blood pressure has been controlled at home, today their BP is BP: 118/80  She does workout. She denies chest pain, shortness of breath, dizziness.   She is on cholesterol medication (rosuvastatin 40 mg daily) and denies myalgias. Her cholesterol is at goal. The cholesterol last visit was:   Lab Results  Component Value Date   CHOL 166 03/31/2020   HDL 53 03/31/2020   LDLCALC 92 03/31/2020   TRIG 111 03/31/2020   CHOLHDL 3.1 03/31/2020    She has been working on diet and exercise for glucose management, and denies increased appetite, nausea, paresthesia of the feet, polydipsia, polyuria, visual disturbances and vomiting. Last A1C in the office was:  Lab  Results  Component Value Date   HGBA1C 6.0 (H) 03/31/2020    She has CKD III Lab Results  Component Value Date   GFRNONAA 57 (L) 03/31/2020   GFRNONAA 48 (L) 12/24/2019   GFRNONAA 46 (L) 09/10/2019   Patient is on Vitamin D supplement and near goal at recent check:   Lab  Results  Component Value Date   VD25OH 53 03/31/2020        Current Medications:  Current Outpatient Medications on File Prior to Visit  Medication Sig  . Cholecalciferol (VITAMIN D PO) Take 5,000 Units by mouth daily.   Marland Kitchen diltiazem (CARDIZEM) 60 MG tablet Take 60 mg by mouth 2 (two) times daily.  Marland Kitchen lisinopril (ZESTRIL) 10 MG tablet 10 mg daily.   . metoprolol (LOPRESSOR) 50 MG tablet Take 50 mg by mouth 2 (two) times daily.  . Multiple Vitamins-Minerals (PRESERVISION AREDS PO) Take by mouth daily.  . rivaroxaban (XARELTO) 20 MG TABS tablet Take 20 mg by mouth daily with supper.  . rosuvastatin (CRESTOR) 40 MG tablet TAKE 1 TABLET BY MOUTH DAILY FOR CHOLESTEROL  . zinc gluconate 50 MG tablet Take 50 mg by mouth daily.  Marland Kitchen OVER THE COUNTER MEDICATION Biotin 1 capsule daily.   No current facility-administered medications on file prior to visit.     Allergies:  Allergies  Allergen Reactions  . Codeine     REACTION: TREMORS/NERVOUS  . Penicillins     REACTION: RASH/SWELLING     Medical History:  Past Medical History:  Diagnosis Date  . Atrial fibrillation (HCC) 03/22/2016   NEW ONSET   . Hyperlipidemia   . Hypertension   . Pre-diabetes   . Vitamin D deficiency    Family history- Reviewed and unchanged Social history- Reviewed and unchanged  [facility] Dr Henderson Cloud Mammogram in office [place] for 2021  Review of Systems:  Review of Systems  Constitutional: Negative for malaise/fatigue and weight loss.  HENT: Negative for hearing loss and tinnitus.   Eyes: Negative for blurred vision and double vision.  Respiratory: Negative for cough, shortness of breath and wheezing.   Cardiovascular: Negative for chest pain, palpitations, orthopnea, claudication and leg swelling.  Gastrointestinal: Negative for abdominal pain, blood in stool, constipation, diarrhea, heartburn, melena, nausea and vomiting.  Genitourinary: Negative.   Musculoskeletal: Negative for joint pain and  myalgias.  Skin: Negative for rash.  Neurological: Negative for dizziness, tingling, sensory change, weakness and headaches.  Endo/Heme/Allergies: Negative for polydipsia.  Psychiatric/Behavioral: Negative.   All other systems reviewed and are negative.   Physical Exam: BP 118/80   Pulse 87   Temp (!) 96.3 F (35.7 C)   Wt 191 lb 6.4 oz (86.8 kg)   SpO2 98%   BMI 29.98 kg/m  Wt Readings from Last 3 Encounters:  07/28/20 191 lb 6.4 oz (86.8 kg)  03/31/20 183 lb (83 kg)  12/24/19 179 lb (81.2 kg)   General Appearance: Well nourished, in no apparent distress. Eyes: PERRLA, EOMs, conjunctiva no swelling or erythema Sinuses: No Frontal/maxillary tenderness ENT/Mouth: Ext aud canals clear, TMs without erythema, bulging. No erythema, swelling, or exudate on post pharynx.  Tonsils not swollen or erythematous. Hearing normal.  Neck: Supple, thyroid normal.  Respiratory: Respiratory effort normal, BS equal bilaterally without rales, rhonchi, wheezing or stridor.  Cardio: Heart sounds irregularly irregular without audible murmur. Brisk peripheral pulses without edema.  Abdomen: Soft, + BS.  Non tender, no guarding, rebound, hernias, masses. Lymphatics: Non tender without lymphadenopathy.  Musculoskeletal: Full ROM,  5/5 strength, Normal gait Skin: Warm, dry without rashes, lesions, ecchymosis.  Neuro: Cranial nerves intact. No cerebellar symptoms.  Psych: Awake and oriented X 3, normal affect, Insight and Judgment appropriate.       Dan Maker, NP 9:50 AM [facility]

## 2020-07-29 LAB — LIPID PANEL
Cholesterol: 153 mg/dL (ref <200)
HDL: 44 mg/dL — ABNORMAL LOW (ref >=50)
LDL Cholesterol (Calc): 88 mg/dL (calc)
Non-HDL Cholesterol (Calc): 109 mg/dL (calc) (ref <130)
Total CHOL/HDL Ratio: 3.5 (calc) (ref <5.0)
Triglycerides: 114 mg/dL (ref <150)

## 2020-07-29 LAB — MAGNESIUM: Magnesium: 1.9 mg/dL (ref 1.5–2.5)

## 2020-07-29 LAB — CBC WITH DIFFERENTIAL/PLATELET
Absolute Monocytes: 588 cells/uL (ref 200–950)
Basophils Absolute: 22 cells/uL (ref 0–200)
Basophils Relative: 0.4 %
Eosinophils Absolute: 162 cells/uL (ref 15–500)
Eosinophils Relative: 2.9 %
HCT: 41.8 % (ref 35.0–45.0)
Hemoglobin: 14.2 g/dL (ref 11.7–15.5)
Lymphs Abs: 1529 cells/uL (ref 850–3900)
MCH: 31.8 pg (ref 27.0–33.0)
MCHC: 34 g/dL (ref 32.0–36.0)
MCV: 93.7 fL (ref 80.0–100.0)
MPV: 11.2 fL (ref 7.5–12.5)
Monocytes Relative: 10.5 %
Neutro Abs: 3298 cells/uL (ref 1500–7800)
Neutrophils Relative %: 58.9 %
Platelets: 192 10*3/uL (ref 140–400)
RBC: 4.46 10*6/uL (ref 3.80–5.10)
RDW: 12.4 % (ref 11.0–15.0)
Total Lymphocyte: 27.3 %
WBC: 5.6 10*3/uL (ref 3.8–10.8)

## 2020-07-29 LAB — COMPLETE METABOLIC PANEL WITH GFR
AG Ratio: 1.5 (calc) (ref 1.0–2.5)
ALT: 25 U/L (ref 6–29)
AST: 34 U/L (ref 10–35)
Albumin: 4.2 g/dL (ref 3.6–5.1)
Alkaline phosphatase (APISO): 118 U/L (ref 37–153)
BUN/Creatinine Ratio: 19 (calc) (ref 6–22)
BUN: 19 mg/dL (ref 7–25)
CO2: 27 mmol/L (ref 20–32)
Calcium: 10.1 mg/dL (ref 8.6–10.4)
Chloride: 106 mmol/L (ref 98–110)
Creat: 1.01 mg/dL — ABNORMAL HIGH (ref 0.50–0.99)
GFR, Est African American: 68 mL/min/{1.73_m2} (ref >=60)
GFR, Est Non African American: 58 mL/min/{1.73_m2} — ABNORMAL LOW (ref >=60)
Globulin: 2.8 g/dL (calc) (ref 1.9–3.7)
Glucose, Bld: 91 mg/dL (ref 65–99)
Potassium: 4.6 mmol/L (ref 3.5–5.3)
Sodium: 142 mmol/L (ref 135–146)
Total Bilirubin: 0.4 mg/dL (ref 0.2–1.2)
Total Protein: 7 g/dL (ref 6.1–8.1)

## 2020-07-29 LAB — TSH: TSH: 2.43 mIU/L (ref 0.40–4.50)

## 2020-08-10 DIAGNOSIS — I5022 Chronic systolic (congestive) heart failure: Secondary | ICD-10-CM | POA: Diagnosis not present

## 2020-08-10 DIAGNOSIS — I10 Essential (primary) hypertension: Secondary | ICD-10-CM | POA: Diagnosis not present

## 2020-08-10 DIAGNOSIS — I4821 Permanent atrial fibrillation: Secondary | ICD-10-CM | POA: Diagnosis not present

## 2020-08-10 DIAGNOSIS — I342 Nonrheumatic mitral (valve) stenosis: Secondary | ICD-10-CM | POA: Diagnosis not present

## 2020-10-11 ENCOUNTER — Encounter: Payer: Self-pay | Admitting: Internal Medicine

## 2020-10-11 NOTE — Patient Instructions (Signed)

## 2020-10-11 NOTE — Progress Notes (Signed)
Future Appointments  Date Time Provider Department Center  10/12/2020  9:30 AM Lucky Cowboy, MD [facility] None  04/18/2021 10:00 AM Lucky Cowboy, MD [facility] None     History of Present Illness:       This very nice 65 y.o. MWF presents for 6 month follow up with HTN, HLD, pAfib, Pre-Diabetes and Vitamin D Deficiency.        Patient is followed  for HTN  (2007) & BP has been controlled at home. In 2017, patient had CV of pAfib and had a negative Heart Cath by Dr Algie Coffer. She's been on Xarelto since.  Today's BP is at goal -  110/86. Patient has had no complaints of any cardiac type chest pain, palpitations, dyspnea / orthopnea / PND, dizziness, claudication, but is c/o dependent edema today.       Hyperlipidemia is controlled with diet & Rosuvastatin. Patient denies myalgias or other med SE's. Last Lipids were at goal:  Lab Results  Component Value Date   CHOL 153 07/28/2020   HDL 44 (L) 07/28/2020   LDLCALC 88 07/28/2020   TRIG 114 07/28/2020   CHOLHDL 3.5 07/28/2020     Also, the patient has history of PreDiabetes (A1c 5.9% /2020) and has had no symptoms of reactive hypoglycemia, diabetic polys, paresthesias or visual blurring.  Last A1c was not at goal:  Lab Results  Component Value Date   HGBA1C 6.0 (H) 03/31/2020           Further, the patient also has history of Vitamin D Deficiency ("10" /2008) and supplements vitamin D without any suspected side-effects. Last vitamin D was near goal (70-100):  Lab Results  Component Value Date   VD25OH 53 03/31/2020    Current Outpatient Medications on File Prior to Visit  Medication Sig  . VITAMIN D Take 5,000 Units  daily.   Marland Kitchen diltiazem 60 MG tablet Take  2  times daily.  . Lisinopril  10 MG tablet 10 mg daily.   Marland Kitchen LOPRESSOR 50 MG tablet Take  2 times daily.  Marland Kitchen PRESERVISION AREDS Take daily.  Carlena Hurl 20 MG TABS Take  daily with supper.  . rosuvastatin  40 MG tablet TAKE 1 TABLET DAILY  . zinc 50 MG tablet  Take  daily.     Allergies  Allergen Reactions  . Codeine     REACTION: TREMORS/NERVOUS  . Penicillins     REACTION: RASH/SWELLING    PMHx:   Past Medical History:  Diagnosis Date  . Atrial fibrillation (HCC) 03/22/2016   NEW ONSET   . Hyperlipidemia   . Hypertension   . Pre-diabetes   . Vitamin D deficiency     Immunization History  Administered Date(s) Administered  . Moderna Sars-Covid-2 Vaccination 07/24/2020  . PPD Test 08/05/2015, 09/05/2016, 10/01/2017, 03/05/2019  . Tdap 09/19/2010    Past Surgical History:  Procedure Laterality Date  . CARDIAC CATHETERIZATION N/A 03/23/2016   Procedure: Right/Left Heart Cath and Coronary Angiography;  Surgeon: Orpah Cobb, MD;  Location: [facility];  Service: Cardiovascular;  Laterality: N/A;  . CARDIOVERSION N/A 07/13/2016   Procedure: CARDIOVERSION;  Surgeon: Orpah Cobb, MD;  Location: [facility]OS[facility];  Service: Cardiovascular;  Laterality: N/A;  . CYST FROM WRIST Right   . TUBAL LIGATION      FHx:    Reviewed / unchanged  SHx:    Reviewed / unchanged   Systems Review:  Constitutional: Denies fever, chills, wt changes, headaches, insomnia, fatigue, night  sweats, change in appetite. Eyes: Denies redness, blurred vision, diplopia, discharge, itchy, watery eyes.  ENT: Denies discharge, congestion, post nasal drip, epistaxis, sore throat, earache, hearing loss, dental pain, tinnitus, vertigo, sinus pain, snoring.  CV: Denies chest pain, palpitations, irregular heartbeat, syncope, dyspnea, diaphoresis, orthopnea, PND, claudication or edema. Respiratory: denies cough, dyspnea, DOE, pleurisy, hoarseness, laryngitis, wheezing.  Gastrointestinal: Denies dysphagia, odynophagia, heartburn, reflux, water brash, abdominal pain or cramps, nausea, vomiting, bloating, diarrhea, constipation, hematemesis, melena, hematochezia  or hemorrhoids. Genitourinary: Denies dysuria, frequency, urgency, nocturia, hesitancy, discharge,  hematuria or flank pain. Musculoskeletal: Denies arthralgias, myalgias, stiffness, jt. swelling, pain, limping or strain/sprain.  Skin: Denies pruritus, rash, hives, warts, acne, eczema or change in skin lesion(s). Neuro: No weakness, tremor, incoordination, spasms, paresthesia or pain. Psychiatric: Denies confusion, memory loss or sensory loss. Endo: Denies change in weight, skin or hair change.  Heme/Lymph: No excessive bleeding, bruising or enlarged lymph nodes.  Physical Exam  BP 110/86   Pulse (!) 58   Temp (!) 97.3 F (36.3 C)   Resp 16   Ht 5\' 7"  (1.702 m)   Wt 191 lb 6.4 oz (86.8 kg)   SpO2 97%   BMI 29.98 kg/m   Appears  well nourished, well groomed  and in no distress.  Eyes: PERRLA, EOMs, conjunctiva no swelling or erythema. Sinuses: No frontal/maxillary tenderness ENT/Mouth: EAC's clear, TM's nl w/o erythema, bulging. Nares clear w/o erythema, swelling, exudates. Oropharynx clear without erythema or exudates. Oral hygiene is good. Tongue normal, non obstructing. Hearing intact.  Neck: Supple. Thyroid not palpable. Car 2+/2+ without bruits, nodes or JVD. Chest: Respirations nl with BS clear & equal w/o rales, rhonchi, wheezing or stridor.  Cor: Heart sounds normal w/ regular rate and rhythm without sig. murmurs, gallops, clicks or rubs. Peripheral pulses normal and equal with 1 (+) pretibial edema to the proximal shins. Abdomen: Soft & bowel sounds normal. Non-tender w/o guarding, rebound, hernias, masses or organomegaly.  Lymphatics: Unremarkable.  Musculoskeletal: Full ROM all peripheral extremities, joint stability, 5/5 strength and normal gait.  Skin: Warm, dry without exposed rashes, lesions or ecchymosis apparent.  Neuro: Cranial nerves intact, reflexes equal bilaterally. Sensory-motor testing grossly intact. Tendon reflexes grossly intact.  Pysch: Alert & oriented x 3.  Insight and judgement nl & appropriate. No ideations.  Assessment and Plan:  1. Essential  hypertension  - Continue medication, monitor blood pressure at home.  - Continue DASH diet.  Reminder to go to the ER if any CP,  SOB, nausea, dizziness, severe HA, changes vision/speech.  - add Rx Lasix 40 mg prn edema  - CBC with Differential/Platelet - COMPLETE METABOLIC PANEL WITH GFR - Magnesium - TSH  2. Hyperlipidemia, mixed  - Continue diet/meds, exercise,& lifestyle modifications.  - Continue monitor periodic cholesterol/liver & renal functions   - Lipid panel - TSH  3. Abnormal glucose  - Continue diet, exercise  - Lifestyle modifications.  - Monitor appropriate labs  - Hemoglobin A1c - Insulin, random  4. Vitamin D deficiency  - Continue supplementation.  - VITAMIN D 25 Hydroxyl  5. Paroxysmal atrial fibrillation (HCC)  - TSH  6. Medication management  - CBC with Differential/Platelet - COMPLETE METABOLIC PANEL WITH GFR - Magnesium - Lipid panel - TSH - Hemoglobin A1c - Insulin, random - VITAMIN D 25 Hydroxyl        Discussed  regular exercise, BP monitoring, weight control to achieve/maintain BMI less than 25 and discussed med and SE's. Recommended labs to assess and monitor  clinical status with further disposition pending results of labs.  I discussed the assessment and treatment plan with the patient. The patient was provided an opportunity to ask questions and all were answered. The patient agreed with the plan and demonstrated an understanding of the instructions.  I provided over 30 minutes of exam, counseling, chart review and  complex critical decision making.        The patient was advised to call back or seek an in-person evaluation if the symptoms worsen or if the condition fails to improve as anticipated.   Marinus Maw, MD

## 2020-10-12 ENCOUNTER — Other Ambulatory Visit: Payer: Self-pay

## 2020-10-12 ENCOUNTER — Ambulatory Visit: Payer: BC Managed Care – PPO | Admitting: Internal Medicine

## 2020-10-12 VITALS — BP 110/86 | HR 58 | Temp 97.3°F | Resp 16 | Ht 67.0 in | Wt 191.4 lb

## 2020-10-12 DIAGNOSIS — Z79899 Other long term (current) drug therapy: Secondary | ICD-10-CM | POA: Diagnosis not present

## 2020-10-12 DIAGNOSIS — R7309 Other abnormal glucose: Secondary | ICD-10-CM

## 2020-10-12 DIAGNOSIS — I10 Essential (primary) hypertension: Secondary | ICD-10-CM

## 2020-10-12 DIAGNOSIS — E559 Vitamin D deficiency, unspecified: Secondary | ICD-10-CM | POA: Diagnosis not present

## 2020-10-12 DIAGNOSIS — E782 Mixed hyperlipidemia: Secondary | ICD-10-CM

## 2020-10-12 DIAGNOSIS — I480 Paroxysmal atrial fibrillation: Secondary | ICD-10-CM

## 2020-10-12 MED ORDER — FUROSEMIDE 40 MG PO TABS: ORAL_TABLET | ORAL | 3 refills | Status: DC

## 2020-10-13 LAB — COMPLETE METABOLIC PANEL WITH GFR
AG Ratio: 1.5 (calc) (ref 1.0–2.5)
ALT: 21 U/L (ref 6–29)
AST: 28 U/L (ref 10–35)
Albumin: 4.5 g/dL (ref 3.6–5.1)
Alkaline phosphatase (APISO): 116 U/L (ref 37–153)
BUN/Creatinine Ratio: 20 (calc) (ref 6–22)
BUN: 20 mg/dL (ref 7–25)
CO2: 26 mmol/L (ref 20–32)
Calcium: 10.4 mg/dL (ref 8.6–10.4)
Chloride: 105 mmol/L (ref 98–110)
Creat: 1.01 mg/dL — ABNORMAL HIGH (ref 0.50–0.99)
GFR, Est African American: 68 mL/min/{1.73_m2} (ref >=60)
GFR, Est Non African American: 58 mL/min/{1.73_m2} — ABNORMAL LOW (ref >=60)
Globulin: 3 g/dL (calc) (ref 1.9–3.7)
Glucose, Bld: 108 mg/dL — ABNORMAL HIGH (ref 65–99)
Potassium: 4.4 mmol/L (ref 3.5–5.3)
Sodium: 141 mmol/L (ref 135–146)
Total Bilirubin: 0.4 mg/dL (ref 0.2–1.2)
Total Protein: 7.5 g/dL (ref 6.1–8.1)

## 2020-10-13 LAB — HEMOGLOBIN A1C
Hgb A1c MFr Bld: 5.9 % of total Hgb — ABNORMAL HIGH (ref <5.7)
Mean Plasma Glucose: 123 mg/dL
eAG (mmol/L): 6.8 mmol/L

## 2020-10-13 LAB — VITAMIN D 25 HYDROXY (VIT D DEFICIENCY, FRACTURES): Vit D, 25-Hydroxy: 53 ng/mL (ref 30–100)

## 2020-10-13 LAB — CBC WITH DIFFERENTIAL/PLATELET
Absolute Monocytes: 730 cells/uL (ref 200–950)
Basophils Absolute: 23 cells/uL (ref 0–200)
Basophils Relative: 0.3 %
Eosinophils Absolute: 91 cells/uL (ref 15–500)
Eosinophils Relative: 1.2 %
HCT: 42.7 % (ref 35.0–45.0)
Hemoglobin: 14.3 g/dL (ref 11.7–15.5)
Lymphs Abs: 2189 cells/uL (ref 850–3900)
MCH: 31.6 pg (ref 27.0–33.0)
MCHC: 33.5 g/dL (ref 32.0–36.0)
MCV: 94.5 fL (ref 80.0–100.0)
MPV: 11.2 fL (ref 7.5–12.5)
Monocytes Relative: 9.6 %
Neutro Abs: 4568 cells/uL (ref 1500–7800)
Neutrophils Relative %: 60.1 %
Platelets: 231 10*3/uL (ref 140–400)
RBC: 4.52 10*6/uL (ref 3.80–5.10)
RDW: 12.8 % (ref 11.0–15.0)
Total Lymphocyte: 28.8 %
WBC: 7.6 10*3/uL (ref 3.8–10.8)

## 2020-10-13 LAB — MAGNESIUM: Magnesium: 2.2 mg/dL (ref 1.5–2.5)

## 2020-10-13 LAB — LIPID PANEL
Cholesterol: 174 mg/dL (ref <200)
HDL: 50 mg/dL (ref >=50)
LDL Cholesterol (Calc): 100 mg/dL (calc) — ABNORMAL HIGH
Non-HDL Cholesterol (Calc): 124 mg/dL (calc) (ref <130)
Total CHOL/HDL Ratio: 3.5 (calc) (ref <5.0)
Triglycerides: 144 mg/dL (ref <150)

## 2020-10-13 LAB — TSH: TSH: 2.42 mIU/L (ref 0.40–4.50)

## 2020-10-13 LAB — INSULIN, RANDOM: Insulin: 23 u[IU]/mL — ABNORMAL HIGH

## 2020-10-13 NOTE — Progress Notes (Signed)
============================================================ - Test results slightly outside the reference range are not unusual. If there is anything important, I will review this with you,  otherwise it is considered normal test values.  If you have further questions,  please do not hesitate to contact me at the office or via My Chart.  ============================================================ ============================================================  -  Kidney Functions - still GFR 58 - Stable Stage 3a - Great  ! ============================================================ ============================================================  -  Total Chol = 174 - Excellent   - Very low risk for Heart Attack  / Stroke ========================================================  - But the bad LDL Chol = 100 - a little elevated        (Ideal or goal is less than 70 )   - So -  Recommend a stricter low cholesterol diet   - Cholesterol only comes from animal sources  - ie. meat, dairy, egg yolks  - Eat all the vegetables you want.  - Avoid meat, especially red meat - Beef AND Pork .  - Avoid cheese & dairy - milk & ice cream.     - Cheese is the most concentrated form of trans-fats which  is the worst thing to clog up our arteries.   - Veggie cheese is OK which can be found in the fresh  produce section at [employer] or [employer] or [employer] ============================================================ ============================================================  - A1c = 5.9%  Blood sugar and A1c are STILL elevated in the borderline and  early or pre-diabetes range which has the same   300% increased risk for heart attack, stroke, cancer and   Alzheimer- type Vascular Dementia as full blown diabetes.   But the good news is that diet, exercise with  weight loss can cure the early diabetes at this point. ============================================================  -   It is very important that you work harder with diet by  avoiding all foods that are white except chicken,   fish & calliflower.  - Avoid white rice  (brown & wild rice is OK),   - Avoid white potatoes  (sweet potatoes in moderation is OK),   White bread or wheat bread or anything made out of   white flour like bagels, donuts, rolls, buns, biscuits, cakes,  - pastries, cookies, pizza crust, and pasta (made from  white flour & egg whites)   - vegetarian pasta or spinach or wheat pasta is OK.  - Multigrain breads like Arnold's, Pepperidge Farm or   multigrain sandwich thins or high fiber breads like   Eureka bread or "Dave's Killer" breads that are  4 to 5 grams fiber per slice !  are best.    Diet, exercise and weight loss can reverse and cure  diabetes in the early stages.   ============================================================ ============================================================  -  Vitamin D = 53 - still slightly low   - Vitamin D goal is between 70-100.   - Please INCREASE your Vitamin D 5,000 unit capsule to 2 capsules/day  - It is very important as a natural anti-inflammatory and helping the  immune system protect against viral infections, like the Covid-19    helping hair, skin, and nails, as well as reducing stroke and  heart attack risk.   - It helps your bones and helps with mood.  - It also decreases numerous cancer risks so please  take it as directed.   - Low Vit D is associated with a 200-300% higher risk for  CANCER   and 200-300% higher risk for HEART   ATTACK  &  STROKE.    - It is also associated with higher death rate at younger ages,   autoimmune diseases like Rheumatoid arthritis, Lupus,  Multiple Sclerosis.     - Also many other serious conditions, like depression, Alzheimer's  Dementia, infertility, muscle aches, fatigue, fibromyalgia   - just to name a  few. ============================================================ ============================================================  -  All Else - CBC - Kidneys - Electrolytes - Liver - Magnesium & Thyroid    - all  Normal / OK ============================================================ ============================================================  -  Keep up the [place] Work  ! ============================================================ ============================================================

## 2020-12-08 ENCOUNTER — Other Ambulatory Visit: Payer: Self-pay | Admitting: Cardiovascular Disease

## 2020-12-08 ENCOUNTER — Ambulatory Visit
Admission: RE | Admit: 2020-12-08 | Discharge: 2020-12-08 | Disposition: A | Discharge status: Home / Self Care | Payer: BC Managed Care – PPO | Source: Ambulatory Visit | Attending: Cardiovascular Disease | Admitting: Cardiovascular Disease

## 2020-12-08 DIAGNOSIS — M25462 Effusion, left knee: Secondary | ICD-10-CM

## 2020-12-08 DIAGNOSIS — M25561 Pain in right knee: Secondary | ICD-10-CM | POA: Diagnosis not present

## 2020-12-08 DIAGNOSIS — M25461 Effusion, right knee: Secondary | ICD-10-CM

## 2020-12-08 DIAGNOSIS — M25562 Pain in left knee: Secondary | ICD-10-CM

## 2020-12-08 DIAGNOSIS — M7989 Other specified soft tissue disorders: Secondary | ICD-10-CM | POA: Diagnosis not present

## 2021-01-19 ENCOUNTER — Other Ambulatory Visit: Payer: Self-pay | Admitting: Adult Health

## 2021-03-01 DIAGNOSIS — I10 Essential (primary) hypertension: Secondary | ICD-10-CM | POA: Diagnosis not present

## 2021-03-01 DIAGNOSIS — I342 Nonrheumatic mitral (valve) stenosis: Secondary | ICD-10-CM | POA: Diagnosis not present

## 2021-03-01 DIAGNOSIS — I5022 Chronic systolic (congestive) heart failure: Secondary | ICD-10-CM | POA: Diagnosis not present

## 2021-03-01 DIAGNOSIS — I4821 Permanent atrial fibrillation: Secondary | ICD-10-CM | POA: Diagnosis not present

## 2021-03-27 ENCOUNTER — Ambulatory Visit (INDEPENDENT_AMBULATORY_CARE_PROVIDER_SITE_OTHER): Payer: BC Managed Care – PPO | Admitting: Nurse Practitioner

## 2021-03-27 ENCOUNTER — Ambulatory Visit
Admission: RE | Admit: 2021-03-27 | Discharge: 2021-03-27 | Disposition: A | Discharge status: Home / Self Care | Payer: BC Managed Care – PPO | Source: Ambulatory Visit | Attending: Nurse Practitioner | Admitting: Nurse Practitioner

## 2021-03-27 ENCOUNTER — Other Ambulatory Visit: Payer: Self-pay

## 2021-03-27 ENCOUNTER — Encounter: Payer: Self-pay | Admitting: Nurse Practitioner

## 2021-03-27 VITALS — BP 120/90 | HR 99 | Temp 97.7°F

## 2021-03-27 DIAGNOSIS — E782 Mixed hyperlipidemia: Secondary | ICD-10-CM | POA: Diagnosis not present

## 2021-03-27 DIAGNOSIS — I480 Paroxysmal atrial fibrillation: Secondary | ICD-10-CM | POA: Diagnosis not present

## 2021-03-27 DIAGNOSIS — I10 Essential (primary) hypertension: Secondary | ICD-10-CM | POA: Diagnosis not present

## 2021-03-27 DIAGNOSIS — Z1152 Encounter for screening for COVID-19: Secondary | ICD-10-CM | POA: Diagnosis not present

## 2021-03-27 DIAGNOSIS — R7309 Other abnormal glucose: Secondary | ICD-10-CM | POA: Diagnosis not present

## 2021-03-27 DIAGNOSIS — J40 Bronchitis, not specified as acute or chronic: Secondary | ICD-10-CM

## 2021-03-27 DIAGNOSIS — R062 Wheezing: Secondary | ICD-10-CM | POA: Diagnosis not present

## 2021-03-27 DIAGNOSIS — R059 Cough, unspecified: Secondary | ICD-10-CM | POA: Diagnosis not present

## 2021-03-27 LAB — POC COVID19 BINAXNOW: SARS Coronavirus 2 Ag: NEGATIVE

## 2021-03-27 MED ORDER — PROMETHAZINE-DM 6.25-15 MG/5ML PO SYRP: 5.0000 mL | ORAL_SOLUTION | Freq: Four times a day (QID) | ORAL | 1 refills | Status: DC | PRN

## 2021-03-27 MED ORDER — BENZONATATE 100 MG PO CAPS
100.0000 mg | ORAL_CAPSULE | Freq: Four times a day (QID) | ORAL | 1 refills | Status: DC | PRN
Start: 2021-03-27 — End: 2021-04-26

## 2021-03-27 MED ORDER — DEXAMETHASONE 0.5 MG PO TABS: ORAL_TABLET | ORAL | 0 refills | Status: DC

## 2021-03-27 MED ORDER — AZITHROMYCIN 250 MG PO TABS: ORAL_TABLET | ORAL | 1 refills | Status: DC

## 2021-03-27 NOTE — Patient Instructions (Signed)
Acute Bronchitis, Adult  Acute bronchitis is sudden or acute swelling of the air tubes (bronchi) in the lungs. Acute bronchitis causes these tubes to fill with mucus, whichcan make it hard to breathe. It can also cause coughing or wheezing. In adults, acute bronchitis usually goes away within 2 weeks. A cough caused by bronchitis may last up to 3 weeks. Smoking, allergies, and asthma can make thecondition worse. What are the causes? This condition can be caused by germs and by substances that irritate the lungs, including: Cold and flu viruses. The most common cause of this condition is the virus that causes the common cold. Bacteria. Substances that irritate the lungs, including: Smoke from cigarettes and other forms of tobacco. Dust and pollen. Fumes from chemical products, gases, or burned fuel. Other materials that pollute indoor or outdoor air. Close contact with someone who has acute bronchitis. What increases the risk? The following factors may make you more likely to develop this condition: A weak body's defense system, also called the immune system. A condition that affects your lungs and breathing, such as asthma. What are the signs or symptoms? Common symptoms of this condition include: Lung and breathing problems, such as: Coughing. This may bring up clear, yellow, or green mucus from your lungs (sputum). Wheezing. Having too much mucus in your lungs (chest congestion). Having shortness of breath. A fever. Chills. Aches and pains, including: Tightness in your chest and other body aches. A sore throat. How is this diagnosed? This condition is usually diagnosed based on: Your symptoms and medical history. A physical exam. You may also have other tests, including tests to rule out other conditions, such as pneumonia. These tests include: A test of lung function. Test of a mucus sample to look for the presence of bacteria. Tests to check the oxygen level in your  blood. Blood tests. Chest X-ray. How is this treated? Most cases of acute bronchitis clear up over time without treatment. Your health care provider may recommend: Drinking more fluids. This can thin your mucus, which may improve your breathing. Using a device that gets medicine into your lungs (inhaler) to help improve breathing and control coughing. Using a vaporizer or a humidifier. These are machines that add water to the air to help you breathe better. Taking a medicine for a fever. Taking a medicine that thins mucus and clears congestion (expectorant). Taking a medicine that prevents or stops coughing (cough suppressant). Follow these instructions at home: Activity Get plenty of rest. Return to your normal activities as told by your health care provider. Ask your health care provider what activities are safe for you. Lifestyle  Drink enough fluid to keep your urine pale yellow. Do not drink alcohol. Do not use any products that contain nicotine or tobacco, such as cigarettes, e-cigarettes, and chewing tobacco. If you need help quitting, ask your health care provider. Be aware that: Your bronchitis will get worse if you smoke or breathe in other people's smoke (secondhand smoke). Your lungs will heal faster if you quit smoking.  General instructions Take over-the-counter and prescription medicines only as told by your health care provider. Use an inhaler, vaporizer, or humidifier as told by your health care provider. If you have a sore throat, gargle with a salt-water mixture 3-4 times a day or as needed. To make a salt-water mixture, completely dissolve -1 tsp (3-6 g) of salt in 1 cup (237 mL) of warm water. Take two teaspoons of honey at bedtime to lessen coughing at night.   Keep all follow-up visits as told by your health care provider. This is important. How is this prevented? To lower your risk of getting this condition again: Wash your hands often with soap and water. If  soap and water are not available, use hand sanitizer. Avoid contact with people who have cold symptoms. Try not to touch your mouth, nose, or eyes with your hands. Avoid places where there are fumes from chemicals. Breathing these fumes will make your condition worse. Get the flu shot every year. Contact a health care provider if: Your symptoms do not improve after 2 weeks of treatment. You vomit more than once or twice. You have symptoms of dehydration such as: Dark urine. Dry skin or eyes. Increased thirst. Headaches. Confusion. Muscle cramps. Get help right away if you: Cough up blood. Feel pain in your chest. Have severe shortness of breath. Faint or keep feeling like you are going to faint. Have a severe headache. Have fever or chills that get worse. These symptoms may represent a serious problem that is an emergency. Do not wait to see if the symptoms will go away. Get medical help right away. Call your local emergency services (911 in the [place] Do not drive yourself to the hospital. Summary Acute bronchitis is sudden (acute) inflammation of the air tubes (bronchi) between the windpipe and the lungs. In adults, acute bronchitis usually goes away within 2 weeks, although coughing may last 3 weeks or longer. Take over-the-counter and prescription medicines only as told by your health care provider. Drink enough fluid to keep your urine pale yellow. Contact a health care provider if your symptoms do not improve after 2 weeks of treatment. Get help right away if you cough up blood, faint, or have chest pain or shortness of breath. This information is not intended to replace advice given to you by your health care provider. Make sure you discuss any questions you have with your healthcare provider. Document Revised: 05/04/2020 Document Reviewed: 12/26/2018 Elsevier Patient Education  2022 [employer]  

## 2021-03-27 NOTE — Progress Notes (Signed)
Assessment and Plan:  Alie was seen today for acute visit.  Diagnoses and all orders for this visit:  Encounter for screening for COVID-19 -     POC COVID-19- Negative  Paroxysmal atrial fibrillation (HCC)  Rate controlled at this time No concerns with excessive bleeding, no falls Continue medication: Xarelto 20mg        Continue follow up with cardiology  Essential hypertension -     CBC with Differential/Platelet - continue medications, DASH diet, exercise and monitor at home. Call if greater than 130/80.    Abnormal glucose -     Hemoglobin A1c - Continue diet and exercise  Hyperlipidemia, mixed -     COMPLETE METABOLIC PANEL WITH GFR -     Lipid panel - Continue low cholesterol diet and exercise  Bronchitis -     azithromycin (ZITHROMAX) 250 MG tablet; Take 2 tablets (500 mg) on  Day 1,  followed by 1 tablet (250 mg) once daily on Days 2 through 5. -     benzonatate (TESSALON PERLES) 100 MG capsule; Take 1 capsule (100 mg total) by mouth every 6 (six) hours as needed for cough. -     promethazine-dextromethorphan (PROMETHAZINE-DM) 6.25-15 MG/5ML syrup; Take 5 mLs by mouth 4 (four) times daily as needed for cough. -     DG Chest 2 View; Future -     dexamethasone (DECADRON) 0.5 MG tablet; Take 1 tab 3 x day - 3 days, then 2 x day - 3 days, then 1 tab daily  Continue Mucinex as needed, Duoneb given in the office.   Further disposition pending results of labs. Discussed med's effects and SE's.   Over 30 minutes of exam, counseling, chart review, and critical decision making was performed.   Future Appointments  Date Time Provider Department Center  04/18/2021 10:00 AM 13/06/2020, MD [facility] None    ------------------------------------------------------------------------------------------------------------------   HPI BP 120/90   Pulse 99   Temp 97.7 F (36.5 C)   SpO2 97%  65 y.o.female presents for Congestion.  Pt states that symptoms began 1 week ago  with cough that was non productive. Denies fever. Has been having some myalgias.  Used Mucinex for 5 days and had no relief of symptoms.  Started having worsening cough and wheezing- no relief of symptoms with Nyquil day and night. Some green mucus at beginning but is clear now.     She has history of Afib with RVR, on xarelto, had cardioversion in Jan 2018, now treated by lopressor and diltiazem. EF was 40% via cath 03/2016. She follows with cardiology. No concerns with excess bleeding. Follows with Dr. 04/2016. Has upcoming scheduled.      Her blood pressure has been controlled at home, today their BP is BP: 118/80 BP Readings from Last 3 Encounters:  03/27/21 120/90  10/12/20 110/86  07/28/20 118/80     She does workout. She denies chest pain, shortness of breath, dizziness.    She is on cholesterol medication (rosuvastatin 40 mg daily) and denies myalgias. Her cholesterol is at goal. The cholesterol last visit was:   Lab Results  Component Value Date   CHOL 174 10/12/2020   HDL 50 10/12/2020   LDLCALC 100 (H) 10/12/2020   TRIG 144 10/12/2020   CHOLHDL 3.5 10/12/2020       She has been working on diet and exercise for glucose management, and denies increased appetite, nausea, paresthesia of the feet, polydipsia, polyuria, visual disturbances and vomiting. Last A1C in the office  was:  Lab Results  Component Value Date   HGBA1C 5.9 (H) 10/12/2020      Patient is on Vitamin D supplement and near goal at recent check:    Past Medical History:  Diagnosis Date   Atrial fibrillation (HCC) 03/22/2016   NEW ONSET    Hyperlipidemia    Hypertension    Pre-diabetes    Vitamin D deficiency      Allergies  Allergen Reactions   Codeine     REACTION: TREMORS/NERVOUS   Penicillins     REACTION: RASH/SWELLING    Current Outpatient Medications on File Prior to Visit  Medication Sig   Cholecalciferol (VITAMIN D PO) Take 5,000 Units by mouth daily.    diltiazem (CARDIZEM) 60 MG  tablet Take 60 mg by mouth 2 (two) times daily.   furosemide (LASIX) 40 MG tablet Take 1 tablet Daily for BP and Fluid Retention / Ankle Swelling   lisinopril (ZESTRIL) 10 MG tablet 10 mg daily.    metoprolol (LOPRESSOR) 50 MG tablet Take 50 mg by mouth 2 (two) times daily.   Multiple Vitamins-Minerals (PRESERVISION AREDS PO) Take by mouth daily.   rivaroxaban (XARELTO) 20 MG TABS tablet Take 20 mg by mouth daily with supper.   rosuvastatin (CRESTOR) 40 MG tablet TAKE 1 TABLET BY MOUTH DAILY FOR CHOLESTEROL   zinc gluconate 50 MG tablet Take 50 mg by mouth daily.   No current facility-administered medications on file prior to visit.    Review of Systems  Constitutional:  Positive for malaise/fatigue. Negative for chills and fever.  HENT:  Positive for congestion. Negative for hearing loss, sinus pain, sore throat and tinnitus.   Eyes:  Negative for blurred vision and double vision.  Respiratory:  Positive for cough and wheezing. Negative for hemoptysis, sputum production and shortness of breath.   Cardiovascular:  Negative for chest pain, palpitations and leg swelling.  Gastrointestinal:  Negative for abdominal pain, constipation, diarrhea, heartburn, nausea and vomiting.  Genitourinary:  Negative for dysuria and urgency.  Musculoskeletal:  Positive for myalgias. Negative for back pain, falls, joint pain and neck pain.  Skin:  Negative for rash.  Neurological:  Negative for dizziness, tingling, tremors, weakness and headaches.  Endo/Heme/Allergies:  Does not bruise/bleed easily.  Psychiatric/Behavioral:  Negative for depression and suicidal ideas. The patient is not nervous/anxious and does not have insomnia.     Physical Exam:  BP 120/90   Pulse 99   Temp 97.7 F (36.5 C)   SpO2 97%   General Appearance: Well nourished, in no apparent distress. Eyes: PERRLA, EOMs, conjunctiva no swelling or erythema Sinuses: No Frontal/maxillary tenderness ENT/Mouth: Ext aud canals clear, TMs  without erythema, bulging. No erythema, swelling, or exudate on post pharynx.  Tonsils not swollen or erythematous. Hearing normal.  Neck: Supple, thyroid normal.  Respiratory: Respiratory effort normal, BS Equal bilaterally, wheezing noted in upperlobes bilaterally, slightly diminished at bases. Cardio: RRR with no MRGs. Brisk peripheral pulses without edema.  Abdomen: Soft, + BS.  Non tender, no guarding, rebound, hernias, masses. Lymphatics: Non tender without lymphadenopathy.  Musculoskeletal: Full ROM, 5/5 strength, normal gait.  Skin: Warm, dry without rashes, lesions, ecchymosis.  Neuro: Cranial nerves intact. Normal muscle tone, no cerebellar symptoms. Sensation intact.  Psych: Awake and oriented X 3, normal affect, Insight and Judgment appropriate.  DUONEB: After Neb treatment wheezing did improved somewhat   Dresden Lozito Gigi Gin, NP 12:35 PM [facility]

## 2021-03-27 NOTE — Progress Notes (Signed)
Please call and advise Chest xray showed no pneumonia.  Continue current treatment plan

## 2021-03-28 ENCOUNTER — Ambulatory Visit: Payer: BC Managed Care – PPO | Admitting: Internal Medicine

## 2021-03-28 LAB — COMPLETE METABOLIC PANEL WITH GFR
AG Ratio: 1.5 (calc) (ref 1.0–2.5)
ALT: 25 U/L (ref 6–29)
AST: 29 U/L (ref 10–35)
Albumin: 4.8 g/dL (ref 3.6–5.1)
Alkaline phosphatase (APISO): 133 U/L (ref 37–153)
BUN/Creatinine Ratio: 15 (calc) (ref 6–22)
BUN: 16 mg/dL (ref 7–25)
CO2: 29 mmol/L (ref 20–32)
Calcium: 10.1 mg/dL (ref 8.6–10.4)
Chloride: 102 mmol/L (ref 98–110)
Creat: 1.06 mg/dL — ABNORMAL HIGH (ref 0.50–1.05)
Globulin: 3.1 g/dL (calc) (ref 1.9–3.7)
Glucose, Bld: 101 mg/dL — ABNORMAL HIGH (ref 65–99)
Potassium: 3.5 mmol/L (ref 3.5–5.3)
Sodium: 141 mmol/L (ref 135–146)
Total Bilirubin: 0.5 mg/dL (ref 0.2–1.2)
Total Protein: 7.9 g/dL (ref 6.1–8.1)
eGFR: 58 mL/min/{1.73_m2} — ABNORMAL LOW (ref >=60)

## 2021-03-28 LAB — CBC WITH DIFFERENTIAL/PLATELET
Absolute Monocytes: 702 cells/uL (ref 200–950)
Basophils Absolute: 27 cells/uL (ref 0–200)
Basophils Relative: 0.3 %
Eosinophils Absolute: 90 cells/uL (ref 15–500)
Eosinophils Relative: 1 %
HCT: 45.3 % — ABNORMAL HIGH (ref 35.0–45.0)
Hemoglobin: 15.7 g/dL — ABNORMAL HIGH (ref 11.7–15.5)
Lymphs Abs: 3177 cells/uL (ref 850–3900)
MCH: 32.7 pg (ref 27.0–33.0)
MCHC: 34.7 g/dL (ref 32.0–36.0)
MCV: 94.4 fL (ref 80.0–100.0)
MPV: 11.1 fL (ref 7.5–12.5)
Monocytes Relative: 7.8 %
Neutro Abs: 5004 cells/uL (ref 1500–7800)
Neutrophils Relative %: 55.6 %
Platelets: 210 10*3/uL (ref 140–400)
RBC: 4.8 10*6/uL (ref 3.80–5.10)
RDW: 12.3 % (ref 11.0–15.0)
Total Lymphocyte: 35.3 %
WBC: 9 10*3/uL (ref 3.8–10.8)

## 2021-03-28 LAB — LIPID PANEL
Cholesterol: 178 mg/dL (ref <200)
HDL: 47 mg/dL — ABNORMAL LOW (ref >=50)
LDL Cholesterol (Calc): 99 mg/dL (calc)
Non-HDL Cholesterol (Calc): 131 mg/dL (calc) — ABNORMAL HIGH (ref <130)
Total CHOL/HDL Ratio: 3.8 (calc) (ref <5.0)
Triglycerides: 204 mg/dL — ABNORMAL HIGH (ref <150)

## 2021-03-28 LAB — HEMOGLOBIN A1C
Hgb A1c MFr Bld: 5.9 % of total Hgb — ABNORMAL HIGH (ref <5.7)
Mean Plasma Glucose: 123 mg/dL
eAG (mmol/L): 6.8 mmol/L

## 2021-04-18 ENCOUNTER — Encounter: Payer: BC Managed Care – PPO | Admitting: Internal Medicine

## 2021-04-24 NOTE — Progress Notes (Signed)
COMPLETE PHYSICAL EXAM  Assessment and Plan:    Encounter for general adult medical examination with abnormal findings Due Yearly  Hypertension Well controlled with current medications  Monitor blood pressure at home; patient to call if consistently greater than 130/80 Continue DASH diet.   Reminder to go to the ER if any CP, SOB, nausea, dizziness, severe HA, changes vision/speech, left arm numbness and tingling and jaw pain. CBC  Cholesterol Currently at goal; continue with statin  Continue low cholesterol diet and exercise.   Defer lipid today, was checked last month CMP TSH  Other abnormal glucose Continue diet and exercise.  Perform daily foot/skin check, notify office of any concerning changes.  Check A1C q31m; defer today; check CMP for serum glucose, monitor weight trends  Obesity with serious comorbidity Long discussion about weight loss, diet, and exercise Recommended diet heavy in fruits and veggies and low in animal meats, cheeses, and dairy products, appropriate calorie intake Discussed ideal weight for height and initial weight goal is 10 pounds in next 3 months Patient will work on -sticking to new low carb diet, exercising, finding alternatives to walking she can do while weather is cold; video exercise programs suggested Will follow up in 3 months  Vitamin D deficiency Continue Vit D supplementationto maintain a level between 60-100 VitD  Paroxysmal a. 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 Further disposition pending results of labs. Discussed med's effects and SE's.   Over 30 minutes of exam, counseling, chart review, and critical decision making was performed.   Future Appointments  Date Time Provider [place]  04/26/2021  9:00 AM Magda Bernheim, NP [facility] None  04/26/2022  9:00 AM Magda Bernheim, NP [facility] None    ----------------------------------------------------------------------------------------------------------------------  HPI 65 y.o. female  presents for 3 month follow up on hypertension, cholesterol, hx of abnormal glucose, weight and vitamin D deficiency.   BMI is Body mass index is 30.7 kg/m., she has been working on diet and exercise - following a low carb diet. She is trying to get up hourly and drink water. Walking 1-2 miles a day. Would like to get down to 10 lbin the next 3 months.  Wt Readings from Last 3 Encounters:  04/26/21 196 lb (88.9 kg)  10/12/20 191 lb 6.4 oz (86.8 kg)  07/28/20 191 lb 6.4 oz (86.8 kg)   She has history of Afib with RVR, on xarelto, had cardioversion in Jan 2018, now treated by lopressor and Xarelto. EF was 40% via cath 03/2016. She follows with cardiology. No concerns with excess bleeding. Follows with Dr. Doylene Canard. Echo is scheduled for December Her blood pressure has been controlled at home, today their BP is BP: 120/72 BP Readings from Last 3 Encounters:  04/26/21 120/72  03/27/21 120/90  10/12/20 110/86     She does workout. She denies chest pain, shortness of breath, dizziness.   She is on  cholesterol medication (rosuvastatin 40 mg daily) and denies myalgias. Her cholesterol is at goal. The cholesterol last visit was:   Lab Results  Component Value Date   CHOL 178 03/27/2021   HDL 47 (L) 03/27/2021   LDLCALC 99 03/27/2021    TRIG 204 (H) 03/27/2021   CHOLHDL 3.8 03/27/2021    She has been working on diet and exercise for glucose management, and denies increased appetite, nausea, paresthesia of the feet, polydipsia, polyuria, visual disturbances and vomiting. Last A1C in the office was:  Lab Results  Component Value Date   HGBA1C 5.9 (H) 03/27/2021   Patient is on Vitamin D supplement and near goal at recent check:   Lab Results  Component Value Date   VD25OH 53 10/12/2020     GYN scheduled 06/07/21 and mammogram scheduled for 04/28/21. Declines Flu Vaccine today   Current Medications:  Current Outpatient Medications on File Prior to Visit  Medication Sig   Cholecalciferol (VITAMIN D PO) Take 5,000 Units by mouth daily.    diltiazem (CARDIZEM) 60 MG tablet Take 60 mg by mouth 2 (two) times daily.   furosemide (LASIX) 40 MG tablet Take 1 tablet Daily for BP and Fluid Retention / Ankle Swelling   lisinopril (ZESTRIL) 10 MG tablet 10 mg daily.    metoprolol (LOPRESSOR) 50 MG tablet Take 50 mg by mouth 2 (two) times daily.   Multiple Vitamins-Minerals (PRESERVISION AREDS PO) Take by mouth daily.   rivaroxaban (XARELTO) 20 MG TABS tablet Take 20 mg by mouth daily with supper.   rosuvastatin (CRESTOR) 40 MG tablet TAKE 1 TABLET BY MOUTH DAILY FOR CHOLESTEROL   zinc gluconate 50 MG tablet Take 50 mg by mouth daily.   No current facility-administered medications on file prior to visit.     Allergies:  Allergies  Allergen Reactions   Codeine     REACTION: TREMORS/NERVOUS   Penicillins     REACTION: RASH/SWELLING     Medical History:  Past Medical History:  Diagnosis Date   Atrial fibrillation ([place]) 03/22/2016   NEW ONSET    Hyperlipidemia    Hypertension    Pre-diabetes    Vitamin D deficiency    Family history- Reviewed and unchanged Social history- Reviewed and unchanged   Review of Systems:  Review of Systems  Constitutional:  Negative for chills, fever, malaise/fatigue and weight  loss.  HENT:  Negative for congestion, hearing loss, sinus pain, sore throat and tinnitus.   Eyes:  Negative for blurred vision and double vision.  Respiratory:  Negative for cough, hemoptysis, sputum production, shortness of breath and wheezing.   Cardiovascular:  Negative for chest pain, palpitations, orthopnea, claudication and leg swelling.       Ongoing atrial fibrillation  Gastrointestinal:  Negative for abdominal pain, blood in stool, constipation, diarrhea, heartburn, melena, nausea and vomiting.  Genitourinary: Negative.  Negative for dysuria and urgency.  Musculoskeletal:  Positive for joint pain (intermittent bilateral knees with swelling). Negative for back pain, falls, myalgias and neck pain.  Skin:  Negative for rash.  Neurological:  Negative for dizziness, tingling, tremors, sensory change, weakness and headaches.  Endo/Heme/Allergies:  Negative for polydipsia. Does not bruise/bleed easily.  Psychiatric/Behavioral: Negative.  Negative for depression and suicidal ideas. The patient is not nervous/anxious and does not have insomnia.   All other systems reviewed and are negative.  Physical Exam: BP 120/72   Pulse (!) 57   Temp 97.9 F (36.6 C)   Resp 16   Ht 5\' 7"  (1.702  m)   Wt 196 lb (88.9 kg)   SpO2 97%   BMI 30.70 kg/m  Wt Readings from Last 3 Encounters:  04/26/21 196 lb (88.9 kg)  10/12/20 191 lb 6.4 oz (86.8 kg)  07/28/20 191 lb 6.4 oz (86.8 kg)   General Appearance: Well nourished, in no apparent distress. Eyes: PERRLA, EOMs, conjunctiva no swelling or erythema Sinuses: No Frontal/maxillary tenderness ENT/Mouth: Ext aud canals clear, TMs without erythema, bulging. No erythema, swelling, or exudate on post pharynx.  Tonsils not swollen or erythematous. Hearing normal.  Neck: Supple, thyroid normal.  Respiratory: Respiratory effort normal, BS equal bilaterally without rales, rhonchi, wheezing or stridor.  Cardio: Heart sounds irregularly irregular without  audible murmur. Brisk peripheral pulses without edema.  Breast: defer to GYN Pelvic: defer to GYN Abdomen: Soft, + BS.  Non tender, no guarding, rebound, hernias, masses. Lymphatics: Non tender without lymphadenopathy.  Musculoskeletal: Full ROM, 5/5 strength, Normal gait. Medial aspect of knees bilaterally have a swollen area below the patella, nontender Skin: Warm, dry without rashes, lesions, ecchymosis.  Neuro: Cranial nerves intact. No cerebellar symptoms.  Psych: Awake and oriented X 3, normal affect, Insight and Judgment appropriate.  EKG: Atrial fibrillation, no change  Eldene Plocher Gigi Gin, NP 8:57 AM [facility]

## 2021-04-26 ENCOUNTER — Ambulatory Visit (INDEPENDENT_AMBULATORY_CARE_PROVIDER_SITE_OTHER): Payer: BC Managed Care – PPO | Admitting: Nurse Practitioner

## 2021-04-26 ENCOUNTER — Other Ambulatory Visit: Payer: Self-pay

## 2021-04-26 ENCOUNTER — Encounter: Payer: Self-pay | Admitting: Nurse Practitioner

## 2021-04-26 VITALS — BP 120/72 | HR 57 | Temp 97.9°F | Resp 16 | Ht 67.0 in | Wt 196.0 lb

## 2021-04-26 DIAGNOSIS — Z13220 Encounter for screening for lipoid disorders: Secondary | ICD-10-CM | POA: Diagnosis not present

## 2021-04-26 DIAGNOSIS — M25461 Effusion, right knee: Secondary | ICD-10-CM

## 2021-04-26 DIAGNOSIS — R7309 Other abnormal glucose: Secondary | ICD-10-CM

## 2021-04-26 DIAGNOSIS — D6859 Other primary thrombophilia: Secondary | ICD-10-CM

## 2021-04-26 DIAGNOSIS — Z136 Encounter for screening for cardiovascular disorders: Secondary | ICD-10-CM | POA: Diagnosis not present

## 2021-04-26 DIAGNOSIS — E559 Vitamin D deficiency, unspecified: Secondary | ICD-10-CM

## 2021-04-26 DIAGNOSIS — I480 Paroxysmal atrial fibrillation: Secondary | ICD-10-CM

## 2021-04-26 DIAGNOSIS — Z6830 Body mass index (BMI) 30.0-30.9, adult: Secondary | ICD-10-CM

## 2021-04-26 DIAGNOSIS — E663 Overweight: Secondary | ICD-10-CM

## 2021-04-26 DIAGNOSIS — Z1389 Encounter for screening for other disorder: Secondary | ICD-10-CM

## 2021-04-26 DIAGNOSIS — Z79899 Other long term (current) drug therapy: Secondary | ICD-10-CM

## 2021-04-26 DIAGNOSIS — Z0001 Encounter for general adult medical examination with abnormal findings: Secondary | ICD-10-CM

## 2021-04-26 DIAGNOSIS — Z0000 Encounter for general adult medical examination without abnormal findings: Secondary | ICD-10-CM

## 2021-04-26 DIAGNOSIS — E6609 Other obesity due to excess calories: Secondary | ICD-10-CM

## 2021-04-26 DIAGNOSIS — I10 Essential (primary) hypertension: Secondary | ICD-10-CM | POA: Diagnosis not present

## 2021-04-26 DIAGNOSIS — E782 Mixed hyperlipidemia: Secondary | ICD-10-CM

## 2021-04-26 NOTE — Patient Instructions (Signed)

## 2021-04-27 LAB — COMPLETE METABOLIC PANEL WITH GFR
AG Ratio: 1.6 (calc) (ref 1.0–2.5)
ALT: 26 U/L (ref 6–29)
AST: 30 U/L (ref 10–35)
Albumin: 4.4 g/dL (ref 3.6–5.1)
Alkaline phosphatase (APISO): 129 U/L (ref 37–153)
BUN/Creatinine Ratio: 19 (calc) (ref 6–22)
BUN: 20 mg/dL (ref 7–25)
CO2: 28 mmol/L (ref 20–32)
Calcium: 10.2 mg/dL (ref 8.6–10.4)
Chloride: 105 mmol/L (ref 98–110)
Creat: 1.06 mg/dL — ABNORMAL HIGH (ref 0.50–1.05)
Globulin: 2.7 g/dL (calc) (ref 1.9–3.7)
Glucose, Bld: 104 mg/dL — ABNORMAL HIGH (ref 65–99)
Potassium: 4 mmol/L (ref 3.5–5.3)
Sodium: 145 mmol/L (ref 135–146)
Total Bilirubin: 0.4 mg/dL (ref 0.2–1.2)
Total Protein: 7.1 g/dL (ref 6.1–8.1)
eGFR: 58 mL/min/{1.73_m2} — ABNORMAL LOW (ref >=60)

## 2021-04-27 LAB — URINALYSIS, ROUTINE W REFLEX MICROSCOPIC
Bilirubin Urine: NEGATIVE
Glucose, UA: NEGATIVE
Hgb urine dipstick: NEGATIVE
Ketones, ur: NEGATIVE
Leukocytes,Ua: NEGATIVE
Nitrite: NEGATIVE
Protein, ur: NEGATIVE
Specific Gravity, Urine: 1.014 (ref 1.001–1.035)
pH: <=5 (ref 5.0–8.0)

## 2021-04-27 LAB — CBC WITH DIFFERENTIAL/PLATELET
Absolute Monocytes: 627 cells/uL (ref 200–950)
Basophils Absolute: 22 cells/uL (ref 0–200)
Basophils Relative: 0.4 %
Eosinophils Absolute: 99 cells/uL (ref 15–500)
Eosinophils Relative: 1.8 %
HCT: 42.9 % (ref 35.0–45.0)
Hemoglobin: 14.4 g/dL (ref 11.7–15.5)
Lymphs Abs: 1859 cells/uL (ref 850–3900)
MCH: 32.4 pg (ref 27.0–33.0)
MCHC: 33.6 g/dL (ref 32.0–36.0)
MCV: 96.6 fL (ref 80.0–100.0)
MPV: 10.9 fL (ref 7.5–12.5)
Monocytes Relative: 11.4 %
Neutro Abs: 2893 cells/uL (ref 1500–7800)
Neutrophils Relative %: 52.6 %
Platelets: 207 10*3/uL (ref 140–400)
RBC: 4.44 10*6/uL (ref 3.80–5.10)
RDW: 12.7 % (ref 11.0–15.0)
Total Lymphocyte: 33.8 %
WBC: 5.5 10*3/uL (ref 3.8–10.8)

## 2021-04-27 LAB — TSH: TSH: 2.57 mIU/L (ref 0.40–4.50)

## 2021-04-27 LAB — MICROALBUMIN / CREATININE URINE RATIO
Creatinine, Urine: 97 mg/dL (ref 20–275)
Microalb Creat Ratio: 13 mcg/mg creat (ref <30)
Microalb, Ur: 1.3 mg/dL

## 2021-04-27 LAB — VITAMIN D 25 HYDROXY (VIT D DEFICIENCY, FRACTURES): Vit D, 25-Hydroxy: 51 ng/mL (ref 30–100)

## 2021-04-27 LAB — MAGNESIUM: Magnesium: 2.1 mg/dL (ref 1.5–2.5)

## 2021-04-28 DIAGNOSIS — Z1231 Encounter for screening mammogram for malignant neoplasm of breast: Secondary | ICD-10-CM | POA: Diagnosis not present

## 2021-05-01 ENCOUNTER — Encounter: Payer: Self-pay | Admitting: Internal Medicine

## 2021-05-29 DIAGNOSIS — I342 Nonrheumatic mitral (valve) stenosis: Secondary | ICD-10-CM | POA: Diagnosis not present

## 2021-05-29 DIAGNOSIS — I5022 Chronic systolic (congestive) heart failure: Secondary | ICD-10-CM | POA: Diagnosis not present

## 2021-05-29 DIAGNOSIS — I4821 Permanent atrial fibrillation: Secondary | ICD-10-CM | POA: Diagnosis not present

## 2021-06-07 DIAGNOSIS — Z01419 Encounter for gynecological examination (general) (routine) without abnormal findings: Secondary | ICD-10-CM | POA: Diagnosis not present

## 2021-08-26 ENCOUNTER — Other Ambulatory Visit: Payer: Self-pay | Admitting: Nurse Practitioner

## 2021-09-05 IMAGING — DX DG KNEE COMPLETE 4+V*L*
4 series · 4 of 4 positions shown · non-contrast
Comparison: None.

CLINICAL DATA: Left knee pain and swelling.  No known injury.

EXAM:
LEFT KNEE - COMPLETE 4+ VIEW

[dg knee complete 4 views left (1 of 4)]
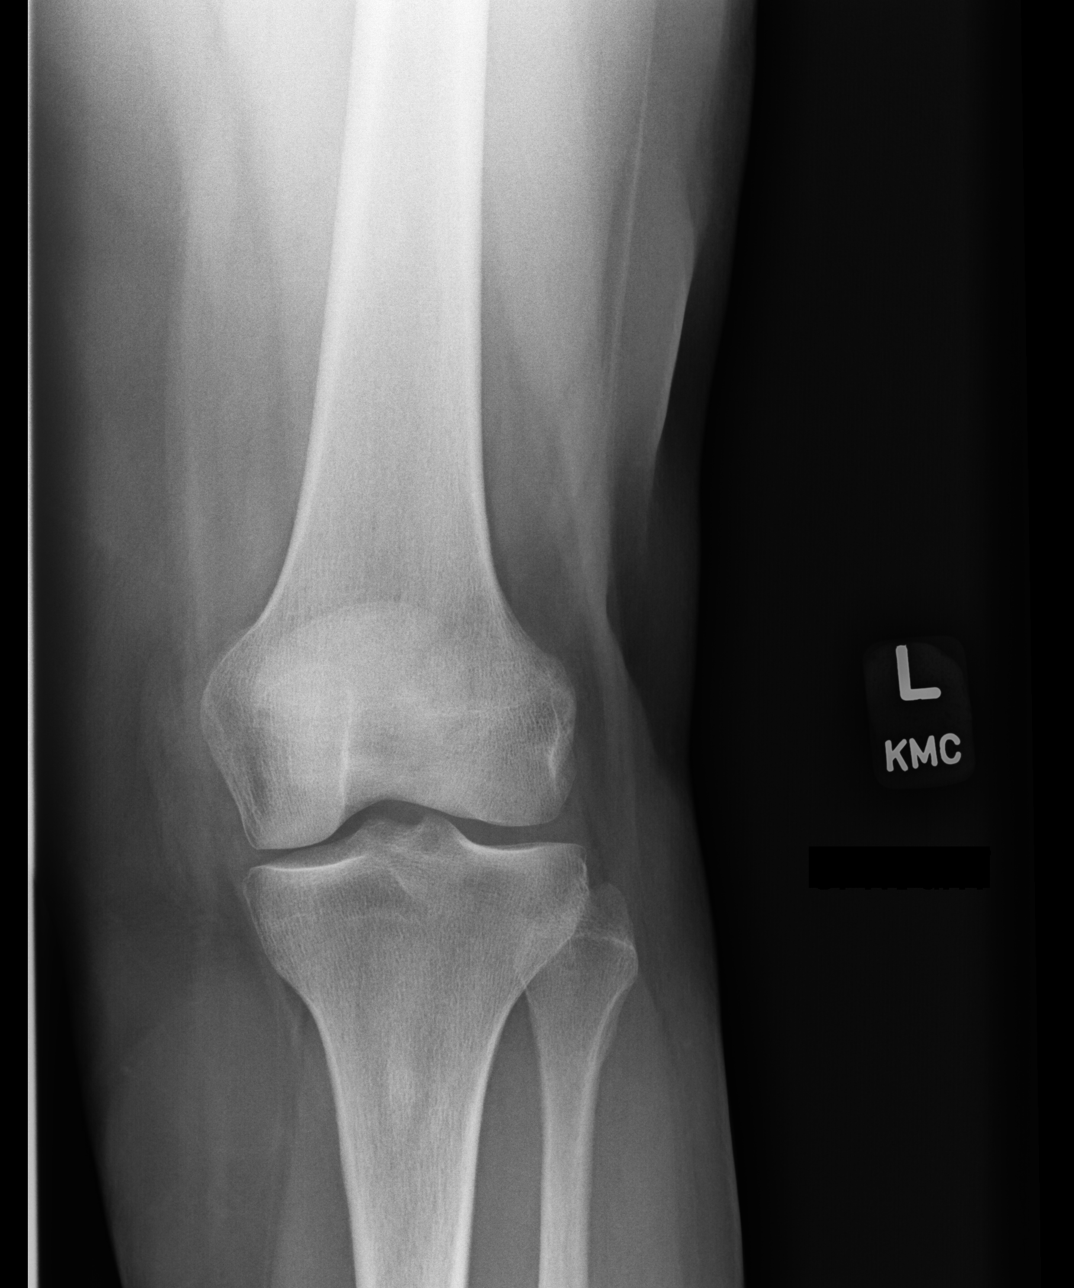

[dg knee complete 4 views left (2 of 4)]
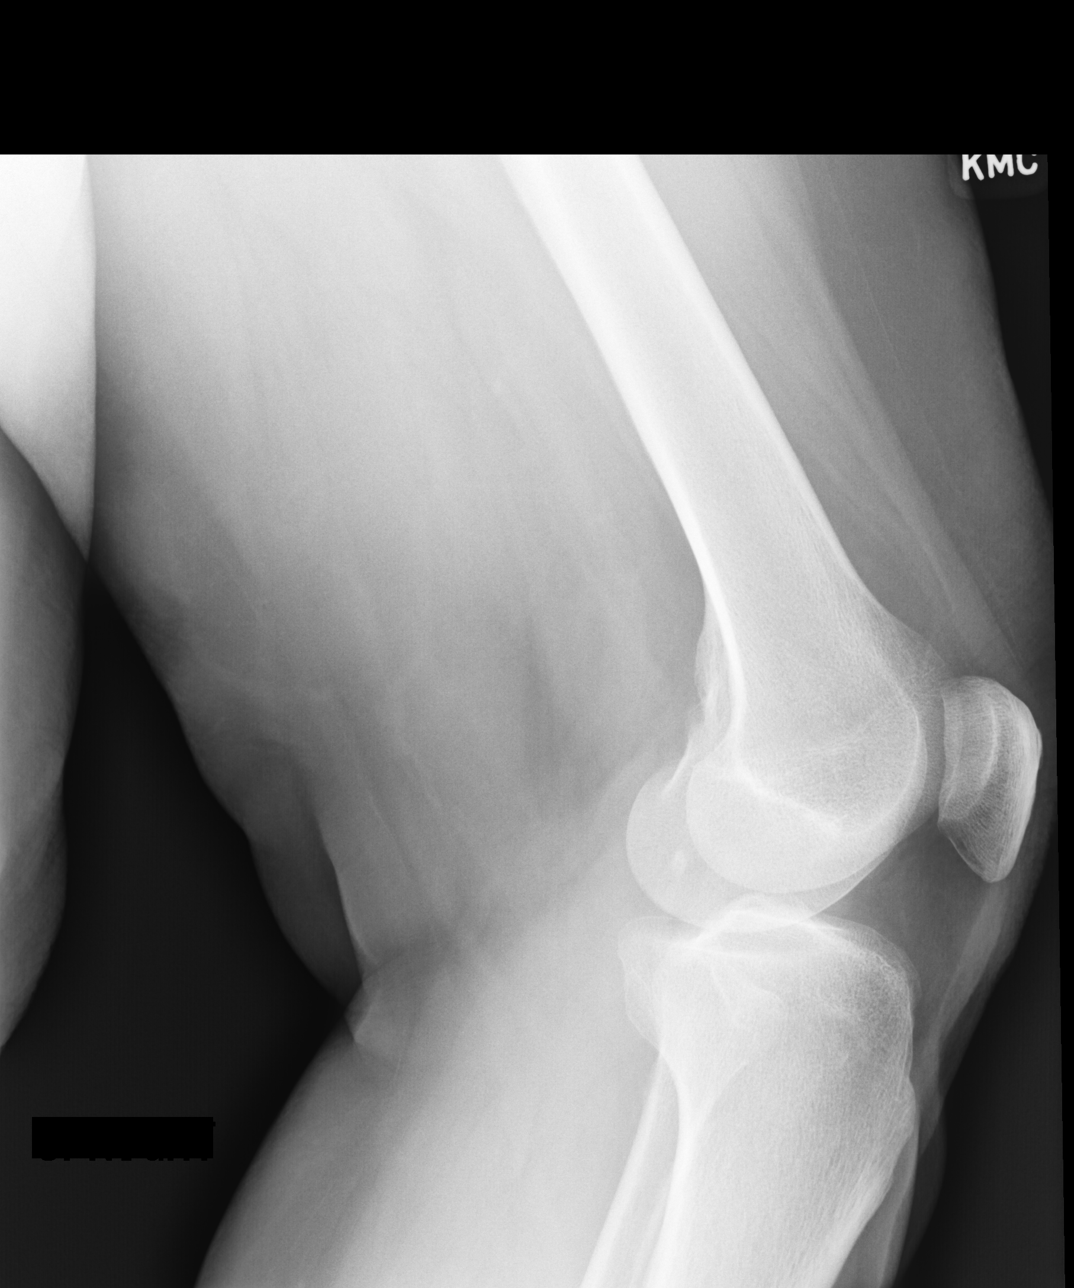

[dg knee complete 4 views left (3 of 4)]
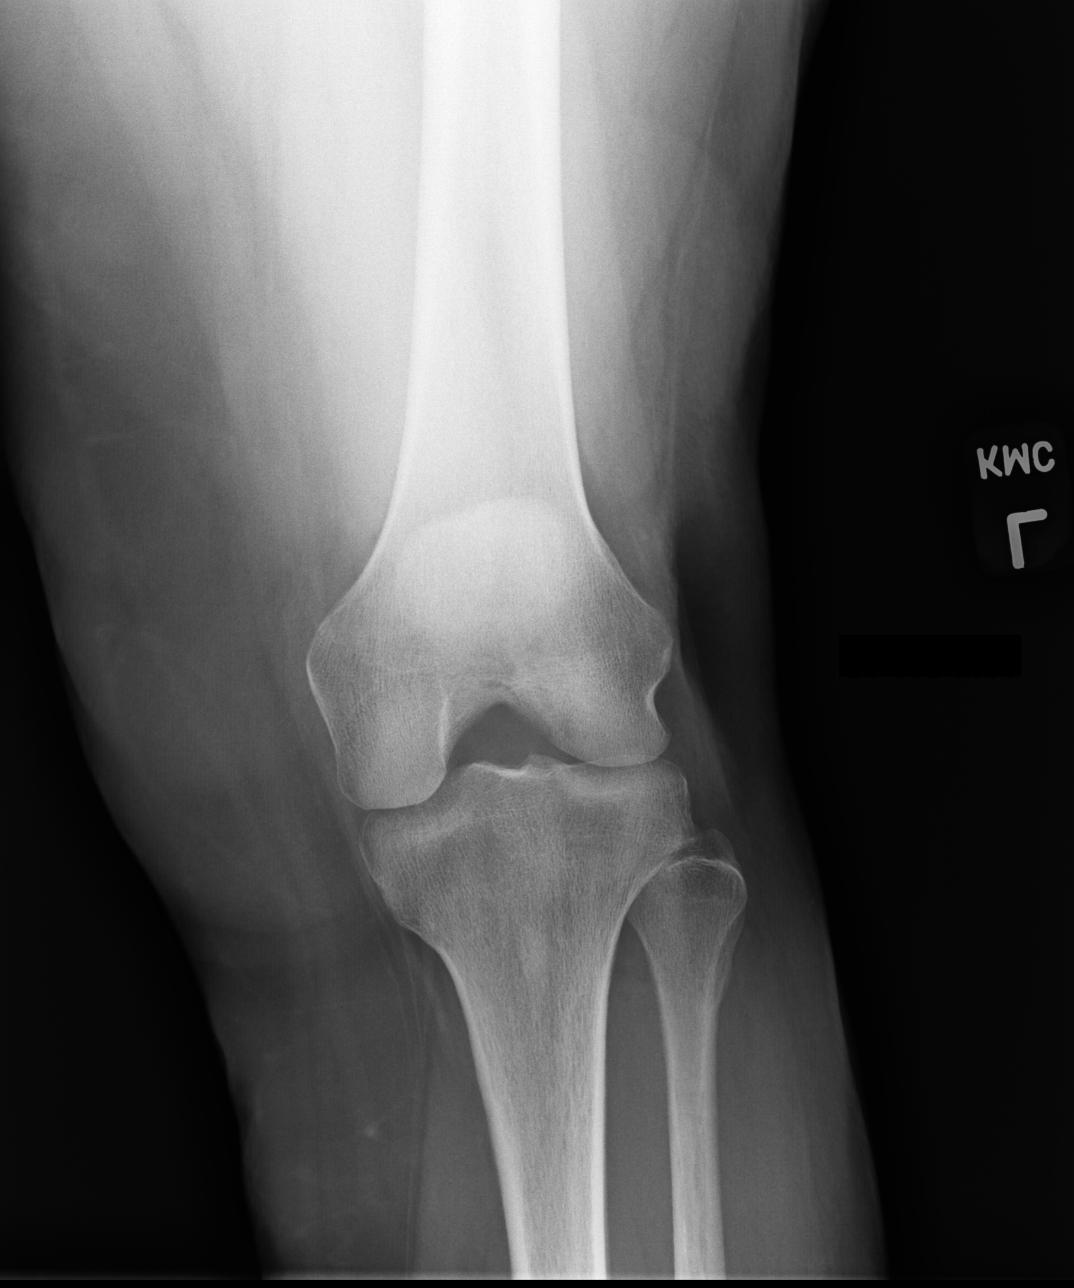

[dg knee complete 4 views left (4 of 4)]
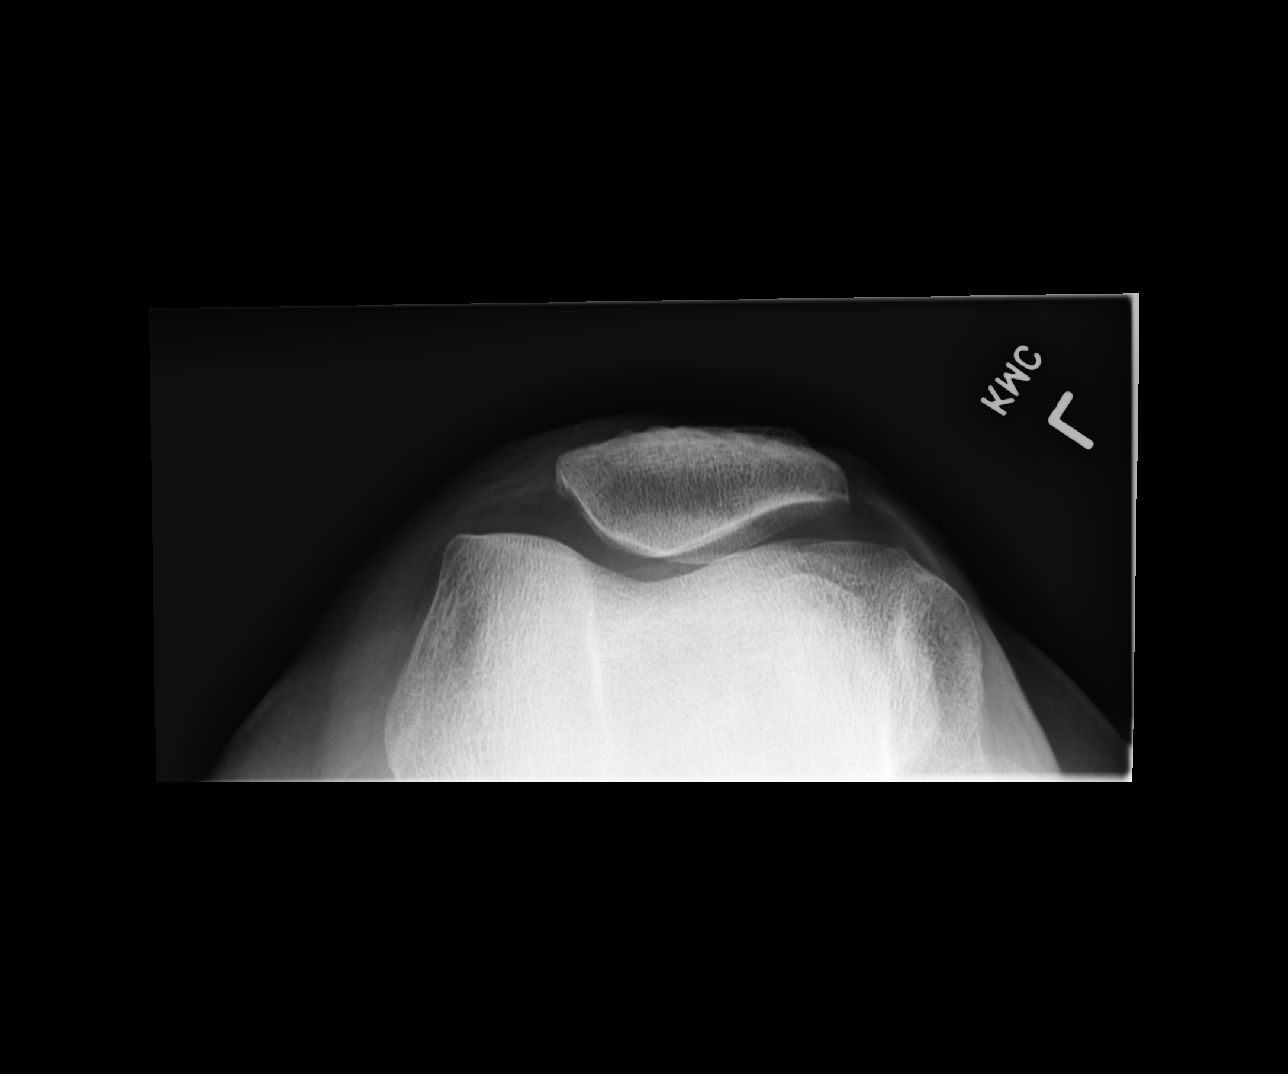

[4 of 4 positions shown; findings below may reference images not displayed]

FINDINGS: No evidence of fracture, dislocation, or joint effusion. No evidence
of arthropathy or other focal bone abnormality. Soft tissues are
unremarkable.
IMPRESSION: Negative.

## 2021-10-09 ENCOUNTER — Other Ambulatory Visit: Payer: Self-pay | Admitting: Internal Medicine

## 2021-10-30 DIAGNOSIS — L293 Anogenital pruritus, unspecified: Secondary | ICD-10-CM | POA: Diagnosis not present

## 2021-10-30 NOTE — Progress Notes (Signed)
?FOLLOW UP 6 MONTH     Call [phone] with results and send copy to Dr. Doylene Canard ? ?Assessment and Plan:  ? ?Hypertension ?Well controlled with current medications  ?Monitor blood pressure at home; patient to call if consistently greater than 130/80 ?Continue DASH diet.   ?Reminder to go to the ER if any CP, SOB, nausea, dizziness, severe HA, changes vision/speech, left arm numbness and tingling and jaw pain ?-CBC. ? ?Hyperlipademia ?Currently at goal; continue with statin  ?Continue low cholesterol diet and exercise.   ?Check lipid panel.  ? ?Other abnormal glucose (prediabetes)  ?Continue diet and exercise.  ?Perform daily foot/skin check, notify office of any concerning changes.  ?Check A1C q22m defer today; check CMP for serum glucose, monitor weight trends ? ?Overweight BMI (25.0 - 29.0) ?Long discussion about weight loss, diet, and exercise ?Recommended diet heavy in fruits and veggies and low in animal meats, cheeses, and dairy products, appropriate calorie intake ?Discussed ideal weight for height and initial weight goal (170 lb) ?Patient will work on -sticking to new low carb diet, exercising, finding alternatives to walking she can do while weather is cold; video exercise programs suggested ?Will follow up in 3 months ? ?[place]mal A Fib ([place] ?Rate controlled at this time ?No concerns with excessive bleeding, no falls ?Continue medication: Xarelto 270m?      Continue follow up with cardiology, reports requested, patient reported recent repeat ECHO ? ?[facility] ([facility] ?R/T A Fib; continue xarelto unless bleeding contraindications ?Discussed S & S of bleeding ? ?Vitamin D deficiency ?- Continue Vit D supplementation to maintain value in therapeutic level of 60-100  ? ?Medication management ?Continued ? ?[place] III ([place]?Increase fluids, avoid NSAIDS, monitor sugars, will monitor ?- CMP ? ? ?Continue diet and meds as discussed. Further disposition pending results of labs. Discussed med's effects  and SE's.   ?Over 30 minutes of face to face exam, counseling, chart review, and critical decision making was performed.  ? ?Future Appointments  ?Date Time Provider [place]?04/26/2022  9:00 AM Brettney Ficken, DaTownsend RogerNP [facility] None  ? ? ?---------------------------------------------------------------------------------------------------------------------- ? ?HPI ?[age].o. female  presents for 3 month follow up on HTN, HLD, Afib, hx of abnormal glucose, weight and vitamin D deficiency.  ? ?BMI is Body mass index is 29.85 kg/m?., she has been back to working on diet and exercise - last week restarted following a low carb diet and walking program. 2-3 miles of walking 5-6 days a week. Has stopped sweets ?Wt Readings from Last 3 Encounters:  ?10/31/21 190 lb 9.6 oz (86.5 kg)  ?04/26/21 196 lb (88.9 kg)  ?10/12/20 191 lb 6.4 oz (86.8 kg)  ? ?She has history of Afib with RVR, on xarelto, had cardioversion in Jan 2018, now treated by lopressor and diltiazem. EF was 40% via cath 03/2016. She follows with cardiology. No concerns with excess bleeding. Follows with Dr. KaDoylene CanardHas upcoming scheduled.   ? ? Her blood pressure has been controlled at home, today their BP is BP: 112/82  ?BP Readings from Last 3 Encounters:  ?10/31/21 112/82  ?04/26/21 120/72  ?03/27/21 120/90  ?She does workout. She denies chest pain, shortness of breath, dizziness. ? ? ? She is on cholesterol medication (rosuvastatin 40 mg daily) and denies myalgias. Her cholesterol is at goal. The cholesterol last visit was:   ?Lab Results  ?Component Value Date  ? CHOL 178 03/27/2021  ? HDL 47 (L) 03/27/2021  ? LDM[place]9 03/27/2021  ?  TRIG 204 (H) 03/27/2021  ? CHOLHDL 3.8 03/27/2021  ? ? She has been working on diet and exercise for glucose management, and denies increased appetite, nausea, paresthesia of the feet, polydipsia, polyuria, visual disturbances and vomiting. Last A1C in the office was:  ?Lab Results  ?Component Value Date  ? HGBA1C 5.9 (H)  03/27/2021  ? ? She has CKD III ?Lab Results  ?Component Value Date  ? EGFR 58 (L) 04/26/2021  ?  ?Patient is on Vitamin D supplement and near goal at recent check:   ?Lab Results  ?Component Value Date  ? VD25OH 51 04/26/2021  ?   ? ? ? ?Current Medications:  ?Current Outpatient Medications on File Prior to Visit  ?Medication Sig  ? Cholecalciferol (VITAMIN D PO) Take 5,000 Units by mouth daily.   ? diltiazem (CARDIZEM) 60 MG tablet Take 60 mg by mouth 2 (two) times daily.  ? furosemide (LASIX) 40 MG tablet TAKE 1 TABLET BY MOUTH DAILY FOR BLOOD PRESSURE AND FLUID RETENTION OR ANKLE SWELLING  ? lisinopril (ZESTRIL) 10 MG tablet 10 mg daily.   ? metoprolol (LOPRESSOR) 50 MG tablet Take 50 mg by mouth 2 (two) times daily.  ? Multiple Vitamins-Minerals (PRESERVISION AREDS PO) Take by mouth daily.  ? rivaroxaban (XARELTO) 20 MG TABS tablet Take 20 mg by mouth daily with supper.  ? rosuvastatin (CRESTOR) 40 MG tablet TAKE 1 TABLET BY MOUTH DAILY FOR CHOLESTEROL  ? zinc gluconate 50 MG tablet Take 50 mg by mouth daily.  ? ?No current facility-administered medications on file prior to visit.  ? ? ? ?Allergies:  ?Allergies  ?Allergen Reactions  ? Codeine   ?  REACTION: TREMORS/NERVOUS  ? Penicillins   ?  REACTION: RASH/SWELLING  ?  ? ?Medical History:  ?Past Medical History:  ?Diagnosis Date  ? Atrial fibrillation ([place]) 03/22/2016  ? NEW ONSET   ? Hyperlipidemia   ? Hypertension   ? Pre-diabetes   ? Vitamin D deficiency   ? ?Family history- Reviewed and unchanged ?Social history- Reviewed and unchanged ? ?[facility] Dr Philis Pique ?Mammogram in office [place] for 05/2021 ? ?Review of Systems:  ?Review of Systems  ?Constitutional:  Negative for malaise/fatigue and weight loss.  ?HENT:  Negative for hearing loss and tinnitus.   ?Eyes:  Negative for blurred vision and double vision.  ?Respiratory:  Negative for cough, shortness of breath and wheezing.   ?Cardiovascular:  Negative for chest pain, palpitations, orthopnea,  claudication and leg swelling.  ?Gastrointestinal:  Negative for abdominal pain, blood in stool, constipation, diarrhea, heartburn, melena, nausea and vomiting.  ?Genitourinary: Negative.   ?Musculoskeletal:  Negative for joint pain and myalgias.  ?Skin:  Negative for rash.  ?Neurological:  Negative for dizziness, tingling, sensory change, weakness and headaches.  ?Endo/Heme/Allergies:  Negative for polydipsia.  ?Psychiatric/Behavioral: Negative.    ?All other systems reviewed and are negative. ? ?Physical Exam: ?BP 112/82   Pulse 83   Temp 97.7 ?F (36.5 ?C)   Wt 190 lb 9.6 oz (86.5 kg)   SpO2 96%   BMI 29.85 kg/m?  ?Wt Readings from Last 3 Encounters:  ?10/31/21 190 lb 9.6 oz (86.5 kg)  ?04/26/21 196 lb (88.9 kg)  ?10/12/20 191 lb 6.4 oz (86.8 kg)  ? ?General Appearance: Well nourished, in no apparent distress. ?Eyes: PERRLA, EOMs, conjunctiva no swelling or erythema ?Sinuses: No Frontal/maxillary tenderness ?ENT/Mouth: Ext aud canals clear, TMs without erythema, bulging. No erythema, swelling, or exudate on post pharynx.  Tonsils not swollen or  erythematous. Hearing normal.  ?Neck: Supple, thyroid normal.  ?Respiratory: Respiratory effort normal, BS equal bilaterally without rales, rhonchi, wheezing or stridor.  ?Cardio: Heart sounds irregularly irregular without audible murmur. Brisk peripheral pulses without edema.  ?Abdomen: Soft, + BS.  Non tender, no guarding, rebound, hernias, masses. ?Lymphatics: Non tender without lymphadenopathy.  ?Musculoskeletal: Full ROM, 5/5 strength, Normal gait ?Skin: Warm, dry without rashes, lesions, ecchymosis.  ?Neuro: Cranial nerves intact. No cerebellar symptoms.  ?Psych: Awake and oriented X 3, normal affect, Insight and Judgment appropriate.  ? ? ? ? ? ?Tajah Schreiner Kathyrn Drown, NP ?9:50 AM ?[facility] ? ?

## 2021-10-31 ENCOUNTER — Ambulatory Visit: Payer: BC Managed Care – PPO | Admitting: Nurse Practitioner

## 2021-10-31 ENCOUNTER — Encounter: Payer: Self-pay | Admitting: Nurse Practitioner

## 2021-10-31 VITALS — BP 112/82 | HR 83 | Temp 97.7°F | Wt 190.6 lb

## 2021-10-31 DIAGNOSIS — N183 Chronic kidney disease, stage 3 unspecified: Secondary | ICD-10-CM

## 2021-10-31 DIAGNOSIS — D6859 Other primary thrombophilia: Secondary | ICD-10-CM | POA: Diagnosis not present

## 2021-10-31 DIAGNOSIS — I10 Essential (primary) hypertension: Secondary | ICD-10-CM | POA: Diagnosis not present

## 2021-10-31 DIAGNOSIS — Z79899 Other long term (current) drug therapy: Secondary | ICD-10-CM

## 2021-10-31 DIAGNOSIS — E782 Mixed hyperlipidemia: Secondary | ICD-10-CM | POA: Diagnosis not present

## 2021-10-31 DIAGNOSIS — R7309 Other abnormal glucose: Secondary | ICD-10-CM

## 2021-10-31 DIAGNOSIS — I480 Paroxysmal atrial fibrillation: Secondary | ICD-10-CM | POA: Diagnosis not present

## 2021-10-31 DIAGNOSIS — E559 Vitamin D deficiency, unspecified: Secondary | ICD-10-CM

## 2021-10-31 DIAGNOSIS — E663 Overweight: Secondary | ICD-10-CM

## 2021-11-01 ENCOUNTER — Other Ambulatory Visit: Payer: Self-pay | Admitting: Nurse Practitioner

## 2021-11-01 DIAGNOSIS — E875 Hyperkalemia: Secondary | ICD-10-CM

## 2021-11-01 LAB — CBC WITH DIFFERENTIAL/PLATELET
Absolute Monocytes: 768 cells/uL (ref 200–950)
Basophils Absolute: 30 cells/uL (ref 0–200)
Basophils Relative: 0.4 %
Eosinophils Absolute: 91 cells/uL (ref 15–500)
Eosinophils Relative: 1.2 %
HCT: 44.4 % (ref 35.0–45.0)
Hemoglobin: 15 g/dL (ref 11.7–15.5)
Lymphs Abs: 2318 cells/uL (ref 850–3900)
MCH: 32.7 pg (ref 27.0–33.0)
MCHC: 33.8 g/dL (ref 32.0–36.0)
MCV: 96.7 fL (ref 80.0–100.0)
MPV: 11.2 fL (ref 7.5–12.5)
Monocytes Relative: 10.1 %
Neutro Abs: 4393 cells/uL (ref 1500–7800)
Neutrophils Relative %: 57.8 %
Platelets: 198 10*3/uL (ref 140–400)
RBC: 4.59 10*6/uL (ref 3.80–5.10)
RDW: 12.3 % (ref 11.0–15.0)
Total Lymphocyte: 30.5 %
WBC: 7.6 10*3/uL (ref 3.8–10.8)

## 2021-11-01 LAB — HEMOGLOBIN A1C
Hgb A1c MFr Bld: 6.1 % of total Hgb — ABNORMAL HIGH (ref <5.7)
Mean Plasma Glucose: 128 mg/dL
eAG (mmol/L): 7.1 mmol/L

## 2021-11-01 LAB — COMPLETE METABOLIC PANEL WITH GFR
AG Ratio: 1.5 (calc) (ref 1.0–2.5)
ALT: 26 U/L (ref 6–29)
AST: 30 U/L (ref 10–35)
Albumin: 4.7 g/dL (ref 3.6–5.1)
Alkaline phosphatase (APISO): 132 U/L (ref 37–153)
BUN/Creatinine Ratio: 20 (calc) (ref 6–22)
BUN: 24 mg/dL (ref 7–25)
CO2: 29 mmol/L (ref 20–32)
Calcium: 10.6 mg/dL — ABNORMAL HIGH (ref 8.6–10.4)
Chloride: 102 mmol/L (ref 98–110)
Creat: 1.2 mg/dL — ABNORMAL HIGH (ref 0.50–1.05)
Globulin: 3.2 g/dL (calc) (ref 1.9–3.7)
Glucose, Bld: 104 mg/dL — ABNORMAL HIGH (ref 65–99)
Potassium: 5.7 mmol/L — ABNORMAL HIGH (ref 3.5–5.3)
Sodium: 143 mmol/L (ref 135–146)
Total Bilirubin: 0.6 mg/dL (ref 0.2–1.2)
Total Protein: 7.9 g/dL (ref 6.1–8.1)
eGFR: 50 mL/min/{1.73_m2} — ABNORMAL LOW (ref >=60)

## 2021-11-01 LAB — LIPID PANEL
Cholesterol: 185 mg/dL (ref <200)
HDL: 48 mg/dL — ABNORMAL LOW (ref >=50)
LDL Cholesterol (Calc): 104 mg/dL (calc) — ABNORMAL HIGH
Non-HDL Cholesterol (Calc): 137 mg/dL (calc) — ABNORMAL HIGH (ref <130)
Total CHOL/HDL Ratio: 3.9 (calc) (ref <5.0)
Triglycerides: 210 mg/dL — ABNORMAL HIGH (ref <150)

## 2021-11-01 NOTE — Progress Notes (Signed)
Lmom for patient to call the office and schedule 2 week nv to re-check BMP per Annetta

## 2021-11-14 ENCOUNTER — Ambulatory Visit: Payer: BC Managed Care – PPO

## 2021-11-14 DIAGNOSIS — E875 Hyperkalemia: Secondary | ICD-10-CM

## 2021-11-14 DIAGNOSIS — E785 Hyperlipidemia, unspecified: Secondary | ICD-10-CM | POA: Diagnosis not present

## 2021-11-14 NOTE — Progress Notes (Signed)
The patient came in today for repeat labs BMP and reports that she is not taking any NSAIDs and is drinking water daily.

## 2021-11-15 LAB — BASIC METABOLIC PANEL WITH GFR
BUN/Creatinine Ratio: 14 (calc) (ref 6–22)
BUN: 15 mg/dL (ref 7–25)
CO2: 26 mmol/L (ref 20–32)
Calcium: 9.9 mg/dL (ref 8.6–10.4)
Chloride: 104 mmol/L (ref 98–110)
Creat: 1.07 mg/dL — ABNORMAL HIGH (ref 0.50–1.05)
Glucose, Bld: 100 mg/dL — ABNORMAL HIGH (ref 65–99)
Potassium: 4.8 mmol/L (ref 3.5–5.3)
Sodium: 140 mmol/L (ref 135–146)
eGFR: 57 mL/min/{1.73_m2} — ABNORMAL LOW (ref >=60)

## 2021-12-27 ENCOUNTER — Telehealth: Payer: Self-pay | Admitting: Nurse Practitioner

## 2021-12-27 ENCOUNTER — Other Ambulatory Visit: Payer: Self-pay | Admitting: Nurse Practitioner

## 2021-12-27 NOTE — Telephone Encounter (Signed)
I have sent in a refill of the medication

## 2021-12-27 NOTE — Telephone Encounter (Signed)
Patient contacted the office concerned about her Diltiazem. Cardiologist put her on this medication due to A-fib and she only has 2 pills left and is going out of town next week. She states that she has tried to contact her cardiologist for the past 2 weeks to get a refill and she cannot get in touch with anyone from their office. Her pharmacy is supposed to be sending [place] a refill request so that she can get her medication. Will you fill it so she doesn't miss any doses? -e welch

## 2022-01-24 DIAGNOSIS — I10 Essential (primary) hypertension: Secondary | ICD-10-CM | POA: Diagnosis not present

## 2022-01-24 DIAGNOSIS — I4821 Permanent atrial fibrillation: Secondary | ICD-10-CM | POA: Diagnosis not present

## 2022-01-24 DIAGNOSIS — I342 Nonrheumatic mitral (valve) stenosis: Secondary | ICD-10-CM | POA: Diagnosis not present

## 2022-01-24 DIAGNOSIS — I5022 Chronic systolic (congestive) heart failure: Secondary | ICD-10-CM | POA: Diagnosis not present

## 2022-04-25 DIAGNOSIS — I342 Nonrheumatic mitral (valve) stenosis: Secondary | ICD-10-CM | POA: Diagnosis not present

## 2022-04-25 DIAGNOSIS — I4821 Permanent atrial fibrillation: Secondary | ICD-10-CM | POA: Diagnosis not present

## 2022-04-25 DIAGNOSIS — I5022 Chronic systolic (congestive) heart failure: Secondary | ICD-10-CM | POA: Diagnosis not present

## 2022-04-25 DIAGNOSIS — I10 Essential (primary) hypertension: Secondary | ICD-10-CM | POA: Diagnosis not present

## 2022-04-25 NOTE — Progress Notes (Unsigned)
COMPLETE PHYSICAL EXAM  Assessment and Plan:    Encounter for general adult medical examination with abnormal findings Due Yearly  Hypertension Well controlled with current medications  Monitor blood pressure at home; patient to call if consistently greater than 130/80 Continue DASH diet.   Reminder to go to the ER if any CP, SOB, nausea, dizziness, severe HA, changes vision/speech, left arm numbness and tingling and jaw pain. CBC  Cholesterol Currently at goal; continue with statin  Continue low cholesterol diet and exercise.   Defer lipid today, was checked last month CMP TSH  Abnormal glucose Continue diet and exercise.  Perform daily foot/skin check, notify office of any concerning changes.  Check A1C q38m; defer today; check CMP for serum glucose, monitor weight trends  Obesity with serious comorbidity Long discussion about weight loss, diet, and exercise Recommended diet heavy in fruits and veggies and low in animal meats, cheeses, and dairy products, appropriate calorie intake Discussed ideal weight for height and initial weight goal is 10 pounds in next 3 months Patient will work on -sticking to new low carb diet, exercising, finding alternatives to walking she can do while weather is cold; video exercise programs suggested Will follow up in 3 months  Vitamin D deficiency Continue Vit D supplementationto maintain a level between 60-100 VitD  Paroxysmal a. Fib (HCC) Rate not controlled at this time, has echo scheduled for December No concerns with excessive bleeding, no falls Continue medication: xarelto 20 mg QD  and lopressor 50 BID, cardizem 60 mg BID       Continue follow up with cardiology, echo scheduled for next week  Thrombophilia (HCC) R/t a. Fib; continue xarelto unless bleeding contraindications  Bilateral knee swelling Negative xray No pain on exam Continue to monitor and if develops pain that persists will refer to ortho  Medication  Management Magnesium  Screening for hematuria and proteinuria Routine urinalysis with reflex microscopic Microalbumin/creatinine urine ratio  Screening for ischemic heart disease EKG  Screening for AAA - U/S ABD Retroperitoneal LTD  Screening for thyroid disorder - TSH  Continue diet and meds as discussed. Further disposition pending results of labs. Discussed med's effects and SE's.   Over 30 minutes of exam, counseling, chart review, and critical decision making was performed.   Future Appointments  Date Time Provider Department Center  05/03/2022  9:05 AM [facility]  04/30/2023  9:00 AM Raynelle Dick, NP [facility] None    ----------------------------------------------------------------------------------------------------------------------  HPI 66 y.o. female  presents for 3 month follow up on hypertension, cholesterol, hx of abnormal glucose, weight and vitamin D deficiency.   BMI is Body mass index is 32.47 kg/m., she has been working on diet and exercise - following a low carb diet. She is trying to get up hourly and drink water. She has not been walking as much and had 2 vacations. She does have an appointment at [facility] tomorrow   Wt Readings from Last 3 Encounters:  04/26/22 201 lb 3.2 oz (91.3 kg)  11/14/21 195 lb (88.5 kg)  10/31/21 190 lb 9.6 oz (86.5 kg)   She has history of Afib with RVR, on xarelto, had cardioversion in Jan 2018, now treated by lopressor and Xarelto. EF was 40% via cath 03/2016. She follows with cardiology. No concerns with excess bleeding. Follows with Dr. Algie Coffer. Echo is scheduled for December Her blood pressure has been controlled at home, today their BP is BP: 112/80 BP Readings from Last 3 Encounters:  04/26/22  112/80  11/14/21 (!) 150/113  10/31/21 112/82     She does workout. She denies chest pain, shortness of breath, dizziness.   She is on cholesterol medication (rosuvastatin 40 mg daily) and  denies myalgias. Her cholesterol is at goal.  Discussed reducing red meat, dairy, eggs and fried foods.  The cholesterol last visit was:   Lab Results  Component Value Date   CHOL 185 10/31/2021   HDL 48 (L) 10/31/2021   LDLCALC 104 (H) 10/31/2021   TRIG 210 (H) 10/31/2021   CHOLHDL 3.9 10/31/2021    She has been working on diet and exercise for glucose management, and denies increased appetite, nausea, paresthesia of the feet, polydipsia, polyuria, visual disturbances and vomiting. She limits simple carbs. Last A1C in the office was:  Lab Results  Component Value Date   HGBA1C 6.1 (H) 10/31/2021   Patient is on Vitamin D supplement and near goal at recent check:   Lab Results  Component Value Date   VD25OH 51 04/26/2021     GYN scheduled 06/07/21 and mammogram scheduled for 06/22/22.    Current Medications:  Current Outpatient Medications on File Prior to Visit  Medication Sig   Cholecalciferol (VITAMIN D PO) Take 5,000 Units by mouth daily.    diltiazem (CARDIZEM) 60 MG tablet Take 60 mg by mouth 2 (two) times daily.   diltiazem (CARDIZEM) 90 MG tablet TAKE 1 TABLET BY MOUTH TWICE DAILY   furosemide (LASIX) 40 MG tablet TAKE 1 TABLET BY MOUTH DAILY FOR BLOOD PRESSURE AND FLUID RETENTION OR ANKLE SWELLING   lisinopril (ZESTRIL) 10 MG tablet 10 mg daily.    metoprolol (LOPRESSOR) 50 MG tablet Take 50 mg by mouth 2 (two) times daily.   Multiple Vitamins-Minerals (PRESERVISION AREDS PO) Take by mouth daily.   rivaroxaban (XARELTO) 20 MG TABS tablet Take 20 mg by mouth daily with supper.   rosuvastatin (CRESTOR) 40 MG tablet TAKE 1 TABLET BY MOUTH DAILY FOR CHOLESTEROL   zinc gluconate 50 MG tablet Take 50 mg by mouth daily.   nystatin-triamcinolone (MYCOLOG II) cream APPLY TOPICALLY TO THE AFFECTED AREA EVERY MORNING AND EVERY EVENING   No current facility-administered medications on file prior to visit.     Allergies:  Allergies  Allergen Reactions   Codeine     REACTION:  TREMORS/NERVOUS   Penicillins     REACTION: RASH/SWELLING     Medical History:  Past Medical History:  Diagnosis Date   Atrial fibrillation (HCC) 03/22/2016   NEW ONSET    Hyperlipidemia    Hypertension    Pre-diabetes    Vitamin D deficiency    Family history- Reviewed and unchanged Social history- Reviewed and unchanged   Review of Systems:  Review of Systems  Constitutional:  Negative for chills, fever, malaise/fatigue and weight loss.  HENT:  Negative for congestion, hearing loss, sinus pain, sore throat and tinnitus.   Eyes:  Negative for blurred vision and double vision.  Respiratory:  Negative for cough, hemoptysis, sputum production, shortness of breath and wheezing.   Cardiovascular:  Negative for chest pain, palpitations, orthopnea, claudication and leg swelling.       Ongoing atrial fibrillation  Gastrointestinal:  Negative for abdominal pain, blood in stool, constipation, diarrhea, heartburn, melena, nausea and vomiting.  Genitourinary: Negative.  Negative for dysuria and urgency.  Musculoskeletal:  Positive for joint pain (intermittent bilateral knees with swelling). Negative for back pain, falls, myalgias and neck pain.  Skin:  Negative for rash.  Neurological:  Negative  for dizziness, tingling, tremors, sensory change, weakness and headaches.  Endo/Heme/Allergies:  Negative for polydipsia. Does not bruise/bleed easily.  Psychiatric/Behavioral: Negative.  Negative for depression and suicidal ideas. The patient is not nervous/anxious and does not have insomnia.   All other systems reviewed and are negative.   Physical Exam: BP 112/80   Pulse 80   Temp 97.7 F (36.5 C)   Ht 5\' 6"  (1.676 m)   Wt 201 lb 3.2 oz (91.3 kg)   SpO2 96%   BMI 32.47 kg/m  Wt Readings from Last 3 Encounters:  04/26/22 201 lb 3.2 oz (91.3 kg)  11/14/21 195 lb (88.5 kg)  10/31/21 190 lb 9.6 oz (86.5 kg)   General Appearance: Well nourished, in no apparent distress. Eyes: PERRLA,  EOMs, conjunctiva no swelling or erythema Sinuses: No Frontal/maxillary tenderness ENT/Mouth: Ext aud canals clear, TMs without erythema, bulging. No erythema, swelling, or exudate on post pharynx.  Tonsils not swollen or erythematous. Hearing normal.  Neck: Supple, thyroid normal.  Respiratory: Respiratory effort normal, BS equal bilaterally without rales, rhonchi, wheezing or stridor.  Cardio: Heart sounds irregularly irregular without audible murmur. Brisk peripheral pulses without edema.  Breast: defer to GYN Pelvic: defer to GYN Abdomen: Soft, + BS.  Non tender, no guarding, rebound, hernias, masses. Lymphatics: Non tender without lymphadenopathy.  Musculoskeletal: Full ROM, 5/5 strength, Normal gait. Medial aspect of knees bilaterally have a swollen area below the patella, nontender Skin: Warm, dry without rashes, lesions, ecchymosis.  Neuro: Cranial nerves intact. No cerebellar symptoms.  Psych: Awake and oriented X 3, normal affect, Insight and Judgment appropriate.   EKG: Atrial fibrillation, no change AAA: < 3 cm   Aleenah Homen 11/02/21, NP 9:26 AM [facility]

## 2022-04-26 ENCOUNTER — Ambulatory Visit (INDEPENDENT_AMBULATORY_CARE_PROVIDER_SITE_OTHER): Payer: BC Managed Care – PPO | Admitting: Nurse Practitioner

## 2022-04-26 ENCOUNTER — Encounter: Payer: Self-pay | Admitting: Nurse Practitioner

## 2022-04-26 ENCOUNTER — Other Ambulatory Visit (HOSPITAL_COMMUNITY): Payer: Self-pay | Admitting: Cardiovascular Disease

## 2022-04-26 VITALS — BP 112/80 | HR 80 | Temp 97.7°F | Ht 66.0 in | Wt 201.2 lb

## 2022-04-26 DIAGNOSIS — Z79899 Other long term (current) drug therapy: Secondary | ICD-10-CM

## 2022-04-26 DIAGNOSIS — I700 Atherosclerosis of aorta: Secondary | ICD-10-CM | POA: Diagnosis not present

## 2022-04-26 DIAGNOSIS — I480 Paroxysmal atrial fibrillation: Secondary | ICD-10-CM

## 2022-04-26 DIAGNOSIS — E559 Vitamin D deficiency, unspecified: Secondary | ICD-10-CM

## 2022-04-26 DIAGNOSIS — Z131 Encounter for screening for diabetes mellitus: Secondary | ICD-10-CM

## 2022-04-26 DIAGNOSIS — Z0000 Encounter for general adult medical examination without abnormal findings: Secondary | ICD-10-CM

## 2022-04-26 DIAGNOSIS — D6859 Other primary thrombophilia: Secondary | ICD-10-CM

## 2022-04-26 DIAGNOSIS — R7309 Other abnormal glucose: Secondary | ICD-10-CM

## 2022-04-26 DIAGNOSIS — E6609 Other obesity due to excess calories: Secondary | ICD-10-CM

## 2022-04-26 DIAGNOSIS — Z136 Encounter for screening for cardiovascular disorders: Secondary | ICD-10-CM

## 2022-04-26 DIAGNOSIS — Z13220 Encounter for screening for lipoid disorders: Secondary | ICD-10-CM | POA: Diagnosis not present

## 2022-04-26 DIAGNOSIS — I4821 Permanent atrial fibrillation: Secondary | ICD-10-CM

## 2022-04-26 DIAGNOSIS — Z1389 Encounter for screening for other disorder: Secondary | ICD-10-CM

## 2022-04-26 DIAGNOSIS — Z0001 Encounter for general adult medical examination with abnormal findings: Secondary | ICD-10-CM

## 2022-04-26 DIAGNOSIS — N183 Chronic kidney disease, stage 3 unspecified: Secondary | ICD-10-CM

## 2022-04-26 DIAGNOSIS — Z1329 Encounter for screening for other suspected endocrine disorder: Secondary | ICD-10-CM

## 2022-04-26 DIAGNOSIS — E782 Mixed hyperlipidemia: Secondary | ICD-10-CM

## 2022-04-26 DIAGNOSIS — I10 Essential (primary) hypertension: Secondary | ICD-10-CM

## 2022-04-26 NOTE — Patient Instructions (Signed)

## 2022-04-27 LAB — URINALYSIS, ROUTINE W REFLEX MICROSCOPIC
Bilirubin Urine: NEGATIVE
Glucose, UA: NEGATIVE
Hgb urine dipstick: NEGATIVE
Ketones, ur: NEGATIVE
Leukocytes,Ua: NEGATIVE
Nitrite: NEGATIVE
Protein, ur: NEGATIVE
Specific Gravity, Urine: 1.007 (ref 1.001–1.035)
pH: 5.5 (ref 5.0–8.0)

## 2022-04-27 LAB — CBC WITH DIFFERENTIAL/PLATELET
Absolute Monocytes: 781 cells/uL (ref 200–950)
Basophils Absolute: 43 cells/uL (ref 0–200)
Basophils Relative: 0.6 %
Eosinophils Absolute: 78 cells/uL (ref 15–500)
Eosinophils Relative: 1.1 %
HCT: 39.4 % (ref 35.0–45.0)
Hemoglobin: 13.7 g/dL (ref 11.7–15.5)
Lymphs Abs: 1938 cells/uL (ref 850–3900)
MCH: 33 pg (ref 27.0–33.0)
MCHC: 34.8 g/dL (ref 32.0–36.0)
MCV: 94.9 fL (ref 80.0–100.0)
MPV: 11 fL (ref 7.5–12.5)
Monocytes Relative: 11 %
Neutro Abs: 4260 cells/uL (ref 1500–7800)
Neutrophils Relative %: 60 %
Platelets: 172 10*3/uL (ref 140–400)
RBC: 4.15 10*6/uL (ref 3.80–5.10)
RDW: 13 % (ref 11.0–15.0)
Total Lymphocyte: 27.3 %
WBC: 7.1 10*3/uL (ref 3.8–10.8)

## 2022-04-27 LAB — VITAMIN D 25 HYDROXY (VIT D DEFICIENCY, FRACTURES): Vit D, 25-Hydroxy: 53 ng/mL (ref 30–100)

## 2022-04-27 LAB — COMPLETE METABOLIC PANEL WITH GFR
AG Ratio: 1.6 (calc) (ref 1.0–2.5)
ALT: 20 U/L (ref 6–29)
AST: 24 U/L (ref 10–35)
Albumin: 4.6 g/dL (ref 3.6–5.1)
Alkaline phosphatase (APISO): 118 U/L (ref 37–153)
BUN/Creatinine Ratio: 18 (calc) (ref 6–22)
BUN: 20 mg/dL (ref 7–25)
CO2: 30 mmol/L (ref 20–32)
Calcium: 10.2 mg/dL (ref 8.6–10.4)
Chloride: 104 mmol/L (ref 98–110)
Creat: 1.09 mg/dL — ABNORMAL HIGH (ref 0.50–1.05)
Globulin: 2.8 g/dL (calc) (ref 1.9–3.7)
Glucose, Bld: 92 mg/dL (ref 65–99)
Potassium: 4.2 mmol/L (ref 3.5–5.3)
Sodium: 142 mmol/L (ref 135–146)
Total Bilirubin: 0.4 mg/dL (ref 0.2–1.2)
Total Protein: 7.4 g/dL (ref 6.1–8.1)
eGFR: 56 mL/min/{1.73_m2} — ABNORMAL LOW (ref >=60)

## 2022-04-27 LAB — HEMOGLOBIN A1C
Hgb A1c MFr Bld: 6.4 % of total Hgb — ABNORMAL HIGH (ref <5.7)
Mean Plasma Glucose: 137 mg/dL
eAG (mmol/L): 7.6 mmol/L

## 2022-04-27 LAB — LIPID PANEL
Cholesterol: 162 mg/dL (ref <200)
HDL: 56 mg/dL (ref >=50)
LDL Cholesterol (Calc): 81 mg/dL (calc)
Non-HDL Cholesterol (Calc): 106 mg/dL (calc) (ref <130)
Total CHOL/HDL Ratio: 2.9 (calc) (ref <5.0)
Triglycerides: 157 mg/dL — ABNORMAL HIGH (ref <150)

## 2022-04-27 LAB — MICROALBUMIN / CREATININE URINE RATIO
Creatinine, Urine: 20 mg/dL (ref 20–275)
Microalb, Ur: <0.2 mg/dL

## 2022-04-27 LAB — TSH: TSH: 2.11 mIU/L (ref 0.40–4.50)

## 2022-04-27 LAB — MAGNESIUM: Magnesium: 2.1 mg/dL (ref 1.5–2.5)

## 2022-05-03 ENCOUNTER — Ambulatory Visit (HOSPITAL_COMMUNITY)
Admission: RE | Admit: 2022-05-03 | Discharge: 2022-05-03 | Disposition: A | Discharge status: Home / Self Care | Payer: BC Managed Care – PPO | Source: Ambulatory Visit | Attending: Cardiovascular Disease | Admitting: Cardiovascular Disease

## 2022-05-03 DIAGNOSIS — I351 Nonrheumatic aortic (valve) insufficiency: Secondary | ICD-10-CM | POA: Diagnosis not present

## 2022-05-03 DIAGNOSIS — E785 Hyperlipidemia, unspecified: Secondary | ICD-10-CM | POA: Diagnosis not present

## 2022-05-03 DIAGNOSIS — I10 Essential (primary) hypertension: Secondary | ICD-10-CM | POA: Insufficient documentation

## 2022-05-03 DIAGNOSIS — I361 Nonrheumatic tricuspid (valve) insufficiency: Secondary | ICD-10-CM | POA: Diagnosis not present

## 2022-05-03 DIAGNOSIS — I4821 Permanent atrial fibrillation: Secondary | ICD-10-CM | POA: Insufficient documentation

## 2022-05-03 DIAGNOSIS — I342 Nonrheumatic mitral (valve) stenosis: Secondary | ICD-10-CM | POA: Diagnosis not present

## 2022-05-03 LAB — ECHOCARDIOGRAM COMPLETE
AV Mean grad: 1 mmHg
AV Peak grad: 1.3 mmHg
Ao pk vel: 0.57 m/s
Area-P 1/2: 2.93 cm2
S' Lateral: 2.9 cm

## 2022-06-22 DIAGNOSIS — Z01419 Encounter for gynecological examination (general) (routine) without abnormal findings: Secondary | ICD-10-CM | POA: Diagnosis not present

## 2022-06-22 DIAGNOSIS — N760 Acute vaginitis: Secondary | ICD-10-CM | POA: Diagnosis not present

## 2022-06-22 DIAGNOSIS — Z1231 Encounter for screening mammogram for malignant neoplasm of breast: Secondary | ICD-10-CM | POA: Diagnosis not present

## 2022-06-25 ENCOUNTER — Encounter: Payer: Self-pay | Admitting: Internal Medicine

## 2022-07-18 DIAGNOSIS — L292 Pruritus vulvae: Secondary | ICD-10-CM | POA: Diagnosis not present

## 2022-07-24 ENCOUNTER — Other Ambulatory Visit: Payer: Self-pay

## 2022-07-24 MED ORDER — ROSUVASTATIN CALCIUM 40 MG PO TABS: ORAL_TABLET | ORAL | 1 refills | Status: DC

## 2022-09-20 DIAGNOSIS — L292 Pruritus vulvae: Secondary | ICD-10-CM | POA: Diagnosis not present

## 2022-09-20 DIAGNOSIS — Z6826 Body mass index (BMI) 26.0-26.9, adult: Secondary | ICD-10-CM | POA: Diagnosis not present

## 2022-10-23 ENCOUNTER — Other Ambulatory Visit: Payer: Self-pay | Admitting: Nurse Practitioner

## 2022-10-24 NOTE — Progress Notes (Unsigned)
FOLLOW UP 6 MONTH     Call [phone] with results and send copy to Dr. Algie Coffer  Assessment and Plan:   Hypertension Well controlled with current medications  Monitor blood pressure at home; patient to call if consistently greater than 130/80 Continue DASH diet.   Reminder to go to the ER if any CP, SOB, nausea, dizziness, severe HA, changes vision/speech, left arm numbness and tingling and jaw pain -CBC.  Hyperlipidemia Currently at goal; continue with statin  Continue low cholesterol diet and exercise.   Check lipid panel.  - CMP  -TSH  Other abnormal glucose (prediabetes)  Continue diet and exercise.  Perform daily foot/skin check, notify office of any concerning changes.  Check A1C q69m  Overweight BMI (25.0 - 29.0) Long discussion about weight loss, diet, and exercise She is going through [employer] weight loss and has lost 40 pounds Continue high protein, low carb diet Discussed walking with weight vest to build muscle Will follow up in 3 months - TSH  Paroxysmal A Fib (HCC) Rate controlled at this time No concerns with excessive bleeding, no falls Continue medication: Xarelto 20mg        Thrombophilia (HCC) acquired R/T A Fib; continue xarelto unless bleeding contraindications Discussed S & S of bleeding  Vitamin D deficiency - Continue Vit D supplementation to maintain value in therapeutic level of 60-100   Medication management Continued  CKD III (HCC) Increase fluids, avoid NSAIDS, monitor sugars, will monitor - CMP   Continue diet and meds as discussed. Further disposition pending results of labs. Discussed med's effects and SE's.   Over 30 minutes of face to face exam, counseling, chart review, and critical decision making was performed.   Future Appointments  Date Time Provider Department Center  04/30/2023  9:00 AM Raynelle Dick, NP [facility] None     ----------------------------------------------------------------------------------------------------------------------  HPI 67 y.o. female  presents for 3 month follow up on HTN, HLD, Afib, hx of abnormal glucose, weight and vitamin D deficiency.   BMI is Body mass index is 26.15 kg/m., she has been back to working on diet and exercise  She is following through [facility] weight loss. She eats 2 meals a day with protein vegetable and fruits and 2 protein snacks. 80 ounces of water a day.  Walks 30 minutes a day every day Wt Readings from Last 3 Encounters:  10/25/22 162 lb (73.5 kg)  04/26/22 201 lb 3.2 oz (91.3 kg)  11/14/21 195 lb (88.5 kg)   She has history of Afib with RVR, on xarelto, had cardioversion in Jan 2018, now treated by lopressor 50 mg BID and diltiazem 90 mg BID. EF was 40% via cath 03/2016. She follows with cardiology. No concerns with excess bleeding. Follows with Dr. Algie Coffer. Last echo 05/03/22   Her blood pressure has been controlled at home, today their BP is BP: 118/72 . She is on Lisinopril 5 mg qd and may be d/c at next visit with cardiology BP Readings from Last 3 Encounters:  10/25/22 118/72  04/26/22 112/80  11/14/21 (!) 150/113  She does workout. She denies chest pain, shortness of breath, dizziness.    She is on cholesterol medication (rosuvastatin 40 mg daily) and denies myalgias. Her cholesterol is at goal. The cholesterol last visit was:   Lab Results  Component Value Date   CHOL 162 04/26/2022   HDL 56 04/26/2022   LDLCALC 81 04/26/2022   TRIG 157 (H) 04/26/2022   CHOLHDL 2.9 04/26/2022    She has  been working on diet and exercise for glucose management, and denies increased appetite, nausea, paresthesia of the feet, polydipsia, polyuria, visual disturbances and vomiting. Last A1C in the office was:  Lab Results  Component Value Date   HGBA1C 6.4 (H) 04/26/2022   She has increased her water 80 ounces of water.  She has CKD III Lab Results   Component Value Date   EGFR 56 (L) 04/26/2022    Patient is on Vitamin D supplement and near goal at recent check:   Lab Results  Component Value Date   VD25OH 53 04/26/2022        Current Medications:  Current Outpatient Medications on File Prior to Visit  Medication Sig   Cholecalciferol (VITAMIN D PO) Take 5,000 Units by mouth daily.    clobetasol ointment (TEMOVATE) 0.05 % APPLY A THIN LAYER TO TO THE AFFECTED AREA ON THE SKIN TWICE DAILY   diltiazem (CARDIZEM) 90 MG tablet TAKE 1 TABLET BY MOUTH TWICE DAILY   furosemide (LASIX) 40 MG tablet TAKE 1 TABLET BY MOUTH DAILY FOR BLOOD PRESSURE AND FLUID RETENTION OR ANKLE SWELLING   lisinopril (ZESTRIL) 10 MG tablet 10 mg daily.    metoprolol (LOPRESSOR) 50 MG tablet Take 50 mg by mouth 2 (two) times daily.   rivaroxaban (XARELTO) 20 MG TABS tablet Take 20 mg by mouth daily with supper.   rosuvastatin (CRESTOR) 40 MG tablet TAKE 1 TABLET BY MOUTH DAILY FOR CHOLESTEROL   zinc gluconate 50 MG tablet Take 50 mg by mouth daily.   diltiazem (CARDIZEM) 60 MG tablet Take 60 mg by mouth 2 (two) times daily. (Patient not taking: Reported on 10/25/2022)   Multiple Vitamins-Minerals (PRESERVISION AREDS PO) Take by mouth daily. (Patient not taking: Reported on 10/25/2022)   nystatin-triamcinolone (MYCOLOG II) cream APPLY TOPICALLY TO THE AFFECTED AREA EVERY MORNING AND EVERY EVENING (Patient not taking: Reported on 10/25/2022)   No current facility-administered medications on file prior to visit.     Allergies:  Allergies  Allergen Reactions   Codeine     REACTION: TREMORS/NERVOUS   Penicillins     REACTION: RASH/SWELLING     Medical History:  Past Medical History:  Diagnosis Date   Atrial fibrillation (HCC) 03/22/2016   NEW ONSET    Hyperlipidemia    Hypertension    Pre-diabetes    Vitamin D deficiency    Family history- Reviewed and unchanged Social history- Reviewed and unchanged  [facility] Dr Henderson Cloud Mammogram in office  scheudle for 05/2021  Review of Systems:  Review of Systems  Constitutional:  Negative for malaise/fatigue and weight loss.  HENT:  Negative for hearing loss and tinnitus.   Eyes:  Negative for blurred vision and double vision.  Respiratory:  Negative for cough, shortness of breath and wheezing.   Cardiovascular:  Negative for chest pain, palpitations, orthopnea, claudication and leg swelling.  Gastrointestinal:  Negative for abdominal pain, blood in stool, constipation, diarrhea, heartburn, melena, nausea and vomiting.  Genitourinary: Negative.   Musculoskeletal:  Negative for joint pain and myalgias.  Skin:  Negative for rash.  Neurological:  Negative for dizziness, tingling, sensory change, weakness and headaches.  Endo/Heme/Allergies:  Negative for polydipsia.  Psychiatric/Behavioral: Negative.    All other systems reviewed and are negative.   Physical Exam: BP 118/72   Pulse 76   Temp 97.7 F (36.5 C)   Ht 5\' 6"  (1.676 m)   Wt 162 lb (73.5 kg)   SpO2 96%   BMI 26.15 kg/m  Wt  Readings from Last 3 Encounters:  10/25/22 162 lb (73.5 kg)  04/26/22 201 lb 3.2 oz (91.3 kg)  11/14/21 195 lb (88.5 kg)   General Appearance: Well nourished, in no apparent distress. Eyes: PERRLA, EOMs, conjunctiva no swelling or erythema Sinuses: No Frontal/maxillary tenderness ENT/Mouth: Ext aud canals clear, TMs without erythema, bulging. No erythema, swelling, or exudate on post pharynx.  Tonsils not swollen or erythematous. Hearing normal.  Neck: Supple, thyroid normal.  Respiratory: Respiratory effort normal, BS equal bilaterally without rales, rhonchi, wheezing or stridor.  Cardio: Heart sounds irregularly irregular without audible murmur. Brisk peripheral pulses without edema.  Abdomen: Soft, + BS.  Non tender, no guarding, rebound, hernias, masses. Lymphatics: Non tender without lymphadenopathy.  Musculoskeletal: Full ROM, 5/5 strength, Normal gait Skin: Warm, dry without rashes,  lesions, ecchymosis.  Neuro: Cranial nerves intact. No cerebellar symptoms.  Psych: Awake and oriented X 3, normal affect, Insight and Judgment appropriate.       Raynelle Dick, NP 9:50 AM [facility]

## 2022-10-25 ENCOUNTER — Ambulatory Visit: Payer: BC Managed Care – PPO | Admitting: Nurse Practitioner

## 2022-10-25 ENCOUNTER — Encounter: Payer: Self-pay | Admitting: Nurse Practitioner

## 2022-10-25 VITALS — BP 118/72 | HR 76 | Temp 97.7°F | Ht 66.0 in | Wt 162.0 lb

## 2022-10-25 DIAGNOSIS — Z79899 Other long term (current) drug therapy: Secondary | ICD-10-CM

## 2022-10-25 DIAGNOSIS — I480 Paroxysmal atrial fibrillation: Secondary | ICD-10-CM | POA: Diagnosis not present

## 2022-10-25 DIAGNOSIS — E782 Mixed hyperlipidemia: Secondary | ICD-10-CM | POA: Diagnosis not present

## 2022-10-25 DIAGNOSIS — E559 Vitamin D deficiency, unspecified: Secondary | ICD-10-CM

## 2022-10-25 DIAGNOSIS — N183 Chronic kidney disease, stage 3 unspecified: Secondary | ICD-10-CM

## 2022-10-25 DIAGNOSIS — I10 Essential (primary) hypertension: Secondary | ICD-10-CM | POA: Diagnosis not present

## 2022-10-25 DIAGNOSIS — D6859 Other primary thrombophilia: Secondary | ICD-10-CM

## 2022-10-25 DIAGNOSIS — R7309 Other abnormal glucose: Secondary | ICD-10-CM

## 2022-10-25 DIAGNOSIS — E663 Overweight: Secondary | ICD-10-CM

## 2022-10-25 NOTE — Patient Instructions (Signed)

## 2022-10-30 LAB — HEMOGLOBIN A1C
Hgb A1c MFr Bld: 5.7 % of total Hgb — ABNORMAL HIGH (ref <5.7)
Mean Plasma Glucose: 117 mg/dL
eAG (mmol/L): 6.5 mmol/L

## 2022-10-30 LAB — CBC WITH DIFFERENTIAL/PLATELET
Absolute Monocytes: 773 cells/uL (ref 200–950)
Basophils Absolute: 38 cells/uL (ref 0–200)
Basophils Relative: 0.5 %
Eosinophils Absolute: 98 cells/uL (ref 15–500)
Eosinophils Relative: 1.3 %
HCT: 43.8 % (ref 35.0–45.0)
Hemoglobin: 14.3 g/dL (ref 11.7–15.5)
Lymphs Abs: 2085 cells/uL (ref 850–3900)
MCH: 32.4 pg (ref 27.0–33.0)
MCHC: 32.6 g/dL (ref 32.0–36.0)
MCV: 99.3 fL (ref 80.0–100.0)
MPV: 11.9 fL (ref 7.5–12.5)
Monocytes Relative: 10.3 %
Neutro Abs: 4508 cells/uL (ref 1500–7800)
Neutrophils Relative %: 60.1 %
Platelets: 199 10*3/uL (ref 140–400)
RBC: 4.41 10*6/uL (ref 3.80–5.10)
RDW: 11.9 % (ref 11.0–15.0)
Total Lymphocyte: 27.8 %
WBC: 7.5 10*3/uL (ref 3.8–10.8)

## 2022-10-30 LAB — COMPLETE METABOLIC PANEL WITH GFR
AG Ratio: 1.8 (calc) (ref 1.0–2.5)
ALT: 21 U/L (ref 6–29)
AST: 25 U/L (ref 10–35)
Albumin: 4.7 g/dL (ref 3.6–5.1)
Alkaline phosphatase (APISO): 103 U/L (ref 37–153)
BUN/Creatinine Ratio: 19 (calc) (ref 6–22)
BUN: 24 mg/dL (ref 7–25)
CO2: 29 mmol/L (ref 20–32)
Calcium: 10.5 mg/dL — ABNORMAL HIGH (ref 8.6–10.4)
Chloride: 103 mmol/L (ref 98–110)
Creat: 1.25 mg/dL — ABNORMAL HIGH (ref 0.50–1.05)
Globulin: 2.6 g/dL (calc) (ref 1.9–3.7)
Glucose, Bld: 84 mg/dL (ref 65–99)
Potassium: 4.3 mmol/L (ref 3.5–5.3)
Sodium: 142 mmol/L (ref 135–146)
Total Bilirubin: 0.5 mg/dL (ref 0.2–1.2)
Total Protein: 7.3 g/dL (ref 6.1–8.1)
eGFR: 47 mL/min/{1.73_m2} — ABNORMAL LOW (ref >=60)

## 2022-10-30 LAB — LIPID PANEL
Cholesterol: 164 mg/dL (ref <200)
HDL: 52 mg/dL (ref >=50)
LDL Cholesterol (Calc): 92 mg/dL (calc)
Non-HDL Cholesterol (Calc): 112 mg/dL (calc) (ref <130)
Total CHOL/HDL Ratio: 3.2 (calc) (ref <5.0)
Triglycerides: 103 mg/dL (ref <150)

## 2022-10-30 LAB — TSH: TSH: 2.23 mIU/L (ref 0.40–4.50)

## 2022-11-02 DIAGNOSIS — H2513 Age-related nuclear cataract, bilateral: Secondary | ICD-10-CM | POA: Diagnosis not present

## 2022-11-02 DIAGNOSIS — H5213 Myopia, bilateral: Secondary | ICD-10-CM | POA: Diagnosis not present

## 2022-11-02 DIAGNOSIS — H35353 Cystoid macular degeneration, bilateral: Secondary | ICD-10-CM | POA: Diagnosis not present

## 2022-11-04 ENCOUNTER — Other Ambulatory Visit: Payer: Self-pay | Admitting: Nurse Practitioner

## 2022-11-07 DIAGNOSIS — H35443 Age-related reticular degeneration of retina, bilateral: Secondary | ICD-10-CM | POA: Diagnosis not present

## 2022-11-07 DIAGNOSIS — H43823 Vitreomacular adhesion, bilateral: Secondary | ICD-10-CM | POA: Diagnosis not present

## 2022-11-07 DIAGNOSIS — H35073 Retinal telangiectasis, bilateral: Secondary | ICD-10-CM | POA: Diagnosis not present

## 2022-11-26 ENCOUNTER — Ambulatory Visit (INDEPENDENT_AMBULATORY_CARE_PROVIDER_SITE_OTHER): Payer: BC Managed Care – PPO

## 2022-11-26 VITALS — BP 118/70 | HR 61 | Temp 97.9°F | Resp 16 | Ht 66.0 in | Wt 161.0 lb

## 2022-11-26 DIAGNOSIS — N183 Chronic kidney disease, stage 3 unspecified: Secondary | ICD-10-CM

## 2022-11-26 NOTE — Progress Notes (Signed)
The patient came in for repeat blood work today. She reports no new issues at this time.

## 2022-11-27 ENCOUNTER — Other Ambulatory Visit: Payer: Self-pay | Admitting: Nurse Practitioner

## 2022-11-27 ENCOUNTER — Ambulatory Visit
Admission: RE | Admit: 2022-11-27 | Discharge: 2022-11-27 | Disposition: A | Discharge status: Home / Self Care | Payer: BC Managed Care – PPO | Source: Ambulatory Visit | Attending: Nurse Practitioner | Admitting: Nurse Practitioner

## 2022-11-27 DIAGNOSIS — N289 Disorder of kidney and ureter, unspecified: Secondary | ICD-10-CM

## 2022-11-27 DIAGNOSIS — N183 Chronic kidney disease, stage 3 unspecified: Secondary | ICD-10-CM

## 2022-11-27 DIAGNOSIS — N1832 Chronic kidney disease, stage 3b: Secondary | ICD-10-CM | POA: Diagnosis not present

## 2022-11-27 LAB — BASIC METABOLIC PANEL WITH GFR
BUN/Creatinine Ratio: 17 (calc) (ref 6–22)
BUN: 24 mg/dL (ref 7–25)
CO2: 29 mmol/L (ref 20–32)
Calcium: 10.1 mg/dL (ref 8.6–10.4)
Chloride: 102 mmol/L (ref 98–110)
Creat: 1.43 mg/dL — ABNORMAL HIGH (ref 0.50–1.05)
Glucose, Bld: 105 mg/dL — ABNORMAL HIGH (ref 65–99)
Potassium: 4.5 mmol/L (ref 3.5–5.3)
Sodium: 139 mmol/L (ref 135–146)
eGFR: 40 mL/min/{1.73_m2} — ABNORMAL LOW (ref >=60)

## 2022-12-07 ENCOUNTER — Other Ambulatory Visit: Payer: Self-pay | Admitting: Nurse Practitioner

## 2023-01-02 DIAGNOSIS — I5022 Chronic systolic (congestive) heart failure: Secondary | ICD-10-CM | POA: Diagnosis not present

## 2023-01-02 DIAGNOSIS — I342 Nonrheumatic mitral (valve) stenosis: Secondary | ICD-10-CM | POA: Diagnosis not present

## 2023-01-02 DIAGNOSIS — I10 Essential (primary) hypertension: Secondary | ICD-10-CM | POA: Diagnosis not present

## 2023-01-02 DIAGNOSIS — I4821 Permanent atrial fibrillation: Secondary | ICD-10-CM | POA: Diagnosis not present

## 2023-01-28 DIAGNOSIS — I342 Nonrheumatic mitral (valve) stenosis: Secondary | ICD-10-CM | POA: Diagnosis not present

## 2023-01-28 DIAGNOSIS — I10 Essential (primary) hypertension: Secondary | ICD-10-CM | POA: Diagnosis not present

## 2023-01-28 DIAGNOSIS — I4821 Permanent atrial fibrillation: Secondary | ICD-10-CM | POA: Diagnosis not present

## 2023-01-28 DIAGNOSIS — I5022 Chronic systolic (congestive) heart failure: Secondary | ICD-10-CM | POA: Diagnosis not present

## 2023-04-29 NOTE — Progress Notes (Unsigned)
COMPLETE PHYSICAL EXAM  Assessment and Plan:   Encounter for general adult medical examination with abnormal findings Due Yearly Mammogram and Pap scheduled for 06/2023 with Dr. Henderson Cloud DEXA 2021 was normal with T score of 1.1 Colonoscopy overdue since 2021- will call Dr. Leone Payor to schedule  Hypertension Well controlled with diltiazem 90 mg bid , lisinopril 10 mg 1/2 tab every day and metoprolol 50 mg bid Will decrease Furosemide 40 mg to PRN for swelling Monitor blood pressure at home; patient to call if consistently greater than 130/80 Continue DASH diet.   Reminder to go to the ER if any CP, SOB, nausea, dizziness, severe HA, changes vision/speech, left arm numbness and tingling and jaw pain. CBC  Cholesterol Currently at goal; continue with rosuvastatin 40 mg every day  Continue low cholesterol diet and exercise.   Defer lipid today, was checked last month CMP TSH  Abnormal glucose Continue diet and exercise.  Perform daily foot/skin check, notify office of any concerning changes.  Check A1C q61m  Overweight Long discussion about weight loss, diet, and exercise Recommended diet heavy in fruits and veggies and low in animal meats, cheeses, and dairy products, appropriate calorie intake Patient will work on -sticking to new low carb diet, decrease saturated fats.  Increase exercise, finding alternatives to walking she can do while weather is cold; video exercise programs suggested Will follow up in 3 months - TSH  Vitamin D deficiency Continue Vit D supplementationto maintain a level between 60-100 VitD  Hair Loss Start multivitamin Will check CBC, CMP, TSH  Paroxysmal a. Fib (HCC) Rate not controlled at this time, has echo scheduled for December No concerns with excessive bleeding, no falls Continue medication: xarelto 20 mg QD  and lopressor 50 BID, cardizem 90 mg BID       Continue follow up with cardiology, echo scheduled for next week  Thrombophilia  (HCC) R/t a. Fib; continue xarelto unless bleeding contraindications  Stage 3 CKD(HCC) Increase fluids, avoid NSAIDS, monitor sugars, will monitor Decrease furosemide 40 mg to PRN with edema   Screening for hematuria and proteinuria Routine urinalysis with reflex microscopic Microalbumin/creatinine urine ratio  Screening for ischemic heart disease EKG  Screening for AAA - U/S ABD Retroperitoneal LTD  Screening for thyroid disorder - TSH  Medication management -     CBC with Differential/Platelet -     COMPLETE METABOLIC PANEL WITH GFR -     Magnesium -     Lipid panel -     TSH -     Hemoglobin A1C w/out eAG -     VITAMIN D 25 Hydroxy (Vit-D Deficiency, Fractures) -     EKG 12-Lead -     [place], RETROPERITNL ABD,  LTD -     Urinalysis, Routine w reflex microscopic -     Microalbumin / creatinine urine ratio      Continue diet and meds as discussed. Further disposition pending results of labs. Discussed med's effects and SE's.   Over 30 minutes of exam, counseling, chart review, and critical decision making was performed.   Future Appointments  Date Time Provider Department Center  04/29/2024  9:00 AM Raynelle Dick, NP [facility] None    ----------------------------------------------------------------------------------------------------------------------  HPI 67 y.o. female  presents for CPE and 3 month follow up on hypertension, cholesterol, hx of abnormal glucose, weight and vitamin D deficiency.   BMI is Body mass index is 26.34 kg/m., she has been working on diet and exercise - following a low carb  diet. She is trying to get up hourly and drink water. She walks 30 minutes a day.  She just got back from vacation Wt Readings from Last 3 Encounters:  04/30/23 163 lb 3.2 oz (74 kg)  11/26/22 161 lb (73 kg)  10/25/22 162 lb (73.5 kg)   She has history of Afib with RVR, on xarelto, had cardioversion in Jan 2018, now treated by lopressor, diltiazem and Xarelto. EF  was 40% via cath 03/2016. She follows with cardiology. No concerns with excess bleeding. Follows with Dr. Algie Coffer. Echo is scheduled for December Her blood pressure has been controlled at home with furosemide 40 mg every day, diltiazem 90 mg bid , lisinopril 10 mg 1/2 tab every day and metoprolol 50 mg bid , today their BP is BP: 112/80 BP Readings from Last 3 Encounters:  04/30/23 112/80  11/26/22 118/70  10/25/22 118/72   She does workout. She denies chest pain, shortness of breath, dizziness.   She is on cholesterol medication (rosuvastatin 40 mg daily) and denies myalgias. Her cholesterol is at goal.  Discussed reducing red meat, dairy, eggs and fried foods.  The cholesterol last visit was:   Lab Results  Component Value Date   CHOL 164 10/25/2022   HDL 52 10/25/2022   LDLCALC 92 10/25/2022   TRIG 103 10/25/2022   CHOLHDL 3.2 10/25/2022    She has been working on diet and exercise for glucose management, and denies increased appetite, nausea, paresthesia of the feet, polydipsia, polyuria, visual disturbances and vomiting. She limits simple carbs. She is in stage 3 CKD. Last A1C in the office was:  Lab Results  Component Value Date   HGBA1C 5.7 (H) 10/25/2022   She drinks 80 ounces of water a day Lab Results  Component Value Date   EGFR 40 (L) 11/26/2022    Patient is on Vitamin D supplement and near goal at recent check:   Lab Results  Component Value Date   VD25OH 53 04/26/2022      Thyroid in normal range without medication Lab Results  Component Value Date   TSH 2.23 10/25/2022   Hair has been falling out much worse than previously, she will get handfuls of hair when washing and brushing hair. She does not take a multivitamin.  She does have a diet high in protein and vegetables.    Current Medications:  Current Outpatient Medications on File Prior to Visit  Medication Sig   Cholecalciferol (VITAMIN D PO) Take 5,000 Units by mouth daily.    clobetasol ointment  (TEMOVATE) 0.05 % APPLY A THIN LAYER TO TO THE AFFECTED AREA ON THE SKIN TWICE DAILY   diltiazem (CARDIZEM) 90 MG tablet TAKE 1 TABLET BY MOUTH TWICE DAILY   furosemide (LASIX) 40 MG tablet TAKE 1 TABLET BY MOUTH DAILY FOR BLOOD PRESSURE AND FLUID RETENTION OR ANKLE SWELLING   lisinopril (ZESTRIL) 10 MG tablet 10 mg daily.    metoprolol (LOPRESSOR) 50 MG tablet Take 50 mg by mouth 2 (two) times daily.   rivaroxaban (XARELTO) 20 MG TABS tablet Take 20 mg by mouth daily with supper.   rosuvastatin (CRESTOR) 40 MG tablet TAKE 1 TABLET BY MOUTH DAILY FOR CHOLESTEROL   Multiple Vitamins-Minerals (PRESERVISION AREDS PO) Take by mouth daily. (Patient not taking: Reported on 10/25/2022)   zinc gluconate 50 MG tablet Take 50 mg by mouth daily. (Patient not taking: Reported on 04/30/2023)   No current facility-administered medications on file prior to visit.     Allergies:  Allergies  Allergen Reactions   Codeine     REACTION: TREMORS/NERVOUS   Penicillins     REACTION: RASH/SWELLING     Medical History:  Past Medical History:  Diagnosis Date   Atrial fibrillation (HCC) 03/22/2016   NEW ONSET    Hyperlipidemia    Hypertension    Pre-diabetes    Vitamin D deficiency    Family history- Reviewed and unchanged Social history- Reviewed and unchanged   Review of Systems:  Review of Systems  Constitutional:  Negative for chills, fever, malaise/fatigue and weight loss.  HENT:  Negative for congestion, hearing loss, sinus pain, sore throat and tinnitus.   Eyes:  Negative for blurred vision and double vision.  Respiratory:  Negative for cough, hemoptysis, sputum production, shortness of breath and wheezing.   Cardiovascular:  Negative for chest pain, palpitations, orthopnea, claudication and leg swelling.       Ongoing atrial fibrillation  Gastrointestinal:  Negative for abdominal pain, blood in stool, constipation, diarrhea, heartburn, melena, nausea and vomiting.  Genitourinary: Negative.   Negative for dysuria and urgency.  Musculoskeletal:  Negative for back pain, falls, joint pain, myalgias and neck pain.  Skin:  Negative for rash.  Neurological:  Negative for dizziness, tingling, tremors, sensory change, weakness and headaches.  Endo/Heme/Allergies:  Negative for polydipsia. Does not bruise/bleed easily.  Psychiatric/Behavioral: Negative.  Negative for depression and suicidal ideas. The patient is not nervous/anxious and does not have insomnia.   All other systems reviewed and are negative.   Physical Exam: BP 112/80   Pulse 64   Temp 97.7 F (36.5 C)   Ht 5\' 6"  (1.676 m)   Wt 163 lb 3.2 oz (74 kg)   SpO2 96%   BMI 26.34 kg/m  Wt Readings from Last 3 Encounters:  04/30/23 163 lb 3.2 oz (74 kg)  11/26/22 161 lb (73 kg)  10/25/22 162 lb (73.5 kg)   General Appearance: Well nourished, in no apparent distress. Eyes: PERRLA, EOMs, conjunctiva no swelling or erythema Sinuses: No Frontal/maxillary tenderness ENT/Mouth: Ext aud canals clear, TMs without erythema, bulging. No erythema, swelling, or exudate on post pharynx.   Hearing normal.  Neck: Supple, thyroid normal. No bruits Respiratory: Respiratory effort normal, BS equal bilaterally without rales, rhonchi, wheezing or stridor.  Cardio: Heart sounds irregularly irregular without audible murmur. Brisk peripheral pulses without edema.  Breast: defer to GYN Pelvic: defer to GYN Abdomen: Soft, + BS.  Non tender, no guarding, rebound, hernias, masses. Lymphatics: Non tender without lymphadenopathy.  Musculoskeletal: Full ROM, 5/5 strength, Normal gait.  Skin: Warm, dry without rashes, lesions, ecchymosis.  Neuro: Cranial nerves intact. No cerebellar symptoms.  Psych: Awake and oriented X 3, normal affect, Insight and Judgment appropriate.   EKG: Atrial fibrillation, no change AAA: < 3 cm   Suresh Audi Hollie Salk, NP 9:09 AM [facility]

## 2023-04-30 ENCOUNTER — Ambulatory Visit (INDEPENDENT_AMBULATORY_CARE_PROVIDER_SITE_OTHER): Payer: BC Managed Care – PPO | Admitting: Nurse Practitioner

## 2023-04-30 ENCOUNTER — Encounter: Payer: Self-pay | Admitting: Nurse Practitioner

## 2023-04-30 VITALS — BP 112/80 | HR 64 | Temp 97.7°F | Ht 66.0 in | Wt 163.2 lb

## 2023-04-30 DIAGNOSIS — Z131 Encounter for screening for diabetes mellitus: Secondary | ICD-10-CM

## 2023-04-30 DIAGNOSIS — E66811 Obesity, class 1: Secondary | ICD-10-CM

## 2023-04-30 DIAGNOSIS — I700 Atherosclerosis of aorta: Secondary | ICD-10-CM | POA: Diagnosis not present

## 2023-04-30 DIAGNOSIS — Z0000 Encounter for general adult medical examination without abnormal findings: Secondary | ICD-10-CM

## 2023-04-30 DIAGNOSIS — N183 Chronic kidney disease, stage 3 unspecified: Secondary | ICD-10-CM

## 2023-04-30 DIAGNOSIS — Z136 Encounter for screening for cardiovascular disorders: Secondary | ICD-10-CM | POA: Diagnosis not present

## 2023-04-30 DIAGNOSIS — Z0001 Encounter for general adult medical examination with abnormal findings: Secondary | ICD-10-CM

## 2023-04-30 DIAGNOSIS — E782 Mixed hyperlipidemia: Secondary | ICD-10-CM

## 2023-04-30 DIAGNOSIS — Z1389 Encounter for screening for other disorder: Secondary | ICD-10-CM

## 2023-04-30 DIAGNOSIS — D6859 Other primary thrombophilia: Secondary | ICD-10-CM

## 2023-04-30 DIAGNOSIS — L659 Nonscarring hair loss, unspecified: Secondary | ICD-10-CM

## 2023-04-30 DIAGNOSIS — Z13220 Encounter for screening for lipoid disorders: Secondary | ICD-10-CM | POA: Diagnosis not present

## 2023-04-30 DIAGNOSIS — E663 Overweight: Secondary | ICD-10-CM

## 2023-04-30 DIAGNOSIS — Z79899 Other long term (current) drug therapy: Secondary | ICD-10-CM

## 2023-04-30 DIAGNOSIS — Z1329 Encounter for screening for other suspected endocrine disorder: Secondary | ICD-10-CM

## 2023-04-30 DIAGNOSIS — E559 Vitamin D deficiency, unspecified: Secondary | ICD-10-CM

## 2023-04-30 DIAGNOSIS — I10 Essential (primary) hypertension: Secondary | ICD-10-CM

## 2023-04-30 DIAGNOSIS — I480 Paroxysmal atrial fibrillation: Secondary | ICD-10-CM

## 2023-04-30 DIAGNOSIS — R7309 Other abnormal glucose: Secondary | ICD-10-CM

## 2023-04-30 NOTE — Patient Instructions (Signed)

## 2023-05-01 LAB — LIPID PANEL
Cholesterol: 208 mg/dL — ABNORMAL HIGH (ref <200)
HDL: 61 mg/dL (ref >=50)
LDL Cholesterol (Calc): 119 mg/dL — ABNORMAL HIGH
Non-HDL Cholesterol (Calc): 147 mg/dL — ABNORMAL HIGH (ref <130)
Total CHOL/HDL Ratio: 3.4 (calc) (ref <5.0)
Triglycerides: 165 mg/dL — ABNORMAL HIGH (ref <150)

## 2023-05-01 LAB — COMPLETE METABOLIC PANEL WITH GFR
AG Ratio: 1.7 (calc) (ref 1.0–2.5)
ALT: 15 U/L (ref 6–29)
AST: 23 U/L (ref 10–35)
Albumin: 4.8 g/dL (ref 3.6–5.1)
Alkaline phosphatase (APISO): 120 U/L (ref 37–153)
BUN/Creatinine Ratio: 20 (calc) (ref 6–22)
BUN: 24 mg/dL (ref 7–25)
CO2: 30 mmol/L (ref 20–32)
Calcium: 10.7 mg/dL — ABNORMAL HIGH (ref 8.6–10.4)
Chloride: 101 mmol/L (ref 98–110)
Creat: 1.2 mg/dL — ABNORMAL HIGH (ref 0.50–1.05)
Globulin: 2.9 g/dL (ref 1.9–3.7)
Glucose, Bld: 102 mg/dL — ABNORMAL HIGH (ref 65–99)
Potassium: 4.3 mmol/L (ref 3.5–5.3)
Sodium: 141 mmol/L (ref 135–146)
Total Bilirubin: 0.6 mg/dL (ref 0.2–1.2)
Total Protein: 7.7 g/dL (ref 6.1–8.1)
eGFR: 49 mL/min/{1.73_m2} — ABNORMAL LOW (ref >=60)

## 2023-05-01 LAB — CBC WITH DIFFERENTIAL/PLATELET
Absolute Lymphocytes: 1865 {cells}/uL (ref 850–3900)
Absolute Monocytes: 627 {cells}/uL (ref 200–950)
Basophils Absolute: 39 {cells}/uL (ref 0–200)
Basophils Relative: 0.7 %
Eosinophils Absolute: 99 {cells}/uL (ref 15–500)
Eosinophils Relative: 1.8 %
HCT: 43.3 % (ref 35.0–45.0)
Hemoglobin: 14.5 g/dL (ref 11.7–15.5)
MCH: 32.6 pg (ref 27.0–33.0)
MCHC: 33.5 g/dL (ref 32.0–36.0)
MCV: 97.3 fL (ref 80.0–100.0)
MPV: 11.2 fL (ref 7.5–12.5)
Monocytes Relative: 11.4 %
Neutro Abs: 2871 {cells}/uL (ref 1500–7800)
Neutrophils Relative %: 52.2 %
Platelets: 183 10*3/uL (ref 140–400)
RBC: 4.45 10*6/uL (ref 3.80–5.10)
RDW: 12.4 % (ref 11.0–15.0)
Total Lymphocyte: 33.9 %
WBC: 5.5 10*3/uL (ref 3.8–10.8)

## 2023-05-01 LAB — URINALYSIS, ROUTINE W REFLEX MICROSCOPIC
Bilirubin Urine: NEGATIVE
Glucose, UA: NEGATIVE
Hgb urine dipstick: NEGATIVE
Ketones, ur: NEGATIVE
Leukocytes,Ua: NEGATIVE
Nitrite: NEGATIVE
Protein, ur: NEGATIVE
Specific Gravity, Urine: 1.015 (ref 1.001–1.035)
pH: 5.5 (ref 5.0–8.0)

## 2023-05-01 LAB — HEMOGLOBIN A1C W/OUT EAG: Hgb A1c MFr Bld: 6 %{Hb} — ABNORMAL HIGH (ref <5.7)

## 2023-05-01 LAB — MICROALBUMIN / CREATININE URINE RATIO
Creatinine, Urine: 111 mg/dL (ref 20–275)
Microalb Creat Ratio: 4 mg/g{creat} (ref <30)
Microalb, Ur: 0.4 mg/dL

## 2023-05-01 LAB — TSH: TSH: 2.79 m[IU]/L (ref 0.40–4.50)

## 2023-05-01 LAB — MAGNESIUM: Magnesium: 2.2 mg/dL (ref 1.5–2.5)

## 2023-05-01 LAB — VITAMIN D 25 HYDROXY (VIT D DEFICIENCY, FRACTURES): Vit D, 25-Hydroxy: 69 ng/mL (ref 30–100)

## 2023-05-06 DIAGNOSIS — I4821 Permanent atrial fibrillation: Secondary | ICD-10-CM | POA: Diagnosis not present

## 2023-05-06 DIAGNOSIS — I5022 Chronic systolic (congestive) heart failure: Secondary | ICD-10-CM | POA: Diagnosis not present

## 2023-05-06 DIAGNOSIS — I342 Nonrheumatic mitral (valve) stenosis: Secondary | ICD-10-CM | POA: Diagnosis not present

## 2023-05-06 DIAGNOSIS — I10 Essential (primary) hypertension: Secondary | ICD-10-CM | POA: Diagnosis not present

## 2023-06-27 DIAGNOSIS — Z01419 Encounter for gynecological examination (general) (routine) without abnormal findings: Secondary | ICD-10-CM | POA: Diagnosis not present

## 2023-06-27 DIAGNOSIS — Z124 Encounter for screening for malignant neoplasm of cervix: Secondary | ICD-10-CM | POA: Diagnosis not present

## 2023-06-27 DIAGNOSIS — Z1231 Encounter for screening mammogram for malignant neoplasm of breast: Secondary | ICD-10-CM | POA: Diagnosis not present

## 2023-08-06 DIAGNOSIS — I4821 Permanent atrial fibrillation: Secondary | ICD-10-CM | POA: Diagnosis not present

## 2023-08-06 DIAGNOSIS — I5022 Chronic systolic (congestive) heart failure: Secondary | ICD-10-CM | POA: Diagnosis not present

## 2023-08-06 DIAGNOSIS — I10 Essential (primary) hypertension: Secondary | ICD-10-CM | POA: Diagnosis not present

## 2023-08-06 DIAGNOSIS — I342 Nonrheumatic mitral (valve) stenosis: Secondary | ICD-10-CM | POA: Diagnosis not present

## 2023-09-18 ENCOUNTER — Ambulatory Visit: Payer: BC Managed Care – PPO | Admitting: Family Medicine

## 2023-09-18 ENCOUNTER — Encounter: Payer: Self-pay | Admitting: Family Medicine

## 2023-09-18 VITALS — BP 136/80 | HR 64 | Temp 97.7°F | Ht 66.0 in | Wt 166.8 lb

## 2023-09-18 DIAGNOSIS — I10 Essential (primary) hypertension: Secondary | ICD-10-CM

## 2023-09-18 DIAGNOSIS — I480 Paroxysmal atrial fibrillation: Secondary | ICD-10-CM

## 2023-09-18 DIAGNOSIS — E663 Overweight: Secondary | ICD-10-CM

## 2023-09-18 DIAGNOSIS — E782 Mixed hyperlipidemia: Secondary | ICD-10-CM

## 2023-09-18 DIAGNOSIS — Z6826 Body mass index (BMI) 26.0-26.9, adult: Secondary | ICD-10-CM

## 2023-09-18 DIAGNOSIS — Z1211 Encounter for screening for malignant neoplasm of colon: Secondary | ICD-10-CM

## 2023-09-18 DIAGNOSIS — R7303 Prediabetes: Secondary | ICD-10-CM | POA: Diagnosis not present

## 2023-09-18 DIAGNOSIS — N1831 Chronic kidney disease, stage 3a: Secondary | ICD-10-CM

## 2023-09-18 DIAGNOSIS — Z7689 Persons encountering health services in other specified circumstances: Secondary | ICD-10-CM | POA: Insufficient documentation

## 2023-09-18 NOTE — Assessment & Plan Note (Signed)
 Chronic well controlled on Diltiazem 90mg  BID, Lisinopril 5mg  daily, Metoprolol 75mg  BID, Lasix PRN. Recommend heart healthy diet such as Mediterranean diet with whole grains, fruits, vegetable, fish, lean meats, nuts, and olive oil. Limit salt. Encouraged moderate walking, 3-5 times/week for 30-50 minutes each session. Aim for at least 150 minutes.week. Goal should be pace of 3 miles/hours, or walking 1.5 miles in 30 minutes. Avoid tobacco products. Avoid excess alcohol. Take medications as prescribed and bring medications and blood pressure log with cuff to each office visit. Seek medical care for chest pain, palpitations, shortness of breath with exertion, dizziness/lightheadedness, vision changes, recurrent headaches, or swelling of extremities. Follow up in 6 months

## 2023-09-18 NOTE — Progress Notes (Signed)
 New Patient Office Visit  Subjective    Patient ID: Madison Whitehead, female    DOB: [birth year]  Age: 68 y.o. MRN: [id number]  CC:  Chief Complaint  Patient presents with   Establish Care    HPI Madison Whitehead presents to establish care. Oriented to practice routines and expectations. Has been seeing PCP regularly as well as cardiology and OBGYN. Chronic conditions are well controlled.    Breast CA screening: Mammogram status: Completed 06/2023. Repeat every year Cervical CA screening: approximate date 06/2023 and was normal Colon CA screening: colonoscopy  14  years ago without abnormalities. DEXA: DEXA scan, previous DEXA scan T-score was minus 1.1. Tobacco: non-smoker Vaccines:  declines  HTN: well controlled on Diltiazem 90mg  BID, Lisinopril 5mg  daily, Metoprolol 75mg  BID, Lasix PRN  HLD: Crestor 40mg  daily  Afib:  Xarelto 20mg  daily, Metop 75mg  BID  CKD 3a: recently stopped lasix  Outpatient Encounter Medications as of 09/18/2023  Medication Sig   Cholecalciferol (VITAMIN D PO) Take 5,000 Units by mouth daily.    clobetasol ointment (TEMOVATE) 0.05 % APPLY A THIN LAYER TO TO THE AFFECTED AREA ON THE SKIN TWICE DAILY   diltiazem (CARDIZEM) 90 MG tablet TAKE 1 TABLET BY MOUTH TWICE DAILY   lisinopril (ZESTRIL) 10 MG tablet 10 mg daily. 09/18/23-takes 1/2 tab daily.   metoprolol tartrate (LOPRESSOR) 50 MG tablet Take 1.5 tablets by mouth 2 (two) times daily.   Multiple Vitamins-Minerals (PRESERVISION AREDS PO) Take by mouth daily.   rivaroxaban (XARELTO) 20 MG TABS tablet Take 20 mg by mouth daily with supper.   rosuvastatin (CRESTOR) 40 MG tablet TAKE 1 TABLET BY MOUTH DAILY FOR CHOLESTEROL   zinc gluconate 50 MG tablet Take 50 mg by mouth daily.   [DISCONTINUED] metoprolol (LOPRESSOR) 50 MG tablet Take 50 mg by mouth 2 (two) times daily. Take 1 & 1/2 tablets-09/18/2023   furosemide (LASIX) 40 MG tablet TAKE 1 TABLET BY MOUTH DAILY FOR BLOOD PRESSURE AND FLUID RETENTION OR  ANKLE SWELLING (Patient not taking: Reported on 09/18/2023)   No facility-administered encounter medications on file as of 09/18/2023.    Past Medical History:  Diagnosis Date   Atrial fibrillation (HCC) 03/22/2016   NEW ONSET    Hyperlipidemia    Hypertension    Pre-diabetes    Vitamin D deficiency     Past Surgical History:  Procedure Laterality Date   CARDIAC CATHETERIZATION N/A 03/23/2016   Procedure: Right/Left Heart Cath and Coronary Angiography;  Surgeon: Orpah Cobb, MD;  Location: [facility];  Service: Cardiovascular;  Laterality: N/A;   CARDIOVERSION N/A 07/13/2016   Procedure: CARDIOVERSION;  Surgeon: Orpah Cobb, MD;  Location: [facility];  Service: Cardiovascular;  Laterality: N/A;   CYST FROM WRIST Right    TUBAL LIGATION      Family History  Problem Relation Age of Onset   COPD Father    Heart disease Father    Hypertension Father    Hypertension Brother     Social History   Socioeconomic History   Marital status: Married    Spouse name: Not on file   Number of children: Not on file   Years of education: Not on file   Highest education level: Not on file  Occupational History   Not on file  Tobacco Use   Smoking status: Never   Smokeless tobacco: Never  Substance and Sexual Activity   Alcohol use: Yes    Alcohol/week: 0.0 standard drinks of alcohol    Comment:  social   Drug use: No   Sexual activity: Not on file  Other Topics Concern   Not on file  Social History Narrative   Not on file   Social Drivers of Health   Financial Resource Strain: Not on file  Food Insecurity: Not on file  Transportation Needs: Not on file  Physical Activity: Not on file  Stress: Not on file  Social Connections: Not on file  Intimate Partner Violence: Not on file    Review of Systems  Respiratory: Negative.    Cardiovascular: Negative.   Neurological: Negative.   Psychiatric/Behavioral: Negative.          Objective    BP 136/80   Pulse 64    Temp 97.7 F (36.5 C)   Ht 5\' 6"  (1.676 m)   Wt 166 lb 12.8 oz (75.7 kg)   SpO2 98%   BMI 26.92 kg/m   Physical Exam Vitals and nursing note reviewed.  Constitutional:      Appearance: Normal appearance. She is normal weight.  HENT:     Head: Normocephalic and atraumatic.  Cardiovascular:     Rate and Rhythm: Normal rate. Rhythm irregular.     Pulses: Normal pulses.     Heart sounds: Normal heart sounds.  Pulmonary:     Effort: Pulmonary effort is normal.     Breath sounds: Normal breath sounds.  Skin:    General: Skin is warm and dry.  Neurological:     General: No focal deficit present.     Mental Status: She is alert and oriented to person, place, and time. Mental status is at baseline.  Psychiatric:        Mood and Affect: Mood normal.        Behavior: Behavior normal.        Thought Content: Thought content normal.        Judgment: Judgment normal.         Assessment & Plan:   Problem List Items Addressed This Visit     Essential hypertension   Chronic well controlled on Diltiazem 90mg  BID, Lisinopril 5mg  daily, Metoprolol 75mg  BID, Lasix PRN. Recommend heart healthy diet such as Mediterranean diet with whole grains, fruits, vegetable, fish, lean meats, nuts, and olive oil. Limit salt. Encouraged moderate walking, 3-5 times/week for 30-50 minutes each session. Aim for at least 150 minutes.week. Goal should be pace of 3 miles/hours, or walking 1.5 miles in 30 minutes. Avoid tobacco products. Avoid excess alcohol. Take medications as prescribed and bring medications and blood pressure log with cuff to each office visit. Seek medical care for chest pain, palpitations, shortness of breath with exertion, dizziness/lightheadedness, vision changes, recurrent headaches, or swelling of extremities. Follow up in 6 months      Relevant Medications   metoprolol tartrate (LOPRESSOR) 50 MG tablet   Other Relevant Orders   Comprehensive metabolic panel with GFR    Hyperlipidemia, mixed   Continue Crestor 40mg  daily, non-fasting labs today. I recommend consuming a heart healthy diet such as Mediterranean diet or DASH diet with whole grains, fruits, vegetable, fish, lean meats, nuts, and olive oil. Limit sweets and processed foods. I also encourage moderate intensity exercise 150 minutes weekly. This is 3-5 times weekly for 30-50 minutes each session. Goal should be pace of 3 miles/hours, or walking 1.5 miles in 30 minutes. The 10-year ASCVD risk score (Arnett DK, et al., 2019) is: 11.3%       Relevant Medications   metoprolol tartrate (LOPRESSOR) 50  MG tablet   Other Relevant Orders   Lipid panel   Overweight (BMI 25.0-29.9)   Relevant Orders   CBC with Differential/Platelet   Paroxysmal atrial fibrillation (HCC)   Followed by cardiology. H/o ablation, back in afib. On xarelto, diltiazem, metoprolol. Rate controlled.       Relevant Medications   metoprolol tartrate (LOPRESSOR) 50 MG tablet   Other Relevant Orders   CBC with Differential/Platelet   Prediabetes   A1c today      Relevant Orders   Hemoglobin A1c   Stage 3a chronic kidney disease (HCC)   No offending medications. Avoid NSAIDs. Stay hydrated. Labs today.      Relevant Orders   Comprehensive metabolic panel with GFR   Encounter to establish care with new provider - Primary   Today we reviewed your medical history, health maintenance items, and current concerns. Please schedule your colonoscopy. You declined pneumonia and shingles vaccines, information provided for your consideration. Tdap at next visit or with local pharmacy as we are out of stock today. Non-fasting labs today. It was a pleasure to meet you and I look forward to working with you.      Other Visit Diagnoses       Colon cancer screening       Relevant Orders   Ambulatory referral to Gastroenterology       Return in about 7 months (around 04/19/2024) for annual physical with labs 1 week prior.   Park Meo, FNP

## 2023-09-18 NOTE — Assessment & Plan Note (Signed)
 Continue Crestor 40mg  daily, non-fasting labs today. I recommend consuming a heart healthy diet such as Mediterranean diet or DASH diet with whole grains, fruits, vegetable, fish, lean meats, nuts, and olive oil. Limit sweets and processed foods. I also encourage moderate intensity exercise 150 minutes weekly. This is 3-5 times weekly for 30-50 minutes each session. Goal should be pace of 3 miles/hours, or walking 1.5 miles in 30 minutes. The 10-year ASCVD risk score (Arnett DK, et al., 2019) is: 11.3%

## 2023-09-18 NOTE — Assessment & Plan Note (Signed)
 Today we reviewed your medical history, health maintenance items, and current concerns. Please schedule your colonoscopy. You declined pneumonia and shingles vaccines, information provided for your consideration. Tdap at next visit or with local pharmacy as we are out of stock today. Non-fasting labs today. It was a pleasure to meet you and I look forward to working with you.

## 2023-09-18 NOTE — Assessment & Plan Note (Signed)
-

## 2023-09-18 NOTE — Patient Instructions (Signed)
 It was great to meet you today and I'm excited to have you join the [facility] practice. I hope you had a positive experience today! If you feel so inclined, please feel free to recommend our practice to friends and family. Kurtis Bushman, FNP-C

## 2023-09-18 NOTE — Assessment & Plan Note (Signed)
 No offending medications. Avoid NSAIDs. Stay hydrated. Labs today.

## 2023-09-18 NOTE — Assessment & Plan Note (Addendum)
 Followed by cardiology. H/o ablation, back in afib. On xarelto, diltiazem, metoprolol. Rate controlled.

## 2023-09-19 ENCOUNTER — Encounter: Payer: Self-pay | Admitting: Family Medicine

## 2023-09-19 LAB — LIPID PANEL
Cholesterol: 178 mg/dL (ref <200)
HDL: 57 mg/dL (ref >=50)
LDL Cholesterol (Calc): 99 mg/dL
Non-HDL Cholesterol (Calc): 121 mg/dL (ref <130)
Total CHOL/HDL Ratio: 3.1 (calc) (ref <5.0)
Triglycerides: 121 mg/dL (ref <150)

## 2023-09-19 LAB — COMPREHENSIVE METABOLIC PANEL WITH GFR
AG Ratio: 1.8 (calc) (ref 1.0–2.5)
ALT: 21 U/L (ref 6–29)
AST: 33 U/L (ref 10–35)
Albumin: 4.5 g/dL (ref 3.6–5.1)
Alkaline phosphatase (APISO): 92 U/L (ref 37–153)
BUN/Creatinine Ratio: 18 (calc) (ref 6–22)
BUN: 21 mg/dL (ref 7–25)
CO2: 28 mmol/L (ref 20–32)
Calcium: 10 mg/dL (ref 8.6–10.4)
Chloride: 104 mmol/L (ref 98–110)
Creat: 1.18 mg/dL — ABNORMAL HIGH (ref 0.50–1.05)
Globulin: 2.5 g/dL (ref 1.9–3.7)
Glucose, Bld: 88 mg/dL (ref 65–99)
Potassium: 4.5 mmol/L (ref 3.5–5.3)
Sodium: 140 mmol/L (ref 135–146)
Total Bilirubin: 0.6 mg/dL (ref 0.2–1.2)
Total Protein: 7 g/dL (ref 6.1–8.1)
eGFR: 50 mL/min/{1.73_m2} — ABNORMAL LOW (ref >=60)

## 2023-09-19 LAB — CBC WITH DIFFERENTIAL/PLATELET
Absolute Lymphocytes: 2153 {cells}/uL (ref 850–3900)
Absolute Monocytes: 811 {cells}/uL (ref 200–950)
Basophils Absolute: 31 {cells}/uL (ref 0–200)
Basophils Relative: 0.4 %
Eosinophils Absolute: 109 {cells}/uL (ref 15–500)
Eosinophils Relative: 1.4 %
HCT: 43.6 % (ref 35.0–45.0)
Hemoglobin: 14.8 g/dL (ref 11.7–15.5)
MCH: 32.4 pg (ref 27.0–33.0)
MCHC: 33.9 g/dL (ref 32.0–36.0)
MCV: 95.4 fL (ref 80.0–100.0)
MPV: 11 fL (ref 7.5–12.5)
Monocytes Relative: 10.4 %
Neutro Abs: 4696 {cells}/uL (ref 1500–7800)
Neutrophils Relative %: 60.2 %
Platelets: 189 10*3/uL (ref 140–400)
RBC: 4.57 10*6/uL (ref 3.80–5.10)
RDW: 12.7 % (ref 11.0–15.0)
Total Lymphocyte: 27.6 %
WBC: 7.8 10*3/uL (ref 3.8–10.8)

## 2023-09-19 LAB — HEMOGLOBIN A1C
Hgb A1c MFr Bld: 6 %{Hb} — ABNORMAL HIGH (ref <5.7)
Mean Plasma Glucose: 126 mg/dL
eAG (mmol/L): 7 mmol/L

## 2023-09-26 ENCOUNTER — Other Ambulatory Visit: Payer: Self-pay | Admitting: Family Medicine

## 2023-10-29 ENCOUNTER — Ambulatory Visit: Payer: BC Managed Care – PPO | Admitting: Nurse Practitioner

## 2023-11-04 DIAGNOSIS — I10 Essential (primary) hypertension: Secondary | ICD-10-CM | POA: Diagnosis not present

## 2023-11-04 DIAGNOSIS — I4821 Permanent atrial fibrillation: Secondary | ICD-10-CM | POA: Diagnosis not present

## 2023-11-04 DIAGNOSIS — I5022 Chronic systolic (congestive) heart failure: Secondary | ICD-10-CM | POA: Diagnosis not present

## 2023-11-04 DIAGNOSIS — I342 Nonrheumatic mitral (valve) stenosis: Secondary | ICD-10-CM | POA: Diagnosis not present

## 2023-11-07 DIAGNOSIS — H2513 Age-related nuclear cataract, bilateral: Secondary | ICD-10-CM | POA: Diagnosis not present

## 2023-11-07 DIAGNOSIS — H35073 Retinal telangiectasis, bilateral: Secondary | ICD-10-CM | POA: Diagnosis not present

## 2023-11-07 DIAGNOSIS — H35443 Age-related reticular degeneration of retina, bilateral: Secondary | ICD-10-CM | POA: Diagnosis not present

## 2023-11-07 DIAGNOSIS — H43823 Vitreomacular adhesion, bilateral: Secondary | ICD-10-CM | POA: Diagnosis not present

## 2024-03-30 ENCOUNTER — Other Ambulatory Visit: Payer: Self-pay | Admitting: Family Medicine

## 2024-04-29 ENCOUNTER — Encounter: Payer: BC Managed Care – PPO | Admitting: Nurse Practitioner

## 2024-05-04 ENCOUNTER — Encounter: Payer: Self-pay | Admitting: Family Medicine

## 2024-05-04 ENCOUNTER — Ambulatory Visit: Admitting: Family Medicine

## 2024-05-04 VITALS — BP 135/85 | HR 68 | Temp 98.0°F | Ht 66.0 in | Wt 179.2 lb

## 2024-05-04 DIAGNOSIS — Z0000 Encounter for general adult medical examination without abnormal findings: Secondary | ICD-10-CM | POA: Insufficient documentation

## 2024-05-04 DIAGNOSIS — I10 Essential (primary) hypertension: Secondary | ICD-10-CM

## 2024-05-04 DIAGNOSIS — E559 Vitamin D deficiency, unspecified: Secondary | ICD-10-CM

## 2024-05-04 DIAGNOSIS — R7303 Prediabetes: Secondary | ICD-10-CM

## 2024-05-04 DIAGNOSIS — Z0001 Encounter for general adult medical examination with abnormal findings: Secondary | ICD-10-CM

## 2024-05-04 DIAGNOSIS — E663 Overweight: Secondary | ICD-10-CM | POA: Diagnosis not present

## 2024-05-04 DIAGNOSIS — E782 Mixed hyperlipidemia: Secondary | ICD-10-CM

## 2024-05-04 DIAGNOSIS — N1831 Chronic kidney disease, stage 3a: Secondary | ICD-10-CM

## 2024-05-04 DIAGNOSIS — I480 Paroxysmal atrial fibrillation: Secondary | ICD-10-CM

## 2024-05-04 DIAGNOSIS — Z23 Encounter for immunization: Secondary | ICD-10-CM | POA: Diagnosis not present

## 2024-05-04 MED ORDER — TETANUS-DIPHTH-ACELL PERTUSSIS 5-2-15.5 LF-MCG/0.5 IM SUSP: 0.5000 mL | Freq: Once | INTRAMUSCULAR | 0 refills | Status: DC

## 2024-05-04 NOTE — Assessment & Plan Note (Signed)
 Followed by cardiology. H/o ablation, back in afib. On xarelto, diltiazem, metoprolol. Rate controlled.

## 2024-05-04 NOTE — Assessment & Plan Note (Signed)
-

## 2024-05-04 NOTE — Assessment & Plan Note (Signed)
 Continue Crestor  40mg  daily, non-fasting labs today. I recommend consuming a heart healthy diet such as Mediterranean diet or DASH diet with whole grains, fruits, vegetable, fish, lean meats, nuts, and olive oil. Limit sweets and processed foods. I also encourage moderate intensity exercise 150 minutes weekly. This is 3-5 times weekly for 30-50 minutes each session. Goal should be pace of 3 miles/hours, or walking 1.5 miles in 30 minutes. The 10-year ASCVD risk score (Arnett DK, et al., 2019) is: 12%

## 2024-05-04 NOTE — Assessment & Plan Note (Signed)
 No offending medications. Avoid NSAIDs. Stay hydrated. Labs today.

## 2024-05-04 NOTE — Progress Notes (Signed)
 Complete physical exam  Patient: Madison Whitehead    DOB: [birth year] 68 y.o.   MRN: [id number]  Chief Complaint  Patient presents with   Annual Exam    Subjective:    Madison Whitehead is a 68 y.o. female who presents today for a complete physical exam. She reports consuming a general diet. The patient does not participate in regular exercise at present. She generally feels well. She reports sleeping well. She does not have additional problems to discuss today.   PMH includes HTN, HLD, Afib, CKD 3a, prediabetes Followed by Cardiology and OBGYN  HTN: well controlled on Diltiazem  90mg  BID, Lisinopril 10mg  daily, Metoprolol  75mg  BID Denies chest pain, palpitations, recurrent headaches, vision changes, lightheadedness, dizziness, dyspnea on exertion, or swelling of extremities.   HLD: Crestor  40mg  daily   Afib:  Xarelto  20mg  daily, Metop 75mg  BID   CKD 3a: avoiding NSAIDs and staying hydrated  Discussed the use of AI scribe software for clinical note transcription with the patient, who gave verbal consent to proceed.  History of Present Illness Madison Whitehead is a 68 year old female with atrial fibrillation and hypertension who presents for routine follow-up.  She has no new symptoms since her last visit. No chest pain, palpitations, headaches, vision changes, or shortness of breath. She is concerned about recent weight gain but has not experienced any thyroid  problems.  Her current medications include diltiazem , lisinopril 10 mg daily, metoprolol  75 mg daily, rosuvastatin  40 mg daily, and Xarelto  20 mg daily. She takes Lasix  as needed but has stopped using it recently. She also takes 5000 units of vitamin D  and a multivitamin for women daily.  She has a history of atrial fibrillation and underwent an ablation procedure that lasted about a year before symptoms returned. She has not experienced any pain related to her condition and reports that her medications are working  fine.  She last had a colonoscopy 14 years ago, which revealed one polyp. She is concerned about the risks associated with her blood thinner, Xarelto , during the procedure.  No hearing or vision problems, although she wears glasses. She is up to date with her dental care and plans to have her teeth cleaned soon.  She has some skin concerns, including dryness and sun spots. No numbness or tingling in her arms or legs.   Most recent fall risk assessment:    05/04/2024    9:14 AM  Fall Risk   Falls in the past year? 0  Number falls in past yr: 0  Injury with Fall? 0  Risk for fall due to : No Fall Risks  Follow up Falls evaluation completed     Most recent depression screenings:    05/04/2024    9:14 AM 09/18/2023    2:16 PM  PHQ 2/9 Scores  PHQ - 2 Score 0 0  PHQ- 9 Score 0     Vision:Within last year and Dental: No current dental problems and Receives regular dental care Patient Active Problem List   Diagnosis Date Noted   Physical exam, annual 05/04/2024   Encounter to establish care with new provider 09/18/2023   Stage 3a chronic kidney disease (HCC) 07/06/2020   Paroxysmal atrial fibrillation (HCC) 04/27/2018   Prediabetes 04/27/2018   FHx: heart disease 04/27/2018   Overweight (BMI 25.0-29.9) 07/10/2017   Essential hypertension 08/05/2015   Hyperlipidemia, mixed 08/05/2015   Abnormal glucose 08/05/2015   Vitamin D  deficiency 08/05/2015   Medication management 08/05/2015   Past Medical History:  Diagnosis  Date   Atrial fibrillation (HCC) 03/22/2016   NEW ONSET    Hyperlipidemia    Hypertension    Pre-diabetes    Vitamin D  deficiency    Past Surgical History:  Procedure Laterality Date   CARDIAC CATHETERIZATION N/A 03/23/2016   Procedure: Right/Left Heart Cath and Coronary Angiography;  Surgeon: Salena Negri, MD;  Location: [facility];  Service: Cardiovascular;  Laterality: N/A;   CARDIOVERSION N/A 07/13/2016   Procedure: CARDIOVERSION;  Surgeon: Salena Negri, MD;  Location: [facility];  Service: Cardiovascular;  Laterality: N/A;   CYST FROM WRIST Right    TUBAL LIGATION     Social History   Tobacco Use   Smoking status: Never   Smokeless tobacco: Never  Vaping Use   Vaping status: Never Used  Substance Use Topics   Alcohol use: Yes    Alcohol/week: 0.0 standard drinks of alcohol    Comment: social   Drug use: No   Family History  Problem Relation Age of Onset   Pneumonia Mother    COPD Father    Heart disease Father    Hypertension Father    Hypertension Brother    Heart disease Brother    Allergies  Allergen Reactions   Codeine     REACTION: TREMORS/NERVOUS   Penicillins Itching    REACTION: RASH/SWELLING      Patient Care Team: Kayla Jeoffrey RAMAN, FNP as PCP - General (Family Medicine) Negri Salena, MD as Consulting Physician (Cardiology)   Review of Systems  Constitutional: Negative.   HENT: Negative.    Eyes: Negative.   Respiratory: Negative.    Cardiovascular: Negative.   Gastrointestinal: Negative.   Genitourinary: Negative.   Musculoskeletal: Negative.   Skin: Negative.   Neurological: Negative.   Endo/Heme/Allergies: Negative.   Psychiatric/Behavioral: Negative.    All other systems reviewed and are negative.     Objective:    BP 135/85   Pulse 68   Temp 98 F (36.7 C)   Ht 5' 6 (1.676 m)   Wt 179 lb 3.2 oz (81.3 kg)   SpO2 99%   BMI 28.92 kg/m  BP Readings from Last 3 Encounters:  05/04/24 135/85  09/18/23 136/80  04/30/23 112/80   Wt Readings from Last 3 Encounters:  05/04/24 179 lb 3.2 oz (81.3 kg)  09/18/23 166 lb 12.8 oz (75.7 kg)  04/30/23 163 lb 3.2 oz (74 kg)      Physical Exam Vitals and nursing note reviewed.  Constitutional:      Appearance: Normal appearance. She is normal weight.  HENT:     Head: Normocephalic and atraumatic.     Right Ear: Tympanic membrane, ear canal and external ear normal.     Left Ear: Tympanic membrane, ear canal and external ear  normal.     Nose: Nose normal.     Mouth/Throat:     Mouth: Mucous membranes are moist.     Pharynx: Oropharynx is clear.  Eyes:     Extraocular Movements: Extraocular movements intact.     Conjunctiva/sclera: Conjunctivae normal.     Pupils: Pupils are equal, round, and reactive to light.  Cardiovascular:     Rate and Rhythm: Normal rate. Rhythm irregular.     Pulses: Normal pulses.     Heart sounds: Normal heart sounds.  Pulmonary:     Effort: Pulmonary effort is normal.     Breath sounds: Normal breath sounds.  Abdominal:     General: Bowel sounds are normal.  Palpations: Abdomen is soft.  Musculoskeletal:        General: Normal range of motion.     Cervical back: Normal range of motion and neck supple.  Skin:    General: Skin is warm and dry.     Capillary Refill: Capillary refill takes less than 2 seconds.  Neurological:     General: No focal deficit present.     Mental Status: She is alert and oriented to person, place, and time. Mental status is at baseline.  Psychiatric:        Mood and Affect: Mood normal.        Behavior: Behavior normal.        Thought Content: Thought content normal.        Judgment: Judgment normal.       No results found for any visits on 05/04/24. Last CBC Lab Results  Component Value Date   WBC 7.8 09/18/2023   HGB 14.8 09/18/2023   HCT 43.6 09/18/2023   MCV 95.4 09/18/2023   MCH 32.4 09/18/2023   RDW 12.7 09/18/2023   PLT 189 09/18/2023   Last metabolic panel Lab Results  Component Value Date   GLUCOSE 88 09/18/2023   NA 140 09/18/2023   K 4.5 09/18/2023   CL 104 09/18/2023   CO2 28 09/18/2023   BUN 21 09/18/2023   CREATININE 1.18 (H) 09/18/2023   EGFR 50 (L) 09/18/2023   CALCIUM  10.0 09/18/2023   PROT 7.0 09/18/2023   ALBUMIN 4.6 01/10/2017   BILITOT 0.6 09/18/2023   ALKPHOS 109 01/10/2017   AST 33 09/18/2023   ALT 21 09/18/2023   ANIONGAP 8 07/13/2016   Last lipids Lab Results  Component Value Date   CHOL  178 09/18/2023   HDL 57 09/18/2023   LDLCALC 99 09/18/2023   TRIG 121 09/18/2023   CHOLHDL 3.1 09/18/2023   Last hemoglobin A1c Lab Results  Component Value Date   HGBA1C 6.0 (H) 09/18/2023   Last thyroid  functions Lab Results  Component Value Date   TSH 2.79 04/30/2023   Last vitamin D  Lab Results  Component Value Date   VD25OH 69 04/30/2023   Last vitamin B12 and Folate Lab Results  Component Value Date   VITAMINB12 754 03/31/2020         Assessment & Plan:    Routine Health Maintenance and Physical Exam Immunization History  Administered Date(s) Administered   Moderna Sars-Covid-2 Vaccination 07/24/2020   PPD Test 08/05/2015, 09/05/2016, 10/01/2017, 03/05/2019   Tdap 09/19/2010   Unspecified SARS-COV-2 Vaccination 08/21/2020    Health Maintenance  Topic Date Due   Colonoscopy  09/07/2019   DTaP/Tdap/Td (2 - Td or Tdap) 09/18/2020   COVID-19 Vaccine (3 - Mixed Product risk series) 05/20/2024 (Originally 09/18/2020)   Zoster Vaccines- Shingrix (1 of 2) 08/04/2024 (Originally [birth year]/1974)   Influenza Vaccine  09/15/2024 (Originally 01/17/2024)   Pneumococcal Vaccine: 50+ Years (1 of 2 - PCV) 09/17/2024 (Originally 04[birth year]11/1974)   Mammogram  06/26/2025   DEXA SCAN  Completed   Hepatitis C Screening  Completed   Meningococcal B Vaccine  Aged Out    Discussed health benefits of physical activity, and encouraged her to engage in regular exercise appropriate for her age and condition.  Problem List Items Addressed This Visit     Essential hypertension   Chronic well controlled on Diltiazem  90mg  BID, Lisinopril 5mg  daily, Metoprolol  75mg  BID. Recommend heart healthy diet such as Mediterranean diet with whole grains, fruits, vegetable, fish, lean meats, nuts, and  olive oil. Limit salt. Encouraged moderate walking, 3-5 times/week for 30-50 minutes each session. Aim for at least 150 minutes.week. Goal should be pace of 3 miles/hours, or walking 1.5 miles in 30 minutes.  Avoid tobacco products. Avoid excess alcohol. Take medications as prescribed and bring medications and blood pressure log with cuff to each office visit. Seek medical care for chest pain, palpitations, shortness of breath with exertion, dizziness/lightheadedness, vision changes, recurrent headaches, or swelling of extremities. Follow up in 6 months      Relevant Orders   CBC with Differential/Platelet   Comprehensive metabolic panel with GFR   Lipid panel   Hemoglobin A1c   VITAMIN D  25 Hydroxy (Vit-D Deficiency, Fractures)   Hyperlipidemia, mixed   Continue Crestor  40mg  daily, non-fasting labs today. I recommend consuming a heart healthy diet such as Mediterranean diet or DASH diet with whole grains, fruits, vegetable, fish, lean meats, nuts, and olive oil. Limit sweets and processed foods. I also encourage moderate intensity exercise 150 minutes weekly. This is 3-5 times weekly for 30-50 minutes each session. Goal should be pace of 3 miles/hours, or walking 1.5 miles in 30 minutes. The 10-year ASCVD risk score (Arnett DK, et al., 2019) is: 12%       Relevant Orders   Lipid panel   Vitamin D  deficiency   Vitamin D  level today      Relevant Orders   VITAMIN D  25 Hydroxy (Vit-D Deficiency, Fractures)   Overweight (BMI 25.0-29.9)   Relevant Orders   CBC with Differential/Platelet   Comprehensive metabolic panel with GFR   Lipid panel   Hemoglobin A1c   VITAMIN D  25 Hydroxy (Vit-D Deficiency, Fractures)   Paroxysmal atrial fibrillation (HCC)   Followed by cardiology. H/o ablation, back in afib. On xarelto , diltiazem , metoprolol . Rate controlled.       Prediabetes   A1c today      Relevant Orders   Hemoglobin A1c   Stage 3a chronic kidney disease (HCC)   No offending medications. Avoid NSAIDs. Stay hydrated. Labs today.      Physical exam, annual - Primary   Today your medical history was reviewed and routine physical exam with labs was performed. Recommend 150 minutes of  moderate intensity exercise weekly and consuming a well-balanced diet. Advised to stop smoking if a smoker, avoid smoking if a non-smoker, limit alcohol consumption to 1 drink per day for women and 2 drinks per day for men, and avoid illicit drug use. Vaccine maintenance discussed. Appropriate health maintenance items reviewed. Return to office in 1 year for annual physical exam.  - TDap today, declined shingles - Will schedule colonoscopy - Advised DEXA          Return in about 6 months (around 11/01/2024) for chronic follow-up with labs 1 week prior.    Jeoffrey GORMAN Barrio, FNP [facility]

## 2024-05-04 NOTE — Assessment & Plan Note (Signed)
 Vitamin D level today

## 2024-05-04 NOTE — Assessment & Plan Note (Addendum)
 Today your medical history was reviewed and routine physical exam with labs was performed. Recommend 150 minutes of moderate intensity exercise weekly and consuming a well-balanced diet. Advised to stop smoking if a smoker, avoid smoking if a non-smoker, limit alcohol consumption to 1 drink per day for women and 2 drinks per day for men, and avoid illicit drug use. Vaccine maintenance discussed. Appropriate health maintenance items reviewed. Return to office in 1 year for annual physical exam.  - TDap today, declined shingles - Will schedule colonoscopy - Advised DEXA

## 2024-05-04 NOTE — Assessment & Plan Note (Signed)
 Chronic well controlled on Diltiazem  90mg  BID, Lisinopril 5mg  daily, Metoprolol  75mg  BID. Recommend heart healthy diet such as Mediterranean diet with whole grains, fruits, vegetable, fish, lean meats, nuts, and olive oil. Limit salt. Encouraged moderate walking, 3-5 times/week for 30-50 minutes each session. Aim for at least 150 minutes.week. Goal should be pace of 3 miles/hours, or walking 1.5 miles in 30 minutes. Avoid tobacco products. Avoid excess alcohol. Take medications as prescribed and bring medications and blood pressure log with cuff to each office visit. Seek medical care for chest pain, palpitations, shortness of breath with exertion, dizziness/lightheadedness, vision changes, recurrent headaches, or swelling of extremities. Follow up in 6 months

## 2024-05-05 ENCOUNTER — Ambulatory Visit: Payer: Self-pay | Admitting: Family Medicine

## 2024-05-05 LAB — LIPID PANEL
Cholesterol: 170 mg/dL (ref <200)
HDL: 63 mg/dL (ref >=50)
LDL Cholesterol (Calc): 87 mg/dL
Non-HDL Cholesterol (Calc): 107 mg/dL (ref <130)
Total CHOL/HDL Ratio: 2.7 (calc) (ref <5.0)
Triglycerides: 100 mg/dL (ref <150)

## 2024-05-05 LAB — CBC WITH DIFFERENTIAL/PLATELET
Absolute Lymphocytes: 1871 {cells}/uL (ref 850–3900)
Absolute Monocytes: 718 {cells}/uL (ref 200–950)
Basophils Absolute: 19 {cells}/uL (ref 0–200)
Basophils Relative: 0.3 %
Eosinophils Absolute: 107 {cells}/uL (ref 15–500)
Eosinophils Relative: 1.7 %
HCT: 44.2 % (ref 35.0–45.0)
Hemoglobin: 14.7 g/dL (ref 11.7–15.5)
MCH: 32.8 pg (ref 27.0–33.0)
MCHC: 33.3 g/dL (ref 32.0–36.0)
MCV: 98.7 fL (ref 80.0–100.0)
MPV: 10.9 fL (ref 7.5–12.5)
Monocytes Relative: 11.4 %
Neutro Abs: 3585 {cells}/uL (ref 1500–7800)
Neutrophils Relative %: 56.9 %
Platelets: 178 Thousand/uL (ref 140–400)
RBC: 4.48 Million/uL (ref 3.80–5.10)
RDW: 12.6 % (ref 11.0–15.0)
Total Lymphocyte: 29.7 %
WBC: 6.3 Thousand/uL (ref 3.8–10.8)

## 2024-05-05 LAB — COMPREHENSIVE METABOLIC PANEL WITH GFR
AG Ratio: 1.6 (calc) (ref 1.0–2.5)
ALT: 18 U/L (ref 6–29)
AST: 29 U/L (ref 10–35)
Albumin: 4.6 g/dL (ref 3.6–5.1)
Alkaline phosphatase (APISO): 93 U/L (ref 37–153)
BUN/Creatinine Ratio: 16 (calc) (ref 6–22)
BUN: 18 mg/dL (ref 7–25)
CO2: 26 mmol/L (ref 20–32)
Calcium: 10.2 mg/dL (ref 8.6–10.4)
Chloride: 106 mmol/L (ref 98–110)
Creat: 1.16 mg/dL — ABNORMAL HIGH (ref 0.50–1.05)
Globulin: 2.9 g/dL (ref 1.9–3.7)
Glucose, Bld: 95 mg/dL (ref 65–99)
Potassium: 4.6 mmol/L (ref 3.5–5.3)
Sodium: 142 mmol/L (ref 135–146)
Total Bilirubin: 0.6 mg/dL (ref 0.2–1.2)
Total Protein: 7.5 g/dL (ref 6.1–8.1)
eGFR: 51 mL/min/1.73m2 — ABNORMAL LOW (ref >=60)

## 2024-05-05 LAB — HEMOGLOBIN A1C
Hgb A1c MFr Bld: 5.8 % — ABNORMAL HIGH (ref <5.7)
Mean Plasma Glucose: 120 mg/dL
eAG (mmol/L): 6.6 mmol/L

## 2024-05-05 LAB — VITAMIN D 25 HYDROXY (VIT D DEFICIENCY, FRACTURES): Vit D, 25-Hydroxy: 71 ng/mL (ref 30–100)

## 2024-05-19 DIAGNOSIS — M79671 Pain in right foot: Secondary | ICD-10-CM | POA: Diagnosis not present

## 2024-06-02 DIAGNOSIS — M79671 Pain in right foot: Secondary | ICD-10-CM | POA: Diagnosis not present

## 2024-07-03 ENCOUNTER — Telehealth: Payer: Self-pay | Admitting: Family Medicine

## 2024-07-03 NOTE — Telephone Encounter (Signed)
 Patient has a referral to GI for colonoscopy. I do not see that it has been scheduled. LMOM for patient to call back so we can give her information to call and schedule.

## 2024-10-30 ENCOUNTER — Other Ambulatory Visit

## 2024-11-02 ENCOUNTER — Ambulatory Visit: Admitting: Family Medicine
